# Patient Record
Sex: Male | Born: 1965
Health system: Southern US, Community
[De-identification: ages and names within clinical notes are randomized; demographics above are authoritative.]

## PROBLEM LIST (undated history)

## (undated) DIAGNOSIS — K449 Diaphragmatic hernia without obstruction or gangrene: Secondary | ICD-10-CM

## (undated) DIAGNOSIS — K219 Gastro-esophageal reflux disease without esophagitis: Secondary | ICD-10-CM

## (undated) DIAGNOSIS — E785 Hyperlipidemia, unspecified: Secondary | ICD-10-CM

## (undated) DIAGNOSIS — D126 Benign neoplasm of colon, unspecified: Secondary | ICD-10-CM

## (undated) DIAGNOSIS — I10 Essential (primary) hypertension: Secondary | ICD-10-CM

## (undated) DIAGNOSIS — K5790 Diverticulosis of intestine, part unspecified, without perforation or abscess without bleeding: Secondary | ICD-10-CM

## (undated) HISTORY — PX: KNEE ARTHROPLASTY: SHX992

## (undated) HISTORY — DX: Hyperlipidemia, unspecified: E78.5

## (undated) HISTORY — DX: Gastro-esophageal reflux disease without esophagitis: K21.9

## (undated) HISTORY — DX: Essential (primary) hypertension: I10

## (undated) HISTORY — DX: Benign neoplasm of colon, unspecified: D12.6

## (undated) HISTORY — DX: Diaphragmatic hernia without obstruction or gangrene: K44.9

## (undated) HISTORY — DX: Diverticulosis of intestine, part unspecified, without perforation or abscess without bleeding: K57.90

## (undated) HISTORY — PX: TOTAL KNEE ARTHROPLASTY: SHX125

---

## 2004-10-14 ENCOUNTER — Ambulatory Visit: Payer: Self-pay | Admitting: Gastroenterology

## 2004-11-27 ENCOUNTER — Ambulatory Visit (HOSPITAL_COMMUNITY): Admission: RE | Admit: 2004-11-27 | Discharge: 2004-11-27 | Payer: Self-pay | Admitting: Internal Medicine

## 2005-10-10 ENCOUNTER — Encounter: Admission: RE | Admit: 2005-10-10 | Discharge: 2005-12-11 | Payer: Self-pay | Admitting: Family Medicine

## 2008-07-08 ENCOUNTER — Encounter: Payer: Self-pay | Admitting: Family Medicine

## 2010-04-29 ENCOUNTER — Telehealth (INDEPENDENT_AMBULATORY_CARE_PROVIDER_SITE_OTHER): Payer: Self-pay | Admitting: *Deleted

## 2010-04-29 ENCOUNTER — Encounter (INDEPENDENT_AMBULATORY_CARE_PROVIDER_SITE_OTHER): Payer: Self-pay | Admitting: *Deleted

## 2010-06-05 ENCOUNTER — Ambulatory Visit: Payer: Self-pay | Admitting: Family Medicine

## 2010-06-13 ENCOUNTER — Emergency Department: Payer: Self-pay | Admitting: Emergency Medicine

## 2010-07-21 ENCOUNTER — Ambulatory Visit: Payer: Self-pay | Admitting: Family Medicine

## 2010-07-21 DIAGNOSIS — E669 Obesity, unspecified: Secondary | ICD-10-CM | POA: Insufficient documentation

## 2010-07-21 DIAGNOSIS — I10 Essential (primary) hypertension: Secondary | ICD-10-CM | POA: Insufficient documentation

## 2010-07-21 DIAGNOSIS — N521 Erectile dysfunction due to diseases classified elsewhere: Secondary | ICD-10-CM

## 2010-07-21 DIAGNOSIS — E7800 Pure hypercholesterolemia, unspecified: Secondary | ICD-10-CM | POA: Insufficient documentation

## 2010-07-21 DIAGNOSIS — E1159 Type 2 diabetes mellitus with other circulatory complications: Secondary | ICD-10-CM | POA: Insufficient documentation

## 2010-07-28 ENCOUNTER — Ambulatory Visit: Payer: Self-pay | Admitting: Family Medicine

## 2010-08-04 LAB — CONVERTED CEMR LAB
ALT: 19 units/L (ref 0–53)
Albumin: 4.2 g/dL (ref 3.5–5.2)
Basophils Relative: 0.3 % (ref 0.0–3.0)
Calcium: 9.1 mg/dL (ref 8.4–10.5)
Cholesterol: 201 mg/dL — ABNORMAL HIGH (ref 0–200)
Creatinine,U: 154.5 mg/dL
Direct LDL: 143.9 mg/dL
Eosinophils Relative: 3.9 % (ref 0.0–5.0)
GFR calc non Af Amer: 145.3 mL/min (ref >60)
Glucose, Bld: 136 mg/dL — ABNORMAL HIGH (ref 70–99)
HCT: 40.9 % (ref 39.0–52.0)
HDL: 51.7 mg/dL (ref >39.00)
Hemoglobin: 13.7 g/dL (ref 13.0–17.0)
Hgb A1c MFr Bld: 7 % — ABNORMAL HIGH (ref 4.6–6.5)
Lymphocytes Relative: 32.3 % (ref 12.0–46.0)
Lymphs Abs: 2.7 10*3/uL (ref 0.7–4.0)
Microalb, Ur: 3.1 mg/dL — ABNORMAL HIGH (ref 0.0–1.9)
Monocytes Relative: 8.9 % (ref 3.0–12.0)
Neutro Abs: 4.5 10*3/uL (ref 1.4–7.7)
Potassium: 4.7 meq/L (ref 3.5–5.1)
RBC: 4.42 M/uL (ref 4.22–5.81)
Sodium: 143 meq/L (ref 135–145)
Total Bilirubin: 0.5 mg/dL (ref 0.3–1.2)
Total Protein: 7.1 g/dL (ref 6.0–8.3)
Triglycerides: 46 mg/dL (ref 0.0–149.0)
WBC: 8.3 10*3/uL (ref 4.5–10.5)

## 2010-10-20 ENCOUNTER — Ambulatory Visit: Payer: Self-pay | Admitting: Family Medicine

## 2010-10-20 LAB — CONVERTED CEMR LAB
ALT: 26 units/L (ref 0–53)
Alkaline Phosphatase: 92 units/L (ref 39–117)
Bilirubin, Direct: 0.1 mg/dL (ref 0.0–0.3)
Cholesterol: 186 mg/dL (ref 0–200)
LDL Cholesterol: 127 mg/dL — ABNORMAL HIGH (ref 0–99)
Total Bilirubin: 0.6 mg/dL (ref 0.3–1.2)
Total Protein: 6.8 g/dL (ref 6.0–8.3)
VLDL: 6 mg/dL (ref 0.0–40.0)

## 2011-01-13 NOTE — Miscellaneous (Signed)
Summary: [facility]  [facility]   Imported By: Lanelle Bal 08/09/2010 08:01:13  _____________________________________________________________________  External Attachment:    Type:   Image     Comment:   External Document

## 2011-01-13 NOTE — Assessment & Plan Note (Signed)
Summary: NEW PT TO ESTABH/DLO   Vital Signs:  Patient profile:   45 year old male Height:      69 inches Weight:      261.4 pounds BMI:     38.74 Temp:     99.1 degrees F oral Pulse rate:   72 / minute Pulse rhythm:   regular BP sitting:   140 / 82  (left arm) Cuff size:   regular  Vitals Entered By: Benny Lennert CMA Duncan Dull) (July 21, 2010 1:58 PM)  History of Present Illness: Chief complaint new patient to be established   DM: as below, blood sugars were approximately 500,  Lantus was titrated up by Dr. Patsy Lager at [facility], now he is doing the best he is done in years  HTN: Currently not at goal. Tolerating lisinopril easily  Lipidsreportedly does have  hyperlipidemia, but not currently on medication.  Obesity, approaching 300 pounds  was urinating a lot, dm was out of control.  was using the bathroom, about thirty times.  500 bs, on sixty units. Doig fantastic.  Checks bs about every day   ROS: as above, urination has improved, no  fever or chills, or nausea. Feeling very good. no cp, sob. Otherwise, the pertinent positives and negatives are listed above and in the HPI, otherwise a full review of systems has been reviewed and is negative unless noted positive.   Preventive Screening-Counseling & Management  Alcohol-Tobacco     Smoking Status: never  Allergies (verified): No Known Drug Allergies  Past History:  Past Medical History: HYPERCHOLESTEROLEMIA (ICD-272.0) HYPERTENSION (ICD-401.9) DM (ICD-250.00)    Past Surgical History: none  Family History: Family History Diabetes 1st degree relative Family History Hypertension  Social History: Occupation: Married Never Smoked Occupation:  employed Smoking Status:  never  Physical Exam  General:  Well-developed,well-nourished,in no acute distress; alert,appropriate and cooperative throughout examination, obese Head:  Normocephalic and atraumatic without obvious abnormalities. No apparent alopecia or  balding. Ears:  no external deformities.   Nose:  no external deformity.   Mouth:  Oral mucosa and oropharynx without lesions or exudates.  Teeth in good repair. Neck:  No deformities, masses, or tenderness noted. Chest Wall:  No deformities, masses, tenderness or gynecomastia noted. Lungs:  Normal respiratory effort, chest expands symmetrically. Lungs are clear to auscultation, no crackles or wheezes. Heart:  Normal rate and regular rhythm. S1 and S2 normal without gallop, murmur, click, rub or other extra sounds. Extremities:  No clubbing, cyanosis, edema, or deformity noted with normal full range of motion of all joints.   Neurologic:  alert & oriented X3 and gait normal.   Cervical Nodes:  No lymphadenopathy noted Psych:  Cognition and judgment appear intact. Alert and cooperative with normal attention span and concentration. No apparent delusions, illusions, hallucinations   Impression & Recommendations:  Problem # 1:  DM (ICD-250.00) Assessment New Continue with Lantus 60 units nightly  His updated medication list for this problem includes:    Metformin Hcl 1000 Mg Tabs (Metformin hcl) .Marland Kitchen... Take on e tablet 2 times daily    Lisinopril 40 Mg Tabs (Lisinopril) ..... Once dialy    Lantus Solostar 100 Unit/ml Soln (Insulin glargine) .Marland Kitchen... 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 Once dialy  Amlodipine Besylate 5 Mg Tabs (Amlodipine besylate) .Marland Kitchen... 1 by mouth every day  Problem # 3:  HYPERCHOLESTEROLEMIA (ICD-272.0) Assessment: New check  Problem # 4:  OBESITY (ICD-278.00) Assessment: New Work on your diet: Try to eat minimal fatty foods, plenty of fiber, fruit, and vegetables. Exercise is incredibly important to heart health: Try to  exercise for at least 30 minutes 5-6 times a week.   Complete Medication List: 1)  Metformin Hcl 1000 Mg Tabs (Metformin hcl) .... Take on e tablet 2 times daily 2)  Lisinopril 40 Mg Tabs (Lisinopril) .... Once dialy 3)  Prilosec 40 Mg Cpdr (Omeprazole) .... One tablet daily 4)  Lantus Solostar 100 Unit/ml Soln (Insulin glargine) .... Give 100 units [place] each night, dispense 1 full month's supply 5)  Amlodipine Besylate 5 Mg Tabs (Amlodipine besylate) .Marland Kitchen.. 1 by mouth every day  Patient Instructions: 1)  LABS 2)  BMP, HFP, FLP, CBC with diff, TSH, PSA:272.4, 250.00, v58.69, v76.44 3)  HgBA1c prior to visit  ICD-9: 250.00 4)  Urine Microalbumin prior to visit ICD-9 : 250.00 5)  FOLLOW-UP 3 MONTHS Prescriptions: AMLODIPINE BESYLATE 5 MG  TABS (AMLODIPINE BESYLATE) 1 by mouth every day  #90 x 3   Entered and Authorized by:   Hannah Beat MD   Signed by:   Hannah Beat MD on 07/21/2010   Method used:   Print then Give to Patient   RxID:   [id number] HYDROCHLOROTHIAZIDE 12.5 MG  TABS (HYDROCHLOROTHIAZIDE) Take 1 tab  by mouth every morning  #90 x 3   Entered and Authorized by:   Hannah Beat MD   Signed by:   Hannah Beat MD on 07/21/2010   Method used:   Print then Give to Patient   RxID:   [id number] LANTUS SOLOSTAR 100 UNIT/ML SOLN (INSULIN GLARGINE) Give 100 units  each night, dispense 1 full month's supply  #1 x 5   Entered and Authorized by:   Hannah Beat MD   Signed by:   Hannah Beat MD on 07/21/2010   Method used:   Print then Give to Patient   RxID:   [id number] PRILOSEC 40 MG CPDR (OMEPRAZOLE) one tablet daily  #90 x 3   Entered and Authorized by:   Hannah Beat MD   Signed by:   Hannah Beat MD on 07/21/2010   Method used:   Print then Give to Patient   RxID:   [id number] LISINOPRIL 40 MG TABS (LISINOPRIL) once dialy  #180 x 3   Entered and Authorized by:   Hannah Beat MD   Signed by:   Hannah Beat MD on 07/21/2010    Method used:   Print then Give to Patient   RxID:   [id number] METFORMIN HCL 1000 MG TABS (METFORMIN HCL) take on e tablet 2 times daily  #180 x 3   Entered and Authorized by:   Hannah Beat MD   Signed by:   Hannah Beat MD on 07/21/2010   Method used:   Print then Give to Patient   RxID:   [id number]   Current Allergies (reviewed today): No known allergies

## 2011-01-13 NOTE — Letter (Signed)
Summary: Records Dated 03-29-07 thru 01-06-10/[facility]  Records Dated 03-29-07 thru 01-06-10/[facility]   Imported By: Lanelle Bal 08/09/2010 08:08:13  _____________________________________________________________________  External Attachment:    Type:   Image     Comment:   External Document

## 2011-01-13 NOTE — Letter (Signed)
Summary: Primary Care Appointment Letter  [place] [facility]  [address] [place], [place] [address]   Phone: [phone]  Fax: [phone]    04/29/2010 MRN: [id number]  NANCY MANUELE [address] [place], [place]  [address]  Dear Mr. Hartfield,   Your Primary Care Physician  has indicated that:    _______it is time to schedule an appointment.    _______you missed your appointment on______ and need to call and          reschedule.    _______you need to have lab work done.    _______you need to schedule an appointment discuss lab or test results.    __**____We have rescheduled the time (only) of your appointment to 2:00pm. The will remain the same August 19,2011.    Please call our office as soon as possible. Our phone number is [phone]          _________. Please press option 1. Our office is open 8a-12noon and 1p-5p, Monday through Friday.     Thank you,    [facility]

## 2011-01-13 NOTE — Progress Notes (Signed)
Summary: Rescheduled New pt appt  Phone Note Outgoing Call   Summary of Call: Called pt left message rescheduling appt time from 9am, to 2:00pm, sent ltr of appt rescheduled.Daine Gip  Apr 29, 2010 10:57 AM  Initial call taken by: Daine Gip,  Apr 29, 2010 10:57 AM

## 2011-02-02 ENCOUNTER — Encounter: Payer: Self-pay | Admitting: Family Medicine

## 2011-02-11 ENCOUNTER — Encounter (INDEPENDENT_AMBULATORY_CARE_PROVIDER_SITE_OTHER): Payer: Self-pay | Admitting: *Deleted

## 2011-02-17 NOTE — Letter (Signed)
Summary: [employer]   Imported By: Kassie Mends 02/10/2011 10:57:06  _____________________________________________________________________  External Attachment:    Type:   Image     Comment:   External Document  Appended Document: [employer] Letter received communication from his insurance  way overdue for CPX schedule in future  Appended Document: [employer] Letter Letter mailed to patient to schedule appt in the near future.Consuello Masse CMA

## 2011-02-17 NOTE — Letter (Signed)
Summary: Generic Letter  [place] [facility]  [address] [place], [place] [address]   Phone: [phone]  Fax: [phone]    02/11/2011     Eric Frederick [address] [place], [place]  [address]    Dear Mr. Stavely,   We received communication from his insurance you are way over due for Physical. Please call and schedule in the near future.    Sincerely,   Benny Lennert CMA (AAMA)

## 2011-02-22 ENCOUNTER — Encounter: Payer: Self-pay | Admitting: Family Medicine

## 2011-02-22 ENCOUNTER — Telehealth (INDEPENDENT_AMBULATORY_CARE_PROVIDER_SITE_OTHER): Payer: Self-pay | Admitting: *Deleted

## 2011-02-23 ENCOUNTER — Other Ambulatory Visit (INDEPENDENT_AMBULATORY_CARE_PROVIDER_SITE_OTHER): Payer: 59

## 2011-02-23 ENCOUNTER — Encounter: Payer: Self-pay | Admitting: Family Medicine

## 2011-02-23 ENCOUNTER — Other Ambulatory Visit: Payer: Self-pay | Admitting: Family Medicine

## 2011-02-23 DIAGNOSIS — E669 Obesity, unspecified: Secondary | ICD-10-CM

## 2011-02-23 DIAGNOSIS — Z79899 Other long term (current) drug therapy: Secondary | ICD-10-CM

## 2011-02-23 DIAGNOSIS — Z125 Encounter for screening for malignant neoplasm of prostate: Secondary | ICD-10-CM

## 2011-02-23 DIAGNOSIS — E119 Type 2 diabetes mellitus without complications: Secondary | ICD-10-CM

## 2011-02-23 LAB — PSA: PSA: 0.94 ng/mL (ref 0.10–4.00)

## 2011-02-23 LAB — CBC WITH DIFFERENTIAL/PLATELET
Basophils Relative: 0.3 % (ref 0.0–3.0)
Eosinophils Absolute: 0.4 10*3/uL (ref 0.0–0.7)
Eosinophils Relative: 5.1 % — ABNORMAL HIGH (ref 0.0–5.0)
HCT: 44 % (ref 39.0–52.0)
Lymphs Abs: 2.9 10*3/uL (ref 0.7–4.0)
MCHC: 33.2 g/dL (ref 30.0–36.0)
MCV: 90.1 fl (ref 78.0–100.0)
Monocytes Absolute: 0.8 10*3/uL (ref 0.1–1.0)
Neutrophils Relative %: 48.7 % (ref 43.0–77.0)
RBC: 4.89 Mil/uL (ref 4.22–5.81)
WBC: 8.1 10*3/uL (ref 4.5–10.5)

## 2011-02-23 LAB — MICROALBUMIN / CREATININE URINE RATIO: Creatinine,U: 154.2 mg/dL

## 2011-03-01 NOTE — Progress Notes (Signed)
----   Converted from flag ---- ---- 02/21/2011 6:14 PM, Hannah Beat MD wrote: no, non-compliant DM 2. hgba1c, microalbumin: 250.00 PSA: v76.44 cbc with diff, v58.69 [we can use all others from 10/2010]  ---- 02/21/2011 10:32 AM, Liane Comber CMA (AAMA) wrote: This pt had his cpx labs Nov 11 (canceled cpx appt) he is scheduled for cpx labs this wed. Do you want to use the labs from Nov and cancel this lab or draw him? Thanks Tasha ------------------------------

## 2011-03-02 ENCOUNTER — Encounter: Payer: Self-pay | Admitting: Family Medicine

## 2011-03-02 ENCOUNTER — Ambulatory Visit (INDEPENDENT_AMBULATORY_CARE_PROVIDER_SITE_OTHER): Payer: 59 | Admitting: Family Medicine

## 2011-03-02 DIAGNOSIS — N529 Male erectile dysfunction, unspecified: Secondary | ICD-10-CM

## 2011-03-02 DIAGNOSIS — I10 Essential (primary) hypertension: Secondary | ICD-10-CM

## 2011-03-02 DIAGNOSIS — E7800 Pure hypercholesterolemia, unspecified: Secondary | ICD-10-CM

## 2011-03-02 DIAGNOSIS — E1169 Type 2 diabetes mellitus with other specified complication: Secondary | ICD-10-CM | POA: Insufficient documentation

## 2011-03-02 DIAGNOSIS — E669 Obesity, unspecified: Secondary | ICD-10-CM

## 2011-03-02 DIAGNOSIS — N521 Erectile dysfunction due to diseases classified elsewhere: Secondary | ICD-10-CM | POA: Insufficient documentation

## 2011-03-02 DIAGNOSIS — E119 Type 2 diabetes mellitus without complications: Secondary | ICD-10-CM

## 2011-03-02 DIAGNOSIS — Z0000 Encounter for general adult medical examination without abnormal findings: Secondary | ICD-10-CM

## 2011-03-02 DIAGNOSIS — R21 Rash and other nonspecific skin eruption: Secondary | ICD-10-CM

## 2011-03-02 MED ORDER — METOPROLOL TARTRATE 25 MG PO TABS: 25.0000 mg | ORAL_TABLET | Freq: Two times a day (BID) | ORAL | Status: DC

## 2011-03-02 MED ORDER — INSULIN GLARGINE 100 UNIT/ML ~~LOC~~ SOLN: 100.0000 [IU] | Freq: Every day | SUBCUTANEOUS | Status: DC

## 2011-03-02 MED ORDER — METFORMIN HCL 1000 MG PO TABS: 1000.0000 mg | ORAL_TABLET | Freq: Two times a day (BID) | ORAL | Status: DC

## 2011-03-02 MED ORDER — TRIAMCINOLONE ACETONIDE 0.1 % EX CREA: TOPICAL_CREAM | Freq: Two times a day (BID) | CUTANEOUS | Status: AC

## 2011-03-02 MED ORDER — LISINOPRIL 40 MG PO TABS: 40.0000 mg | ORAL_TABLET | Freq: Every day | ORAL | Status: DC

## 2011-03-02 MED ORDER — OMEPRAZOLE 40 MG PO CPDR: 40.0000 mg | DELAYED_RELEASE_CAPSULE | Freq: Every day | ORAL | Status: DC

## 2011-03-02 MED ORDER — TADALAFIL 20 MG PO TABS: 20.0000 mg | ORAL_TABLET | Freq: Every day | ORAL | Status: AC | PRN

## 2011-03-02 MED ORDER — PRAVASTATIN SODIUM 10 MG PO TABS: ORAL_TABLET | ORAL | Status: DC

## 2011-03-02 NOTE — Assessment & Plan Note (Signed)
Fairly good control, continue with lantus. 60 units qhs

## 2011-03-02 NOTE — Assessment & Plan Note (Signed)
cialis prn 

## 2011-03-02 NOTE — Assessment & Plan Note (Signed)
Add lopressor, d/c norvasc

## 2011-03-02 NOTE — Assessment & Plan Note (Signed)
TAC prn

## 2011-03-02 NOTE — Assessment & Plan Note (Signed)
The patient is asked to make an attempt to improve diet and exercise patterns to aid in medical management of this problem.  

## 2011-03-02 NOTE — Progress Notes (Signed)
Patient presents for CPX, but also has many medication and problem-focused questions:  CPX: morbidly obese, no exercise, no ETOH, no tobacco. Faithful to wife, no concern for STDS  Lab Results  Component Value Date   HGBA1C 5.9 02/23/2011   Lab Results  Component Value Date   CHOL 186 10/20/2010   CHOL 201* 07/28/2010   Lab Results  Component Value Date   HDL 53.10 10/20/2010   HDL 16.10 07/28/2010   Lab Results  Component Value Date   LDLCALC 127* 10/20/2010   Lab Results  Component Value Date   TRIG 30.0 10/20/2010   TRIG 46.0 07/28/2010   Lab Results  Component Value Date   CHOLHDL 4 10/20/2010   CHOLHDL 4 07/28/2010   Lab Results  Component Value Date   PSA 0.94 02/23/2011   PSA 1.25 07/28/2010   Lab Results  Component Value Date   ALT 26 10/20/2010   AST 19 10/20/2010   ALKPHOS 92 10/20/2010   BILITOT 0.6 10/20/2010   Lab Results  Component Value Date   WBC 8.1 02/23/2011   HGB 14.6 02/23/2011   HCT 44.0 02/23/2011   MCV 90.1 02/23/2011   PLT 301.0 02/23/2011   All reviewed with the patient.  HTN: goal of 130/80 Norvasc, had a lot of urination. Could not tolerate with going to the bathroom with his job. Tol ACE inh  Lipids: Also, simvistatin, had some rash. Had some muscles and joint aches. Crestor - also caught a rash and got some joint aching.   DM: Dm, 60-70 u a1c < 6. No hypoglycemia. No problems with medicine. Eating poorly.  Fatigue: Has questions about thyroid: Lab Results  Component Value Date   TSH 0.74 07/28/2010  normal  Has decreased energy compared to when he wsa younger. Also has some decreased sex drive compared.  Wonders about testosterone level, discussed.  Recent rash: Ketoconazole tablets, has had tinea versicolor in the past Keep triamcinalone. - helped with most recent rash  The PMH, PSH, Social History, Family History, Medications, and allergies have been reviewed in [place], and have been updated if relevant.   General: Denies  fever, chills, sweats. No significant weight loss. FATIGUE AS ABOVE Eyes: Denies blurring,significant itching ENT: Denies earache, sore throat, and hoarseness.  Cardiovascular: Denies chest pains, palpitations, dyspnea on exertion,  Respiratory: Denies cough, dyspnea at rest,wheeezing Breast: no concerns about lumps GI: Denies nausea, vomiting, diarrhea, constipation, change in bowel habits, abdominal pain, melena, hematochezia GU: AS ABOVE Musculoskeletal: Denies back pain, joint pain Derm: ABOVE Neuro: Denies  paresthesias, frequent falls, frequent headaches Psych: Denies depression, anxiety Endocrine: Denies cold intolerance, heat intolerance, polydipsia Heme: Denies enlarged lymph nodes Allergy: No hayfever Otherwise, the pertinent positives and negatives are listed above and in the HPI, otherwise a full review of systems has been reviewed and is negative unless noted positive.   PE: GEN: well developed, well nourished, no acute distress Eyes: conjunctiva and lids normal, PERRLA, EOMI ENT: TM clear, nares clear, oral exam WNL Neck: supple, no lymphadenopathy, no thyromegaly, no JVD Pulm: clear to auscultation and percussion, respiratory effort normal CV: regular rate and rhythm, S1-S2, no murmur, rub or gallop, no bruits, peripheral pulses normal and symmetric, no cyanosis, clubbing, edema or varicosities Chest: no scars, masses, no gynecomastia   GI: soft, non-tender; no hepatosplenomegaly, masses; active bowel sounds all quadrants GU: no hernia, testicular mass, penile discharge, priapism or prostate enlargement Lymph: no cervical, axillary or inguinal adenopathy MSK: gait normal, muscle tone and strength  WNL, no joint swelling, effusions, discoloration, crepitus  SKIN: clear, good turgor, color WNL, no rashes, lesions, or ulcerations Neuro: normal mental status, normal strength, sensation, and motion Psych: alert; oriented to person, place and time, normally interactive and not  anxious or depressed in appearance.

## 2011-03-02 NOTE — Assessment & Plan Note (Signed)
Not at goal, low dose pravastatin Recheck 3 mo

## 2011-03-02 NOTE — Patient Instructions (Signed)
Recheck in 3 months Recheck BP and new meds

## 2011-03-02 NOTE — Assessment & Plan Note (Signed)
The patient's preventative maintenance and recommended screening tests for an annual wellness exam were reviewed in full today. Brought up to date unless services declined.  Counselled on the importance of diet, exercise, and its role in overall health and mortality. The patient's FH and SH was reviewed, including their home life, tobacco status, and drug and alcohol status.    

## 2011-06-01 ENCOUNTER — Ambulatory Visit: Payer: 59 | Admitting: Family Medicine

## 2011-12-21 ENCOUNTER — Other Ambulatory Visit: Payer: Self-pay | Admitting: Family Medicine

## 2012-03-06 ENCOUNTER — Other Ambulatory Visit: Payer: Self-pay | Admitting: *Deleted

## 2012-03-06 MED ORDER — INSULIN GLARGINE 100 UNIT/ML ~~LOC~~ SOLN: SUBCUTANEOUS | Status: DC

## 2012-03-06 MED ORDER — INSULIN PEN NEEDLE 31G X 8 MM MISC: 2.0000 [IU] | Freq: Two times a day (BID) | Status: DC

## 2012-03-14 ENCOUNTER — Encounter: Payer: Self-pay | Admitting: Family Medicine

## 2012-03-14 ENCOUNTER — Ambulatory Visit (INDEPENDENT_AMBULATORY_CARE_PROVIDER_SITE_OTHER): Payer: 59 | Admitting: Family Medicine

## 2012-03-14 VITALS — BP 140/80 | HR 100 | Temp 98.9°F | Ht 69.0 in | Wt 295.1 lb

## 2012-03-14 DIAGNOSIS — E7800 Pure hypercholesterolemia, unspecified: Secondary | ICD-10-CM

## 2012-03-14 DIAGNOSIS — Z91199 Patient's noncompliance with other medical treatment and regimen due to unspecified reason: Secondary | ICD-10-CM | POA: Insufficient documentation

## 2012-03-14 DIAGNOSIS — Z79899 Other long term (current) drug therapy: Secondary | ICD-10-CM

## 2012-03-14 DIAGNOSIS — I10 Essential (primary) hypertension: Secondary | ICD-10-CM

## 2012-03-14 DIAGNOSIS — E119 Type 2 diabetes mellitus without complications: Secondary | ICD-10-CM

## 2012-03-14 DIAGNOSIS — Z9119 Patient's noncompliance with other medical treatment and regimen: Secondary | ICD-10-CM

## 2012-03-14 LAB — CBC WITH DIFFERENTIAL/PLATELET
Basophils Relative: 0.2 % (ref 0.0–3.0)
Eosinophils Absolute: 0.4 10*3/uL (ref 0.0–0.7)
Eosinophils Relative: 4.6 % (ref 0.0–5.0)
HCT: 45.7 % (ref 39.0–52.0)
Lymphs Abs: 2.2 10*3/uL (ref 0.7–4.0)
MCHC: 32.3 g/dL (ref 30.0–36.0)
MCV: 89.6 fl (ref 78.0–100.0)
Monocytes Absolute: 0.8 10*3/uL (ref 0.1–1.0)
Neutrophils Relative %: 58.6 % (ref 43.0–77.0)
RBC: 5.11 Mil/uL (ref 4.22–5.81)
WBC: 8.1 10*3/uL (ref 4.5–10.5)

## 2012-03-14 LAB — HEPATIC FUNCTION PANEL
ALT: 24 U/L (ref 0–53)
Albumin: 4 g/dL (ref 3.5–5.2)
Bilirubin, Direct: 0 mg/dL (ref 0.0–0.3)
Total Protein: 7.1 g/dL (ref 6.0–8.3)

## 2012-03-14 LAB — MICROALBUMIN / CREATININE URINE RATIO
Creatinine,U: 111.9 mg/dL
Microalb Creat Ratio: 0.8 mg/g (ref 0.0–30.0)
Microalb, Ur: 0.9 mg/dL (ref 0.0–1.9)

## 2012-03-14 LAB — BASIC METABOLIC PANEL
BUN: 15 mg/dL (ref 6–23)
CO2: 26 mEq/L (ref 19–32)
Chloride: 102 mEq/L (ref 96–112)
Creatinine, Ser: 0.8 mg/dL (ref 0.4–1.5)
Potassium: 4.8 mEq/L (ref 3.5–5.1)

## 2012-03-14 LAB — LIPID PANEL
HDL: 41.5 mg/dL (ref >39.00)
Total CHOL/HDL Ratio: 5
VLDL: 11.8 mg/dL (ref 0.0–40.0)

## 2012-03-14 MED ORDER — METOPROLOL TARTRATE 25 MG PO TABS: 25.0000 mg | ORAL_TABLET | Freq: Two times a day (BID) | ORAL | Status: DC

## 2012-03-14 MED ORDER — INSULIN GLARGINE 100 UNIT/ML ~~LOC~~ SOLN: SUBCUTANEOUS | Status: DC

## 2012-03-14 MED ORDER — INSULIN PEN NEEDLE 31G X 8 MM MISC: 2.0000 [IU] | Freq: Two times a day (BID) | Status: DC

## 2012-03-14 MED ORDER — OMEPRAZOLE 40 MG PO CPDR: 40.0000 mg | DELAYED_RELEASE_CAPSULE | Freq: Every day | ORAL | Status: DC

## 2012-03-14 MED ORDER — LISINOPRIL 40 MG PO TABS: 40.0000 mg | ORAL_TABLET | Freq: Every day | ORAL | Status: DC

## 2012-03-14 MED ORDER — METFORMIN HCL 1000 MG PO TABS: 1000.0000 mg | ORAL_TABLET | Freq: Two times a day (BID) | ORAL | Status: DC

## 2012-03-14 NOTE — Progress Notes (Signed)
Patient Name: Eric Frederick Date of Birth: 29-Dec-1965 Medical Record Number: [id number] Gender: male Date of Encounter: 03/14/2012  History of Present Illness:  Eric Frederick is a 46 y.o. very pleasant male patient who presents with the following:  DM: metf 1000 bid Lantus 70 units 2 packs a month  Diabetes Mellitus: Tolerating Medications: yes Compliance with diet: poor Exercise: none Avg blood sugars at home: ok Foot problems: none Hypoglycemia: none No nausea, vomitting, blurred vision, polyuria.  Lab Results  Component Value Date   HGBA1C 7.1* 03/14/2012    Wt Readings from Last 3 Encounters:  03/14/12 295 lb 1.9 oz (133.866 kg)  03/02/11 275 lb (124.739 kg)  07/21/10 261 lb 6.4 oz (118.57 kg)    Body mass index is 43.58 kg/(m^2).  HTN: not at goal, had some urination with metoprolol No CP, no sob. No HA.  BP Readings from Last 3 Encounters:  03/14/12 140/80  03/02/11 140/88  07/21/10 140/82    Basic Metabolic Panel:    Component Value Date/Time   NA 140 03/14/2012 0905   K 4.8 03/14/2012 0905   CL 102 03/14/2012 0905   CO2 26 03/14/2012 0905   BUN 15 03/14/2012 0905   CREATININE 0.8 03/14/2012 0905   GLUCOSE 146* 03/14/2012 0905   CALCIUM 9.1 03/14/2012 0905    Lipids: Could not tolerate even low dose statin  Lipids:    Component Value Date/Time   CHOL 201* 03/14/2012 0905   TRIG 59.0 03/14/2012 0905   HDL 41.50 03/14/2012 0905   LDLDIRECT 156.0 03/14/2012 0905   VLDL 11.8 03/14/2012 0905   CHOLHDL 5 03/14/2012 0905    Lab Results  Component Value Date   ALT 24 03/14/2012   AST 15 03/14/2012   ALKPHOS 97 03/14/2012   BILITOT 0.5 03/14/2012   Body mass index is 43.58 kg/(m^2).  Patient Active Problem List  Diagnoses  . DM  . HYPERCHOLESTEROLEMIA  . OBESITY  . HYPERTENSION  . Routine general medical examination at a health care facility  . Erectile dysfunction  . Rash   Past Medical History  Diagnosis Date  . Diabetes mellitus   . Hyperlipidemia   .  Hypertension    No past surgical history on file. History  Substance Use Topics  . Smoking status: Former Smoker    Types: Cigarettes    Quit date: 03/02/1991  . Smokeless tobacco: Not on file  . Alcohol Use: Not on file   No family history on file. No Known Allergies  Medication list has been reviewed and updated.  Review of Systems:  GEN: No acute illnesses, no fevers, chills. GI: No n/v/d, eating normally Pulm: No SOB Interactive and getting along well at home.  Otherwise, ROS is as per the HPI.   Physical Examination: Filed Vitals:   03/14/12 0816  BP: 140/80  Pulse: 100  Temp: 98.9 F (37.2 C)  TempSrc: Oral  Height: 5\' 9"  (1.753 m)  Weight: 295 lb 1.9 oz (133.866 kg)  SpO2: 98%    Body mass index is 43.58 kg/(m^2).   GEN: WDWN, NAD, Non-toxic, A & O x 3 HEENT: Atraumatic, Normocephalic. Neck supple. No masses, No LAD. Ears and Nose: No external deformity. CV: RRR, No M/G/R. No JVD. No thrill. No extra heart sounds. PULM: CTA B, no wheezes, crackles, rhonchi. No retractions. No resp. distress. No accessory muscle use. EXTR: No c/c/e NEURO Normal gait.  PSYCH: Normally interactive. Conversant. Not depressed or anxious appearing.  Calm demeanor.  Assessment and Plan:  1. DM  Hemoglobin A1c, Basic metabolic panel, Microalbumin / creatinine urine ratio  2. HYPERCHOLESTEROLEMIA  Lipid panel  3. HYPERTENSION    4. Encounter for long-term (current) use of other medications  CBC with Differential, Hepatic function panel  5. History of noncompliance with medical treatment     Restart bblocker Refilled all meds  Work on diet and exercise  Orders Today: Orders Placed This Encounter  Procedures  . Hemoglobin A1c  . Basic metabolic panel  . CBC with Differential  . Hepatic function panel  . Lipid panel  . Microalbumin / creatinine urine ratio  . LDL cholesterol, direct    Medications Today: Meds ordered this encounter  Medications  . DISCONTD:  metFORMIN (GLUCOPHAGE) 1000 MG tablet    Sig: Take 2,000 mg by mouth 2 (two) times daily with a meal.  . metoprolol tartrate (LOPRESSOR) 25 MG tablet    Sig: Take 1 tablet (25 mg total) by mouth 2 (two) times daily.    Dispense:  60 tablet    Refill:  5  . insulin glargine (LANTUS SOLOSTAR) 100 UNIT/ML injection    Sig: INJECT 100 UNITS SUBCUTANEOUSLY AT BEDTIME    Dispense:  90 mL    Refill:  1    Needs appt before further refills  . Insulin Pen Needle (RELION PEN NEEDLE 31G/8MM) 31G X 8 MM MISC    Sig: Inject 2 Units into the skin 2 (two) times daily. Inject 2 Units into the skin Twice daily.    Dispense:  90 each    Refill:  3  . lisinopril (PRINIVIL,ZESTRIL) 40 MG tablet    Sig: Take 1 tablet (40 mg total) by mouth daily.    Dispense:  90 tablet    Refill:  1  . metFORMIN (GLUCOPHAGE) 1000 MG tablet    Sig: Take 1 tablet (1,000 mg total) by mouth 2 (two) times daily with a meal.    Dispense:  180 tablet    Refill:  1  . omeprazole (PRILOSEC) 40 MG capsule    Sig: Take 1 capsule (40 mg total) by mouth daily.    Dispense:  180 capsule    Refill:  3

## 2012-03-14 NOTE — Patient Instructions (Signed)
Recheck 6 months

## 2012-03-15 ENCOUNTER — Encounter: Payer: Self-pay | Admitting: *Deleted

## 2012-06-21 ENCOUNTER — Encounter: Payer: Self-pay | Admitting: Gastroenterology

## 2012-09-12 ENCOUNTER — Encounter: Payer: Self-pay | Admitting: Family Medicine

## 2012-09-12 ENCOUNTER — Ambulatory Visit (INDEPENDENT_AMBULATORY_CARE_PROVIDER_SITE_OTHER): Payer: 59 | Admitting: Family Medicine

## 2012-09-12 VITALS — BP 168/78 | HR 100 | Temp 98.5°F | Resp 20 | Ht 69.0 in | Wt 296.2 lb

## 2012-09-12 DIAGNOSIS — E669 Obesity, unspecified: Secondary | ICD-10-CM

## 2012-09-12 DIAGNOSIS — E7800 Pure hypercholesterolemia, unspecified: Secondary | ICD-10-CM

## 2012-09-12 DIAGNOSIS — B360 Pityriasis versicolor: Secondary | ICD-10-CM

## 2012-09-12 DIAGNOSIS — Z23 Encounter for immunization: Secondary | ICD-10-CM

## 2012-09-12 DIAGNOSIS — E119 Type 2 diabetes mellitus without complications: Secondary | ICD-10-CM

## 2012-09-12 DIAGNOSIS — I10 Essential (primary) hypertension: Secondary | ICD-10-CM

## 2012-09-12 LAB — HEMOGLOBIN A1C: Hgb A1c MFr Bld: 7.6 % — ABNORMAL HIGH (ref 4.6–6.5)

## 2012-09-12 MED ORDER — OMEPRAZOLE 40 MG PO CPDR: 40.0000 mg | DELAYED_RELEASE_CAPSULE | Freq: Every day | ORAL | Status: DC

## 2012-09-12 MED ORDER — KETOCONAZOLE 200 MG PO TABS: ORAL_TABLET | ORAL | Status: DC

## 2012-09-12 MED ORDER — METFORMIN HCL 1000 MG PO TABS: 1000.0000 mg | ORAL_TABLET | Freq: Two times a day (BID) | ORAL | Status: DC

## 2012-09-12 MED ORDER — INSULIN PEN NEEDLE 31G X 6 MM MISC: Status: DC

## 2012-09-12 MED ORDER — INSULIN GLARGINE 100 UNIT/ML ~~LOC~~ SOLN: SUBCUTANEOUS | Status: DC

## 2012-09-12 MED ORDER — AMLODIPINE BESYLATE 5 MG PO TABS: 5.0000 mg | ORAL_TABLET | Freq: Every day | ORAL | Status: DC

## 2012-09-12 MED ORDER — LISINOPRIL 40 MG PO TABS: 40.0000 mg | ORAL_TABLET | Freq: Every day | ORAL | Status: DC

## 2012-09-12 MED ORDER — PRAVASTATIN SODIUM 10 MG PO TABS: ORAL_TABLET | ORAL | Status: DC

## 2012-09-12 NOTE — Patient Instructions (Addendum)
F/u 6 months for CPX 

## 2012-09-12 NOTE — Progress Notes (Signed)
Nature conservation officer at [facility] [address] [place] [address] Phone: [phone] Fax: [phone]  Date:  09/12/2012   Name:  Eric Frederick   DOB:  [birth year]   MRN:  [id number] Gender: male Age: 46 y.o.  PCP:  Hannah Beat, MD    Chief Complaint: Follow-up   History of Present Illness:  Eric Frederick is a 46 y.o. pleasant patient who presents with the following:  Wt Readings from Last 3 Encounters:  09/12/12 296 lb 4 oz (134.378 kg)  03/14/12 295 lb 1.9 oz (133.866 kg)  03/02/11 275 lb (124.739 kg)   Ran into an issue with the meoprolol, when in the --- feels like a little groggy  HTN: increased and not at goal D/c his toprol No CP, no sob. No HA.  BP Readings from Last 3 Encounters:  09/12/12 168/78  03/14/12 140/80  03/02/11 140/88    Basic Metabolic Panel:    Component Value Date/Time   NA 140 03/14/2012 0905   K 4.8 03/14/2012 0905   CL 102 03/14/2012 0905   CO2 26 03/14/2012 0905   BUN 15 03/14/2012 0905   CREATININE 0.8 03/14/2012 0905   GLUCOSE 146* 03/14/2012 0905   CALCIUM 9.1 03/14/2012 0905     Lipids: Doing well, stable. Tolerating meds fine with no SE. Tolerating low dose pravachol now Panel reviewed with patient.  Lipids:    Component Value Date/Time   CHOL 201* 03/14/2012 0905   TRIG 59.0 03/14/2012 0905   HDL 41.50 03/14/2012 0905   LDLDIRECT 156.0 03/14/2012 0905   VLDL 11.8 03/14/2012 0905   CHOLHDL 5 03/14/2012 0905    Lab Results  Component Value Date   ALT 24 03/14/2012   AST 15 03/14/2012   ALKPHOS 97 03/14/2012   BILITOT 0.5 03/14/2012   Diabetes Mellitus: Tolerating Medications: yes Compliance with diet: hit or miss Exercise: rare Avg blood sugars at home: "good" Foot problems: none Hypoglycemia: none No nausea, vomitting, blurred vision, polyuria.  Lab Results  Component Value Date   HGBA1C 7.1* 03/14/2012    Wt Readings from Last 3 Encounters:  09/12/12 296 lb 4 oz (134.378 kg)  03/14/12 295 lb 1.9 oz (133.866 kg)    03/02/11 275 lb (124.739 kg)    Body mass index is 43.75 kg/(m^2).   Patient Active Problem List  Diagnosis  . DM  . HYPERCHOLESTEROLEMIA  . OBESITY  . HYPERTENSION  . Erectile dysfunction  . Rash  . History of noncompliance with medical treatment    Past Medical History  Diagnosis Date  . Diabetes mellitus   . Hyperlipidemia   . Hypertension     No past surgical history on file.  History  Substance Use Topics  . Smoking status: Former Smoker    Types: Cigarettes    Quit date: 03/02/1991  . Smokeless tobacco: Not on file  . Alcohol Use: Not on file    No family history on file.  No Known Allergies  Medication list has been reviewed and updated.  Outpatient Prescriptions Prior to Visit  Medication Sig Dispense Refill  . insulin glargine (LANTUS SOLOSTAR) 100 UNIT/ML injection INJECT 100 UNITS SUBCUTANEOUSLY AT BEDTIME  90 mL  1  . Insulin Pen Needle (RELION PEN NEEDLE 31G/8MM) 31G X 8 MM MISC Inject 2 Units into the skin 2 (two) times daily. Inject 2 Units into the skin Twice daily.  90 each  3  . lisinopril (PRINIVIL,ZESTRIL) 40 MG tablet Take 1 tablet (40  mg total) by mouth daily.  90 tablet  1  . metFORMIN (GLUCOPHAGE) 1000 MG tablet Take 1 tablet (1,000 mg total) by mouth 2 (two) times daily with a meal.  180 tablet  1  . metoprolol tartrate (LOPRESSOR) 25 MG tablet Take 1 tablet (25 mg total) by mouth 2 (two) times daily.  60 tablet  5  . omeprazole (PRILOSEC) 40 MG capsule Take 1 capsule (40 mg total) by mouth daily.  180 capsule  3  . pravastatin (PRAVACHOL) 10 MG tablet 1/2 tab po daily  45 tablet  3    Review of Systems:   GEN: No acute illnesses, no fevers, chills. GI: No n/v/d, eating normally Pulm: No SOB Interactive and getting along well at home.  Otherwise, ROS is as per the HPI.   Physical Examination: Filed Vitals:   09/12/12 0806  BP: 168/78  Pulse: 100  Temp: 98.5 F (36.9 C)  Resp: 20   Filed Vitals:   09/12/12 0806  Height:  5\' 9"  (1.753 m)  Weight: 296 lb 4 oz (134.378 kg)   Body mass index is 43.75 kg/(m^2). Ideal Body Weight: Weight in (lb) to have BMI = 25: 168.9    GEN: WDWN, NAD, Non-toxic, A & O x 3 HEENT: Atraumatic, Normocephalic. Neck supple. No masses, No LAD. Ears and Nose: No external deformity. CV: RRR, No M/G/R. No JVD. No thrill. No extra heart sounds. PULM: CTA B, no wheezes, crackles, rhonchi. No retractions. No resp. distress. No accessory muscle use. EXTR: No c/c/e NEURO Normal gait.  PSYCH: Normally interactive. Conversant. Not depressed or anxious appearing.  Calm demeanor.    Assessment and Plan:  1. Need for prophylactic vaccination and inoculation against influenza  Flu vaccine greater than or equal to 3yo preservative free IM  2. DM  insulin glargine (LANTUS SOLOSTAR) 100 UNIT/ML injection, lisinopril (PRINIVIL,ZESTRIL) 40 MG tablet, Hemoglobin A1c  3. HYPERTENSION : d/c lopressor and start norvasc lisinopril (PRINIVIL,ZESTRIL) 40 MG tablet  4. OBESITY    5. HYPERCHOLESTEROLEMIA    6. Tinea versicolor , nizoral    Refill all meds  Orders Today:  Orders Placed This Encounter  Procedures  . Flu vaccine greater than or equal to 3yo preservative free IM  . Hemoglobin A1c    Updated Medication List: (Includes new medications, updates to list, dose adjustments) Meds ordered this encounter  Medications  . amLODipine (NORVASC) 5 MG tablet    Sig: Take 1 tablet (5 mg total) by mouth daily.    Dispense:  90 tablet    Refill:  3  . ketoconazole (NIZORAL) 200 MG tablet    Sig: 2 tabs po once then repeat 2 tabs in 1 week    Dispense:  4 tablet    Refill:  3  . insulin glargine (LANTUS SOLOSTAR) 100 UNIT/ML injection    Sig: INJECT 100 UNITS SUBCUTANEOUSLY AT BEDTIME    Dispense:  90 mL    Refill:  1    Needs appt before further refills  . lisinopril (PRINIVIL,ZESTRIL) 40 MG tablet    Sig: Take 1 tablet (40 mg total) by mouth daily.    Dispense:  90 tablet    Refill:  1    . omeprazole (PRILOSEC) 40 MG capsule    Sig: Take 1 capsule (40 mg total) by mouth daily.    Dispense:  180 capsule    Refill:  3  . metFORMIN (GLUCOPHAGE) 1000 MG tablet    Sig: Take 1 tablet (1,000 mg  total) by mouth 2 (two) times daily with a meal.    Dispense:  180 tablet    Refill:  3  . pravastatin (PRAVACHOL) 10 MG tablet    Sig: 1/2 tab po daily    Dispense:  45 tablet    Refill:  3  . Insulin Pen Needle 31G X 6 MM MISC    Sig: Inject insulin once daily as directed subcutaneous    Dispense:  100 each    Refill:  5    Medications Discontinued: Medications Discontinued During This Encounter  Medication Reason  . metoprolol tartrate (LOPRESSOR) 25 MG tablet   . insulin glargine (LANTUS SOLOSTAR) 100 UNIT/ML injection Reorder  . lisinopril (PRINIVIL,ZESTRIL) 40 MG tablet Reorder  . omeprazole (PRILOSEC) 40 MG capsule Reorder  . metFORMIN (GLUCOPHAGE) 1000 MG tablet Reorder  . pravastatin (PRAVACHOL) 10 MG tablet Reorder  . Insulin Pen Needle (RELION PEN NEEDLE 31G/8MM) 31G X 8 MM MISC      Hannah Beat, MD

## 2012-09-18 ENCOUNTER — Encounter: Payer: Self-pay | Admitting: *Deleted

## 2012-10-29 ENCOUNTER — Observation Stay: Payer: Self-pay | Admitting: Internal Medicine

## 2012-10-29 LAB — COMPREHENSIVE METABOLIC PANEL
Alkaline Phosphatase: 110 U/L (ref 50–136)
BUN: 13 mg/dL (ref 7–18)
Bilirubin,Total: 0.9 mg/dL (ref 0.2–1.0)
Co2: 26 mmol/L (ref 21–32)
Creatinine: 0.91 mg/dL (ref 0.60–1.30)
EGFR (Non-African Amer.): >60
Osmolality: 280 (ref 275–301)
Potassium: 3.4 mmol/L — ABNORMAL LOW (ref 3.5–5.1)
SGOT(AST): 35 U/L (ref 15–37)
SGPT (ALT): 45 U/L (ref 12–78)

## 2012-10-29 LAB — LIPASE, BLOOD: Lipase: 89 U/L (ref 73–393)

## 2012-10-29 LAB — URINALYSIS, COMPLETE
Leukocyte Esterase: NEGATIVE
Nitrite: NEGATIVE
Ph: 5 (ref 4.5–8.0)
Protein: 30
RBC,UR: 1 /HPF (ref 0–5)

## 2012-10-29 LAB — CBC WITH DIFFERENTIAL/PLATELET
Basophil #: 0.2 10*3/uL — ABNORMAL HIGH (ref 0.0–0.1)
Basophil %: 2 %
HCT: 42.7 % (ref 40.0–52.0)
Lymphocyte #: 0.3 10*3/uL — ABNORMAL LOW (ref 1.0–3.6)
Lymphocyte %: 3.5 %
MCHC: 32.8 g/dL (ref 32.0–36.0)
MCV: 87 fL (ref 80–100)
Monocyte %: 4.5 %
Neutrophil #: 8 10*3/uL — ABNORMAL HIGH (ref 1.4–6.5)
Platelet: 242 10*3/uL (ref 150–440)
RDW: 13.7 % (ref 11.5–14.5)
WBC: 8.9 10*3/uL (ref 3.8–10.6)

## 2012-10-29 LAB — TROPONIN I: Troponin-I: 0.02 ng/mL

## 2012-10-29 LAB — RAPID INFLUENZA A&B ANTIGENS

## 2012-11-03 LAB — CULTURE, BLOOD (SINGLE)

## 2012-12-12 HISTORY — PX: SHOULDER ARTHROSCOPY W/ ROTATOR CUFF REPAIR: SHX2400

## 2012-12-22 ENCOUNTER — Emergency Department: Payer: Self-pay | Admitting: Emergency Medicine

## 2013-03-13 ENCOUNTER — Ambulatory Visit (INDEPENDENT_AMBULATORY_CARE_PROVIDER_SITE_OTHER)
Admission: RE | Admit: 2013-03-13 | Discharge: 2013-03-13 | Disposition: A | Discharge status: Home / Self Care | Payer: 59 | Source: Ambulatory Visit | Attending: Family Medicine | Admitting: Family Medicine

## 2013-03-13 ENCOUNTER — Ambulatory Visit (INDEPENDENT_AMBULATORY_CARE_PROVIDER_SITE_OTHER): Payer: 59 | Admitting: Family Medicine

## 2013-03-13 ENCOUNTER — Ambulatory Visit: Payer: 59 | Admitting: Family Medicine

## 2013-03-13 ENCOUNTER — Encounter: Payer: Self-pay | Admitting: Family Medicine

## 2013-03-13 VITALS — BP 134/80 | HR 105 | Temp 98.3°F | Ht 69.0 in | Wt 290.8 lb

## 2013-03-13 DIAGNOSIS — Z79899 Other long term (current) drug therapy: Secondary | ICD-10-CM

## 2013-03-13 DIAGNOSIS — E119 Type 2 diabetes mellitus without complications: Secondary | ICD-10-CM

## 2013-03-13 DIAGNOSIS — M25519 Pain in unspecified shoulder: Secondary | ICD-10-CM

## 2013-03-13 DIAGNOSIS — B360 Pityriasis versicolor: Secondary | ICD-10-CM

## 2013-03-13 DIAGNOSIS — M25511 Pain in right shoulder: Secondary | ICD-10-CM

## 2013-03-13 DIAGNOSIS — E7800 Pure hypercholesterolemia, unspecified: Secondary | ICD-10-CM

## 2013-03-13 DIAGNOSIS — I10 Essential (primary) hypertension: Secondary | ICD-10-CM

## 2013-03-13 DIAGNOSIS — Z23 Encounter for immunization: Secondary | ICD-10-CM

## 2013-03-13 LAB — CBC WITH DIFFERENTIAL/PLATELET
Basophils Relative: 0.2 % (ref 0.0–3.0)
Eosinophils Relative: 2.3 % (ref 0.0–5.0)
HCT: 44.7 % (ref 39.0–52.0)
Hemoglobin: 14.5 g/dL (ref 13.0–17.0)
Lymphocytes Relative: 29.3 % (ref 12.0–46.0)
Lymphs Abs: 2.2 10*3/uL (ref 0.7–4.0)
Monocytes Relative: 8.5 % (ref 3.0–12.0)
Neutro Abs: 4.5 10*3/uL (ref 1.4–7.7)
RBC: 5.11 Mil/uL (ref 4.22–5.81)
WBC: 7.5 10*3/uL (ref 4.5–10.5)

## 2013-03-13 LAB — BASIC METABOLIC PANEL
Calcium: 9 mg/dL (ref 8.4–10.5)
GFR: 163.57 mL/min (ref >60.00)
Glucose, Bld: 164 mg/dL — ABNORMAL HIGH (ref 70–99)
Potassium: 4.2 mEq/L (ref 3.5–5.1)
Sodium: 137 mEq/L (ref 135–145)

## 2013-03-13 LAB — MICROALBUMIN / CREATININE URINE RATIO
Creatinine,U: 130.2 mg/dL
Microalb Creat Ratio: 1.9 mg/g (ref 0.0–30.0)
Microalb, Ur: 2.5 mg/dL — ABNORMAL HIGH (ref 0.0–1.9)

## 2013-03-13 MED ORDER — INSULIN GLARGINE 100 UNIT/ML ~~LOC~~ SOLN: SUBCUTANEOUS | Status: DC

## 2013-03-13 MED ORDER — FLUCONAZOLE 200 MG PO TABS: ORAL_TABLET | ORAL | Status: DC

## 2013-03-13 NOTE — Progress Notes (Signed)
Nature conservation officer at [facility] [address] [place] [address] Phone: [phone] Fax: [phone]  Date:  03/13/2013   Name:  Eric Frederick   DOB:  Dec 29, 1965   MRN:  [id number] Gender: male Age: 47 y.o.  Primary Physician:  Hannah Beat, MD  Evaluating MD: Hannah Beat, MD   Chief Complaint: Follow-up   History of Present Illness:  AZEL GUMINA is a 47 y.o. pleasant patient who presents with the following:  DM, HTN, Lipids  Pneumovax Tdap  Sugar has been doing OK, sometimes not compliant with diet.  Feels good now.   Diabetes Mellitus: Tolerating Medications: yes Compliance with diet: fair-poor Exercise: no Foot problems: none Hypoglycemia: none No nausea, vomitting, blurred vision, polyuria.  Lab Results  Component Value Date   HGBA1C 7.6* 09/12/2012    Wt Readings from Last 3 Encounters:  03/13/13 290 lb 12 oz (131.883 kg)  09/12/12 296 lb 4 oz (134.378 kg)  03/14/12 295 lb 1.9 oz (133.866 kg)    Body mass index is 42.92 kg/(m^2).   DM eye exam, yearly.  Tinea Versicolor: ketoconazole worked ok  HTN: Tolerating all medications without side effects Stable and at goal No CP, no sob. No HA.  BP Readings from Last 3 Encounters:  03/13/13 134/80  09/12/12 168/78  03/14/12 140/80    Basic Metabolic Panel:    Component Value Date/Time   NA 140 03/14/2012 0905   K 4.8 03/14/2012 0905   CL 102 03/14/2012 0905   CO2 26 03/14/2012 0905   BUN 15 03/14/2012 0905   CREATININE 0.8 03/14/2012 0905   GLUCOSE 146* 03/14/2012 0905   CALCIUM 9.1 03/14/2012 0905     Lipids: Could not tolerate 5 mg pravachol  Lipids:    Component Value Date/Time   CHOL 201* 03/14/2012 0905   TRIG 59.0 03/14/2012 0905   HDL 41.50 03/14/2012 0905   LDLDIRECT 156.0 03/14/2012 0905   VLDL 11.8 03/14/2012 0905   CHOLHDL 5 03/14/2012 0905    Lab Results  Component Value Date   ALT 24 03/14/2012   AST 15 03/14/2012   ALKPHOS 97 03/14/2012   BILITOT 0.5 03/14/2012    The  patient noted above presents with shoulder pain that has been ongoing for 4 months.  The patient denies neck pain or radicular symptoms. Denies dislocation, subluxation, separation of the shoulder. The patient does complain of pain in the overhead plane, but minimally able to abduct shoulder now.  Had one incident in December, was doing some stuff in the garage, and was lifting stuff off of the shelf. And noticed that his right arm was bothering him a lot, wiith cutting. Went to the ER - [facility].  Bringing down and felt like pulled or strained something.    Medications Tried: Advil, allege Ice or Heat: yes Tried PT: No  Prior shoulder Injury: No Prior surgery: No Prior fracture: No  Handedness: RHD    Patient Active Problem List  Diagnosis  . DM  . HYPERCHOLESTEROLEMIA  . OBESITY  . HYPERTENSION  . Erectile dysfunction  . History of noncompliance with medical treatment  . Tinea versicolor    Past Medical History  Diagnosis Date  . Diabetes mellitus   . Hyperlipidemia   . Hypertension     No past surgical history on file.  History   Social History  . Marital Status: Married    Spouse Name: N/A    Number of Children: N/A  . Years of  Education: N/A   Occupational History  . Not on file.   Social History Main Topics  . Smoking status: Former Smoker    Types: Cigarettes    Quit date: 03/02/1991  . Smokeless tobacco: Not on file  . Alcohol Use: Not on file  . Drug Use: Not on file  . Sexually Active: Not on file   Other Topics Concern  . Not on file   Social History Narrative  . No narrative on file    No family history on file.  No Known Allergies  Medication list has been reviewed and updated.  Outpatient Prescriptions Prior to Visit  Medication Sig Dispense Refill  . amLODipine (NORVASC) 5 MG tablet Take 1 tablet (5 mg total) by mouth daily.  90 tablet  3  . insulin glargine (LANTUS SOLOSTAR) 100 UNIT/ML injection INJECT 100 UNITS  SUBCUTANEOUSLY AT BEDTIME  90 mL  1  . Insulin Pen Needle 31G X 6 MM MISC Inject insulin once daily as directed subcutaneous  100 each  5  . ketoconazole (NIZORAL) 200 MG tablet 2 tabs po once then repeat 2 tabs in 1 week  4 tablet  3  . lisinopril (PRINIVIL,ZESTRIL) 40 MG tablet Take 1 tablet (40 mg total) by mouth daily.  90 tablet  1  . metFORMIN (GLUCOPHAGE) 1000 MG tablet Take 1 tablet (1,000 mg total) by mouth 2 (two) times daily with a meal.  180 tablet  3  . omeprazole (PRILOSEC) 40 MG capsule Take 1 capsule (40 mg total) by mouth daily.  180 capsule  3  . pravastatin (PRAVACHOL) 10 MG tablet 1/2 tab po daily  45 tablet  3   No facility-administered medications prior to visit.    Review of Systems:   GEN: No acute illnesses, no fevers, chills. GI: No n/v/d, eating normally Pulm: No SOB Interactive and getting along well at home. Otherwise, the pertinent positives and negatives are listed above and in the HPI, otherwise a full review of systems has been reviewed and is negative unless noted positive.   Physical Examination: BP 134/80  Pulse 105  Temp(Src) 98.3 F (36.8 C) (Oral)  Ht 5\' 9"  (1.753 m)  Wt 290 lb 12 oz (131.883 kg)  BMI 42.92 kg/m2  SpO2 95%  Ideal Body Weight: Weight in (lb) to have BMI = 25: 168.9   GEN: WDWN, NAD, Non-toxic, A & O x 3 HEENT: Atraumatic, Normocephalic. Neck supple. No masses, No LAD. Ears and Nose: No external deformity. CV: RRR, No M/G/R. No JVD. No thrill. No extra heart sounds. PULM: CTA B, no wheezes, crackles, rhonchi. No retractions. No resp. distress. No accessory muscle use. EXTR: No c/c/e NEURO Normal gait.  PSYCH: Normally interactive. Conversant. Not depressed or anxious appearing.  Calm demeanor.    Shoulder: R Inspection: No muscle wasting or winging Ecchymosis/edema: neg  AC joint, scapula, clavicle: NT Cervical spine: NT, full ROM Spurling's: neg Abduction: first 20 deg 4/5, unable to actively abduct beyond 30 deg.  1/5 str, full passive rom Flexion: full, 2/5 IR, full, lift-off: 5/5 ER at neutral: full, 5/5 AC crossover and compression: neg Neer: neg Hawkins: neg Drop Test: CLEARLY POSITIVE ON THE RIGHT Empty Can: N/A Supraspinatus insertion: NT Bicipital groove: NT Speed's: neg Yergason's: neg Sulcus sign: neg Scapular dyskinesis: MARKED WITH ATTEMPTED ABDUCTION ON R C5-T1 intact Sensation intact Grip 5/5  Dg Shoulder Right  03/13/2013  *RADIOLOGY REPORT*  Clinical Data: Right shoulder pain.  Concern for rotator cuff tear  RIGHT  SHOULDER - 2+ VIEW  Comparison: None.  Findings: Negative for fracture.  The alignment is normal.  There is narrowing of the acromiohumeral distance leading to impingement of the rotator cuff.  There is degenerative change and spurring of the [facility] joint which could contribute to rotator cuff impingement.  No rib lesion.  IMPRESSION: Rotator cuff impingement anatomy.  No acute bony abnormality.   Original Report Authenticated By: Janeece Riggers, M.D.     Assessment and Plan:  DM - Plan: insulin glargine (LANTUS SOLOSTAR) 100 UNIT/ML injection, Basic metabolic panel, Hemoglobin A1c, Microalbumin / creatinine urine ratio  Right shoulder pain - Plan: DG Shoulder Right, MR Shoulder Right Wo Contrast  HYPERTENSION  HYPERCHOLESTEROLEMIA - Plan: LDL cholesterol, direct  Encounter for long-term (current) use of other medications - Plan: CBC with Differential  Immunization due - Plan: Tdap vaccine greater than or equal to 7yo IM, Pneumococcal polysaccharide vaccine 23-valent greater than or equal to 2yo subcutaneous/IM  Tinea versicolor  >40 minutes spent in face to face time with patient, >50% spent in counselling or coordination of care: multiple ongoing problems.   DM, refill lantus, check a1c, microalbumin HTN: stable Lipids: unable to tol statin, discussed risks Tinea Versicolor: different antifungal trial  R shoulder: clinical concern for R sided supraspinatus tear  in a patient with a clearly positive drop test, markedly altered shoulder mechanics who cannot actively abduct his arm. Obtain an MRI of the R shoulder to evaluate for full thickness rotator cuff tear.  Orders Today:  Orders Placed This Encounter  Procedures  . DG Shoulder Right    Standing Status: Future     Number of Occurrences: 1     Standing Expiration Date: 05/13/2014    Order Specific Question:  Preferred imaging location?    Answer:  [facility]    Order Specific Question:  Reason for exam:    Answer:  Right shoulder pain, concern for RTC tear  . MR Shoulder Right Wo Contrast    290 LBS/NOT CLAUS OPEN UNIT DO TO WEIGHT/NO NEEDS INS [employer]/SM/EPIC ORDER SCHEDULED W/[facility]    Standing Status: Future     Number of Occurrences:      Standing Expiration Date: 05/13/2014    Order Specific Question:  Does the patient have a pacemaker, internal devices, implants, aneury    Answer:  No    Order Specific Question:  Preferred imaging location?    Answer:  [id number]    Order Specific Question:  Reason for exam:    Answer:  Clinical concern for right rotator cuff tear, injury 11/2013  . Tdap vaccine greater than or equal to 7yo IM  . Pneumococcal polysaccharide vaccine 23-valent greater than or equal to 2yo subcutaneous/IM  . Basic metabolic panel  . CBC with Differential  . LDL cholesterol, direct  . Hemoglobin A1c  . Microalbumin / creatinine urine ratio    Updated Medication List: (Includes new medications, updates to list, dose adjustments) Meds ordered this encounter  Medications  . fluconazole (DIFLUCAN) 200 MG tablet    Sig: 1 po once a week    Dispense:  12 tablet    Refill:  0  . insulin glargine (LANTUS SOLOSTAR) 100 UNIT/ML injection    Sig: INJECT 100 UNITS SUBCUTANEOUSLY AT BEDTIME    Dispense:  90 mL    Refill:  1    Medications Discontinued: Medications Discontinued During This Encounter  Medication Reason  . pravastatin (PRAVACHOL) 10 MG tablet    . ketoconazole (NIZORAL)  200 MG tablet   . insulin glargine (LANTUS SOLOSTAR) 100 UNIT/ML injection Reorder      Signed, Yancarlos Berthold T. Tilia Faso, MD 03/13/2013 9:43 AM

## 2013-03-13 NOTE — Patient Instructions (Addendum)
REFERRAL: GO THE THE FRONT ROOM AT THE ENTRANCE OF OUR CLINIC, NEAR CHECK IN. ASK FOR Eric Frederick. SHE WILL HELP YOU SET UP YOUR REFERRAL. DATE: TIME:  

## 2013-03-14 ENCOUNTER — Ambulatory Visit: Payer: 59 | Admitting: Family Medicine

## 2013-03-14 ENCOUNTER — Encounter: Payer: Self-pay | Admitting: *Deleted

## 2013-03-20 ENCOUNTER — Ambulatory Visit
Admission: RE | Admit: 2013-03-20 | Discharge: 2013-03-20 | Disposition: A | Discharge status: Home / Self Care | Payer: 59 | Source: Ambulatory Visit | Attending: Family Medicine | Admitting: Family Medicine

## 2013-03-20 ENCOUNTER — Other Ambulatory Visit: Payer: Self-pay | Admitting: Family Medicine

## 2013-03-20 DIAGNOSIS — M75121 Complete rotator cuff tear or rupture of right shoulder, not specified as traumatic: Secondary | ICD-10-CM

## 2013-03-20 DIAGNOSIS — M25511 Pain in right shoulder: Secondary | ICD-10-CM

## 2013-05-03 ENCOUNTER — Other Ambulatory Visit: Payer: Self-pay | Admitting: Family Medicine

## 2013-05-03 NOTE — Telephone Encounter (Signed)
Pt left v/m requesting refill lisinopril # 90 x 1 to [facility] [address].pt notified done.

## 2013-06-20 ENCOUNTER — Other Ambulatory Visit: Payer: Self-pay

## 2013-09-18 ENCOUNTER — Other Ambulatory Visit: Payer: Self-pay | Admitting: Family Medicine

## 2013-09-18 NOTE — Telephone Encounter (Signed)
Last office visit 03/13/2013.  Ok to refill? 

## 2013-10-23 ENCOUNTER — Other Ambulatory Visit: Payer: Self-pay | Admitting: Family Medicine

## 2013-10-23 NOTE — Telephone Encounter (Signed)
Last office visit 03/13/2013.  Ok to refill?

## 2013-12-15 ENCOUNTER — Other Ambulatory Visit: Payer: Self-pay | Admitting: Family Medicine

## 2013-12-15 NOTE — Telephone Encounter (Signed)
Last office visit 03/14/2013.  Ok to refill?

## 2014-02-03 ENCOUNTER — Other Ambulatory Visit: Payer: Self-pay | Admitting: *Deleted

## 2014-02-03 MED ORDER — METFORMIN HCL 1000 MG PO TABS: ORAL_TABLET | ORAL | Status: DC

## 2014-02-03 NOTE — Telephone Encounter (Signed)
Received faxed refill request from pharmacy. Refill sent to pharmacy electronically. 

## 2014-03-10 ENCOUNTER — Other Ambulatory Visit: Payer: Self-pay

## 2014-03-10 MED ORDER — LISINOPRIL 40 MG PO TABS: ORAL_TABLET | ORAL | Status: DC

## 2014-03-10 MED ORDER — METFORMIN HCL 1000 MG PO TABS: ORAL_TABLET | ORAL | Status: DC

## 2014-03-10 NOTE — Telephone Encounter (Signed)
Eric Frederick request refill metformin and lisinopril to [facility] [address]. Pt has appt 03/12/14. Advised done.

## 2014-03-12 ENCOUNTER — Telehealth: Payer: Self-pay | Admitting: Family Medicine

## 2014-03-12 ENCOUNTER — Ambulatory Visit (INDEPENDENT_AMBULATORY_CARE_PROVIDER_SITE_OTHER): Payer: 59 | Admitting: Family Medicine

## 2014-03-12 ENCOUNTER — Encounter: Payer: Self-pay | Admitting: Family Medicine

## 2014-03-12 VITALS — BP 136/90 | HR 107 | Temp 98.3°F | Ht 65.5 in | Wt 284.5 lb

## 2014-03-12 DIAGNOSIS — M25562 Pain in left knee: Secondary | ICD-10-CM

## 2014-03-12 DIAGNOSIS — M25569 Pain in unspecified knee: Secondary | ICD-10-CM

## 2014-03-12 DIAGNOSIS — I10 Essential (primary) hypertension: Secondary | ICD-10-CM

## 2014-03-12 DIAGNOSIS — E119 Type 2 diabetes mellitus without complications: Secondary | ICD-10-CM

## 2014-03-12 DIAGNOSIS — Z125 Encounter for screening for malignant neoplasm of prostate: Secondary | ICD-10-CM

## 2014-03-12 DIAGNOSIS — E7800 Pure hypercholesterolemia, unspecified: Secondary | ICD-10-CM

## 2014-03-12 DIAGNOSIS — Z79899 Other long term (current) drug therapy: Secondary | ICD-10-CM

## 2014-03-12 DIAGNOSIS — Z13220 Encounter for screening for lipoid disorders: Secondary | ICD-10-CM

## 2014-03-12 MED ORDER — LISINOPRIL 40 MG PO TABS: ORAL_TABLET | ORAL | Status: DC

## 2014-03-12 MED ORDER — AMLODIPINE BESYLATE 5 MG PO TABS: ORAL_TABLET | ORAL | Status: DC

## 2014-03-12 MED ORDER — OMEPRAZOLE 40 MG PO CPDR: DELAYED_RELEASE_CAPSULE | ORAL | Status: DC

## 2014-03-12 MED ORDER — INSULIN GLARGINE 100 UNIT/ML ~~LOC~~ SOLN: SUBCUTANEOUS | Status: DC

## 2014-03-12 MED ORDER — METFORMIN HCL 1000 MG PO TABS: ORAL_TABLET | ORAL | Status: DC

## 2014-03-12 NOTE — Progress Notes (Signed)
Date:  03/12/2014   Name:  Eric Frederick   DOB:  1966-09-28   MRN:  [id number]  Primary Physician:  Owens Loffler, MD   Chief Complaint: Medication Refill   Subjective:   History of Present Illness:  Eric Frederick is a 48 y.o. very pleasant male patient who presents with the following:  Diabetes Mellitus: Tolerating Medications: yes Compliance with diet: fair Exercise: minimal / intermittent Avg blood sugars at home: not checking Foot problems: none Hypoglycemia: none No nausea, vomitting, blurred vision, polyuria.  Lab Results  Component Value Date   HGBA1C 7.1* 03/12/2014   HGBA1C 7.4* 03/13/2013   HGBA1C 7.6* 09/12/2012   Lab Results  Component Value Date   MICROALBUR 2.5* 03/13/2013   LDLCALC 127* 10/20/2010   CREATININE 0.7 03/12/2014    Wt Readings from Last 3 Encounters:  03/12/14 284 lb 8 oz (129.048 kg)  03/13/13 290 lb 12 oz (131.883 kg)  09/12/12 296 lb 4 oz (134.378 kg)    Body mass index is 46.61 kg/(m^2).   HTN: Tolerating all medications without side effects Stable and at goal No CP, no sob. No HA.  BP Readings from Last 3 Encounters:  03/12/14 136/90  03/13/13 134/80  09/12/12 409/81    Basic Metabolic Panel:    Component Value Date/Time   NA 138 03/12/2014 1619   K 4.9 03/12/2014 1619   CL 100 03/12/2014 1619   CO2 25 03/12/2014 1619   BUN 12 03/12/2014 1619   CREATININE 0.7 03/12/2014 1619   GLUCOSE 145* 03/12/2014 1619   CALCIUM 9.9 03/12/2014 1619     Lipids: Doing well, stable. Tolerating meds fine with no SE. Panel reviewed with patient.  Lipids:    Component Value Date/Time   CHOL 201* 03/14/2012 0905   TRIG 59.0 03/14/2012 0905   HDL 41.50 03/14/2012 0905   LDLDIRECT 144.3 03/12/2014 1619   VLDL 11.8 03/14/2012 0905   CHOLHDL 5 03/14/2012 0905    Lab Results  Component Value Date   ALT 22 03/12/2014   AST 15 03/12/2014   ALKPHOS 106 03/12/2014   BILITOT 0.8 03/12/2014    Weight continues to be high.  Body mass index is 46.61 kg/(m^2).   Past  Medical History, Surgical History, Social History, Family History, Problem List, Medications, and Allergies have been reviewed and updated if relevant.  Review of Systems:  GEN: No acute illnesses, no fevers, chills. GI: No n/v/d, eating normally Pulm: No SOB Interactive and getting along well at home.  Otherwise, ROS is as per the HPI.  Objective:   Physical Examination: BP 136/90  Pulse 107  Temp(Src) 98.3 F (36.8 C) (Oral)  Ht 5' 5.5" (1.664 m)  Wt 284 lb 8 oz (129.048 kg)  BMI 46.61 kg/m2   GEN: WDWN, NAD, Non-toxic, A & O x 3 HEENT: Atraumatic, Normocephalic. Neck supple. No masses, No LAD. Ears and Nose: No external deformity. CV: RRR, No M/G/R. No JVD. No thrill. No extra heart sounds. PULM: CTA B, no wheezes, crackles, rhonchi. No retractions. No resp. distress. No accessory muscle use. EXTR: No c/c/e NEURO Normal gait.  PSYCH: Normally interactive. Conversant. Not depressed or anxious appearing.  Calm demeanor.   Laboratory and Imaging Data: Results for orders placed in visit on 03/12/14  CBC WITH DIFFERENTIAL      Result Value Ref Range   WBC 9.4  4.5 - 10.5 K/uL   RBC 5.31  4.22 - 5.81 Mil/uL   Hemoglobin 15.2  13.0 -  17.0 g/dL   HCT 46.3  39.0 - 52.0 %   MCV 87.1  78.0 - 100.0 fl   MCHC 32.8  30.0 - 36.0 g/dL   RDW 13.5  11.5 - 14.6 %   Platelets 325.0  150.0 - 400.0 K/uL   Neutrophils Relative % 59.5  43.0 - 77.0 %   Lymphocytes Relative 29.3  12.0 - 46.0 %   Monocytes Relative 8.3  3.0 - 12.0 %   Eosinophils Relative 2.6  0.0 - 5.0 %   Basophils Relative 0.3  0.0 - 3.0 %   Neutro Abs 5.6  1.4 - 7.7 K/uL   Lymphs Abs 2.8  0.7 - 4.0 K/uL   Monocytes Absolute 0.8  0.1 - 1.0 K/uL   Eosinophils Absolute 0.2  0.0 - 0.7 K/uL   Basophils Absolute 0.0  0.0 - 0.1 K/uL  HEMOGLOBIN A1C      Result Value Ref Range   Hemoglobin A1C 7.1 (*) 4.6 - 6.5 %  BASIC METABOLIC PANEL      Result Value Ref Range   Sodium 138  135 - 145 mEq/L   Potassium 4.9  3.5 -  5.1 mEq/L   Chloride 100  96 - 112 mEq/L   CO2 25  19 - 32 mEq/L   Glucose, Bld 145 (*) 70 - 99 mg/dL   BUN 12  6 - 23 mg/dL   Creatinine, Ser 0.7  0.4 - 1.5 mg/dL   Calcium 9.9  8.4 - 10.5 mg/dL   GFR 154.85  >60.00 mL/min  HEPATIC FUNCTION PANEL      Result Value Ref Range   Total Bilirubin 0.8  0.3 - 1.2 mg/dL   Bilirubin, Direct 0.1  0.0 - 0.3 mg/dL   Alkaline Phosphatase 106  39 - 117 U/L   AST 15  0 - 37 U/L   ALT 22  0 - 53 U/L   Total Protein 7.4  6.0 - 8.3 g/dL   Albumin 4.5  3.5 - 5.2 g/dL  PSA      Result Value Ref Range   PSA 1.00  0.10 - 4.00 ng/mL  LDL CHOLESTEROL, DIRECT      Result Value Ref Range   Direct LDL 144.3       Assessment & Plan:   Type II or unspecified type diabetes mellitus without mention of complication, not stated as uncontrolled - Plan: Basic metabolic panel, Hemoglobin A1c  Unspecified essential hypertension  Special screening for malignant neoplasm of prostate - Plan: PSA  Screening for lipoid disorders - Plan: LDL cholesterol, direct  Encounter for long-term (current) use of other medications - Plan: CBC with Differential, Hepatic function panel  Left knee pain  DM - Plan: insulin glargine (LANTUS) 100 UNIT/ML injection  HYPERCHOLESTEROLEMIA  DM and HTN stable  Lipids, consider statin.  counselling regarding morbid obesity and diet.  Refer to the patient instructions sections for details of plan shared with patient.   No Follow-up on file.  Orders Placed This Encounter  Procedures  . Basic metabolic panel  . CBC with Differential  . Hemoglobin A1c  . Hepatic function panel  . PSA  . LDL cholesterol, direct  . Basic metabolic panel  . Hepatic function panel  . PSA  . LDL cholesterol, direct   Patient's Medications  New Prescriptions   No medications on file  Previous Medications   INSULIN PEN NEEDLE 31G X 6 MM MISC    Inject insulin once daily as directed subcutaneous  Modified  Medications   Modified Medication  Previous Medication   AMLODIPINE (NORVASC) 5 MG TABLET amLODipine (NORVASC) 5 MG tablet      TAKE ONE TABLET BY MOUTH ONCE DAILY    TAKE ONE TABLET BY MOUTH ONCE DAILY   INSULIN GLARGINE (LANTUS) 100 UNIT/ML INJECTION insulin glargine (LANTUS SOLOSTAR) 100 UNIT/ML injection      INJECT 100 UNITS SUBCUTANEOUSLY AT BEDTIME    INJECT 100 UNITS SUBCUTANEOUSLY AT BEDTIME   LISINOPRIL (PRINIVIL,ZESTRIL) 40 MG TABLET lisinopril (PRINIVIL,ZESTRIL) 40 MG tablet      TAKE ONE TABLET BY MOUTH ONCE DAILY    TAKE ONE TABLET BY MOUTH ONCE DAILY   METFORMIN (GLUCOPHAGE) 1000 MG TABLET metFORMIN (GLUCOPHAGE) 1000 MG tablet      TAKE ONE TABLET BY MOUTH TWICE DAILY WITH A MEAL    TAKE ONE TABLET BY MOUTH TWICE DAILY WITH A MEAL   OMEPRAZOLE (PRILOSEC) 40 MG CAPSULE omeprazole (PRILOSEC) 40 MG capsule      TAKE ONE CAPSULE BY MOUTH EVERY DAY    TAKE ONE CAPSULE BY MOUTH EVERY DAY  Discontinued Medications   FLUCONAZOLE (DIFLUCAN) 200 MG TABLET    1 po once a week   Patient Instructions   BODYHELIX  [email or web address]  Use website instuctions for measurement of limb to determine size.   Look for "Patellar Helix" - this should be placed underneath the kneecap on the affected side and above the bony part at the upper end of your tibia. - It should fit in the soft spot where your patellar tendon is located.  Over the years, I have found that athletes and active people like this product the most with patellar tendonitis. It costs about 40 dollars.  (I have no financial interest in this company and gain nothing from recommending their products)    [facility] Low Glycemic Diet (Source: [facility], 2006)  Low Glycemic Foods (20-49) (Decrease risk of developing heart disease)  Best for Diabetes: Eat Mostly these  Breakfast Cereals: All-Bran All-Bran Fruit 'n Oats Fiber One Oatmeal (not instant) Oat bran  Fruits and fruit juices: (Limit to 1-2 servings per day) Apples  Apricots (fresh & dried) Blackberries Blueberries Cherries Cranberries Peaches Pears Plums Prunes Grapefruit Raspberries Strawberries Tangerine  Juices: Apple juice Grapefruit juice Tomato juice  Beans and legumes (fresh-cooked): Black-eyed peas Butter beans Chick peas Lentils  Green beans Lima beans Kidney beans Navy beans Pinto beans Snow peas  Non-starchy vegetables: Asparagus, avocado, broccoli, cabbage, cauliflower, celery, cucumber, greens, lettuce, mushrooms, peppers, tomatoes, okra, onions, spinach, summer squash  Grains: Barley Bulgur Rye Wild rice  Nuts and oils : Almonds Peanuts Sunflower seeds Hazelnuts Pecans Walnuts Oils that are liquid at room temperature  Dairy, fish, meat, soy, and eggs: Milk, skim Lowfat cheese Yogurt, lowfat, fruit sugar sweetened Lean red meat Fish  Skinless chicken & [place] Shellfish Egg whites (up to 3 daily) Soy products  Egg yolks (up to 7 or _____ per week) Moderate Glycemic Foods (50-69)  OK sometimes with diabetes  Breakfast Cereals: Bran Buds Bran Chex Just Right Mini-Wheats  Special K Swiss muesli  Fruits: Banana (under-ripe) Dates Figs Grapes Kiwi Mango Oranges Raisins  Fruit Juices: Cranberry juice Orange juice  Beans and legumes: [place]-type baked beans Canned pinto, kidney, or navy beans Green peas  Vegetables: Beets Carrots  Sweet potato Yam Corn on the cob  Breads: Pita (pocket) bread Oat bran bread Pumpernickel bread Rye bread Wheat bread, high fiber   Grains: Cornmeal Rice, brown Rice,  white Couscous  Pasta: Macaroni Pizza, cheese Ravioli, meat filled Spaghetti, white   Nuts: Cashews Macadamia  Snacks: Chocolate Ice cream, lowfat Muffin Popcorn High Glycemic Foods (70-100)  Rare: Eat occaisionally with diabetes  THESE ARE THE WORST KIND OF FOODS FOR YOUR DIABETES  Breakfast Cereals: Cheerios Corn Chex Corn Flakes Cream of Wheat Grape Nuts Grape Nut Flakes Grits  Nutri-Grain Puffed Rice Puffed Wheat Rice Chex Rice Krispies Shredded Wheat Team Total  Fruits: Pineapple Watermelon Banana (over-ripe) Beverages: Sodas, sweet tea, pineapple juice  Vegetables: Potato, baked, boiled, fried, mashed [place] fries Canned or frozen corn Parsnips Winter squash  Breads: Most breads (white and whole grain) Bagels Bread sticks Bread stuffing Kaiser roll Dinner rolls  Grains: Rice, instant Tapioca, with milk Candy and most cookies  Snacks: Donuts Corn chips Jelly beans Pretzels Pastries       Signed,  Waylon Koffler T. Siobhan Zaro, MD, [place] [facility] [place] [address] Phone: [phone] Fax: [phone]

## 2014-03-12 NOTE — Patient Instructions (Addendum)
BODYHELIX  [email or web address]  Use website instuctions for measurement of limb to determine size.   Look for "Patellar Helix" - this should be placed underneath the kneecap on the affected side and above the bony part at the upper end of your tibia. - It should fit in the soft spot where your patellar tendon is located.  Over the years, I have found that athletes and active people like this product the most with patellar tendonitis. It costs about 40 dollars.  (I have no financial interest in this company and gain nothing from recommending their products)    [facility] Low Glycemic Diet (Source: [facility], 2006)  Low Glycemic Foods (20-49) (Decrease risk of developing heart disease)  Best for Diabetes: Eat Mostly these  Breakfast Cereals: All-Bran All-Bran Fruit 'n Oats Fiber One Oatmeal (not instant) Oat bran  Fruits and fruit juices: (Limit to 1-2 servings per day) Apples Apricots (fresh & dried) Blackberries Blueberries Cherries Cranberries Peaches Pears Plums Prunes Grapefruit Raspberries Strawberries Tangerine  Juices: Apple juice Grapefruit juice Tomato juice  Beans and legumes (fresh-cooked): Black-eyed peas Butter beans Chick peas Lentils  Green beans Lima beans Kidney beans Navy beans Pinto beans Snow peas  Non-starchy vegetables: Asparagus, avocado, broccoli, cabbage, cauliflower, celery, cucumber, greens, lettuce, mushrooms, peppers, tomatoes, okra, onions, spinach, summer squash  Grains: Barley Bulgur Rye Wild rice  Nuts and oils : Almonds Peanuts Sunflower seeds Hazelnuts Pecans Walnuts Oils that are liquid at room temperature  Dairy, fish, meat, soy, and eggs: Milk, skim Lowfat cheese Yogurt, lowfat, fruit sugar sweetened Lean red meat Fish  Skinless chicken & [place] Shellfish Egg whites (up to 3 daily) Soy products  Egg yolks (up to 7 or _____ per week) Moderate Glycemic Foods (50-69)  OK sometimes with  diabetes  Breakfast Cereals: Bran Buds Bran Chex Just Right Mini-Wheats  Special K Swiss muesli  Fruits: Banana (under-ripe) Dates Figs Grapes Kiwi Mango Oranges Raisins  Fruit Juices: Cranberry juice Orange juice  Beans and legumes: [place]-type baked beans Canned pinto, kidney, or navy beans Green peas  Vegetables: Beets Carrots  Sweet potato Yam Corn on the cob  Breads: Pita (pocket) bread Oat bran bread Pumpernickel bread Rye bread Wheat bread, high fiber   Grains: Cornmeal Rice, brown Rice, white Couscous  Pasta: Macaroni Pizza, cheese Ravioli, meat filled Spaghetti, white   Nuts: Cashews Macadamia  Snacks: Chocolate Ice cream, lowfat Muffin Popcorn High Glycemic Foods (70-100)  Rare: Eat occaisionally with diabetes  THESE ARE THE WORST KIND OF FOODS FOR YOUR DIABETES  Breakfast Cereals: Cheerios Corn Chex Corn Flakes Cream of Wheat Grape Nuts Grape Nut Flakes Grits Nutri-Grain Puffed Rice Puffed Wheat Rice Chex Rice Krispies Shredded Wheat Team Total  Fruits: Pineapple Watermelon Banana (over-ripe) Beverages: Sodas, sweet tea, pineapple juice  Vegetables: Potato, baked, boiled, fried, mashed [place] fries Canned or frozen corn Parsnips Winter squash  Breads: Most breads (white and whole grain) Bagels Bread sticks Bread stuffing Kaiser roll Dinner rolls  Grains: Rice, instant Tapioca, with milk Candy and most cookies  Snacks: Donuts Corn chips Jelly beans Pretzels Pastries

## 2014-03-12 NOTE — Telephone Encounter (Signed)
Relevant patient education assigned to patient using Emmi. ° °

## 2014-03-12 NOTE — Progress Notes (Signed)
Pre visit review using our clinic review tool, if applicable. No additional management support is needed unless otherwise documented below in the visit note. 

## 2014-03-13 ENCOUNTER — Ambulatory Visit: Payer: 59 | Admitting: Family Medicine

## 2014-03-13 LAB — BASIC METABOLIC PANEL
BUN: 12 mg/dL (ref 6–23)
CALCIUM: 9.9 mg/dL (ref 8.4–10.5)
CO2: 25 mEq/L (ref 19–32)
Chloride: 100 mEq/L (ref 96–112)
Creatinine, Ser: 0.7 mg/dL (ref 0.4–1.5)
GFR: 154.85 mL/min (ref >60.00)
Glucose, Bld: 145 mg/dL — ABNORMAL HIGH (ref 70–99)
Potassium: 4.9 mEq/L (ref 3.5–5.1)
Sodium: 138 mEq/L (ref 135–145)

## 2014-03-13 LAB — CBC WITH DIFFERENTIAL/PLATELET
BASOS ABS: 0 10*3/uL (ref 0.0–0.1)
Basophils Relative: 0.3 % (ref 0.0–3.0)
EOS PCT: 2.6 % (ref 0.0–5.0)
Eosinophils Absolute: 0.2 10*3/uL (ref 0.0–0.7)
HEMATOCRIT: 46.3 % (ref 39.0–52.0)
Hemoglobin: 15.2 g/dL (ref 13.0–17.0)
Lymphocytes Relative: 29.3 % (ref 12.0–46.0)
Lymphs Abs: 2.8 10*3/uL (ref 0.7–4.0)
MCHC: 32.8 g/dL (ref 30.0–36.0)
MCV: 87.1 fl (ref 78.0–100.0)
MONOS PCT: 8.3 % (ref 3.0–12.0)
Monocytes Absolute: 0.8 10*3/uL (ref 0.1–1.0)
Neutro Abs: 5.6 10*3/uL (ref 1.4–7.7)
Neutrophils Relative %: 59.5 % (ref 43.0–77.0)
Platelets: 325 10*3/uL (ref 150.0–400.0)
RBC: 5.31 Mil/uL (ref 4.22–5.81)
RDW: 13.5 % (ref 11.5–14.6)
WBC: 9.4 10*3/uL (ref 4.5–10.5)

## 2014-03-13 LAB — HEPATIC FUNCTION PANEL
ALT: 22 U/L (ref 0–53)
AST: 15 U/L (ref 0–37)
Albumin: 4.5 g/dL (ref 3.5–5.2)
Alkaline Phosphatase: 106 U/L (ref 39–117)
Bilirubin, Direct: 0.1 mg/dL (ref 0.0–0.3)
Total Bilirubin: 0.8 mg/dL (ref 0.3–1.2)
Total Protein: 7.4 g/dL (ref 6.0–8.3)

## 2014-03-13 LAB — LDL CHOLESTEROL, DIRECT: LDL DIRECT: 144.3 mg/dL

## 2014-03-13 LAB — PSA: PSA: 1 ng/mL (ref 0.10–4.00)

## 2014-03-13 LAB — HEMOGLOBIN A1C: Hgb A1c MFr Bld: 7.1 % — ABNORMAL HIGH (ref 4.6–6.5)

## 2014-03-17 MED ORDER — PRAVASTATIN SODIUM 10 MG PO TABS: 5.0000 mg | ORAL_TABLET | Freq: Every day | ORAL | Status: DC

## 2014-03-17 NOTE — Addendum Note (Signed)
Addended by: Coover Kitten on: 03/17/2014 08:39 AM   Modules accepted: Orders

## 2014-03-27 ENCOUNTER — Telehealth: Payer: Self-pay

## 2014-03-27 NOTE — Telephone Encounter (Signed)
Relevant patient education assigned to patient using Emmi. ° °

## 2014-06-20 ENCOUNTER — Telehealth: Payer: Self-pay | Admitting: *Deleted

## 2014-06-20 NOTE — Telephone Encounter (Signed)
**  DIABETIC BUNDLE**  Left voicemail letting pt know he is due for a f/u appt (check BP) with a fasting lab appt prior (check lipid and a1c)

## 2014-10-07 ENCOUNTER — Other Ambulatory Visit: Payer: Self-pay | Admitting: Family Medicine

## 2014-10-07 NOTE — Telephone Encounter (Signed)
Last office visit 03/12/2014.  Looks like message was left for Eric Frederick in July to follow up Diabetic Bundle but no appointments were scheduled.  Ok to refill?

## 2014-10-08 NOTE — Telephone Encounter (Signed)
15, 2 ref  F/u DM OV

## 2014-10-15 ENCOUNTER — Other Ambulatory Visit (INDEPENDENT_AMBULATORY_CARE_PROVIDER_SITE_OTHER): Payer: 59

## 2014-10-15 DIAGNOSIS — Z79899 Other long term (current) drug therapy: Secondary | ICD-10-CM

## 2014-10-15 DIAGNOSIS — E785 Hyperlipidemia, unspecified: Secondary | ICD-10-CM

## 2014-10-15 DIAGNOSIS — E119 Type 2 diabetes mellitus without complications: Secondary | ICD-10-CM

## 2014-10-15 LAB — BASIC METABOLIC PANEL
BUN: 14 mg/dL (ref 6–23)
CHLORIDE: 102 meq/L (ref 96–112)
CO2: 28 mEq/L (ref 19–32)
Calcium: 9.1 mg/dL (ref 8.4–10.5)
Creatinine, Ser: 0.8 mg/dL (ref 0.4–1.5)
GFR: 138.37 mL/min (ref >60.00)
Glucose, Bld: 124 mg/dL — ABNORMAL HIGH (ref 70–99)
Potassium: 4.5 mEq/L (ref 3.5–5.1)
Sodium: 137 mEq/L (ref 135–145)

## 2014-10-15 LAB — MICROALBUMIN / CREATININE URINE RATIO
CREATININE, U: 195.6 mg/dL
MICROALB/CREAT RATIO: 0.8 mg/g (ref 0.0–30.0)
Microalb, Ur: 1.6 mg/dL (ref 0.0–1.9)

## 2014-10-15 LAB — HEMOGLOBIN A1C: Hgb A1c MFr Bld: 6.6 % — ABNORMAL HIGH (ref 4.6–6.5)

## 2014-10-15 LAB — LIPID PANEL
CHOLESTEROL: 166 mg/dL (ref 0–200)
HDL: 40.1 mg/dL (ref >39.00)
LDL Cholesterol: 114 mg/dL — ABNORMAL HIGH (ref 0–99)
NONHDL: 125.9
Total CHOL/HDL Ratio: 4
Triglycerides: 59 mg/dL (ref 0.0–149.0)
VLDL: 11.8 mg/dL (ref 0.0–40.0)

## 2014-10-22 ENCOUNTER — Encounter: Payer: Self-pay | Admitting: Family Medicine

## 2014-10-22 ENCOUNTER — Ambulatory Visit (INDEPENDENT_AMBULATORY_CARE_PROVIDER_SITE_OTHER): Payer: 59 | Admitting: Family Medicine

## 2014-10-22 VITALS — BP 144/70 | HR 98 | Temp 98.9°F | Ht 65.5 in | Wt 286.5 lb

## 2014-10-22 DIAGNOSIS — Z23 Encounter for immunization: Secondary | ICD-10-CM

## 2014-10-22 DIAGNOSIS — I10 Essential (primary) hypertension: Secondary | ICD-10-CM

## 2014-10-22 DIAGNOSIS — E1159 Type 2 diabetes mellitus with other circulatory complications: Secondary | ICD-10-CM

## 2014-10-22 DIAGNOSIS — E7800 Pure hypercholesterolemia, unspecified: Secondary | ICD-10-CM

## 2014-10-22 DIAGNOSIS — N521 Erectile dysfunction due to diseases classified elsewhere: Secondary | ICD-10-CM

## 2014-10-22 DIAGNOSIS — L089 Local infection of the skin and subcutaneous tissue, unspecified: Secondary | ICD-10-CM

## 2014-10-22 DIAGNOSIS — L918 Other hypertrophic disorders of the skin: Secondary | ICD-10-CM

## 2014-10-22 MED ORDER — LISINOPRIL 40 MG PO TABS: ORAL_TABLET | ORAL | Status: DC

## 2014-10-22 MED ORDER — KETOCONAZOLE 200 MG PO TABS: ORAL_TABLET | ORAL | Status: DC

## 2014-10-22 MED ORDER — INSULIN GLARGINE 100 UNIT/ML ~~LOC~~ SOLN: SUBCUTANEOUS | Status: DC

## 2014-10-22 MED ORDER — AMLODIPINE BESYLATE 5 MG PO TABS: ORAL_TABLET | ORAL | Status: DC

## 2014-10-22 MED ORDER — PRAVASTATIN SODIUM 10 MG PO TABS: 10.0000 mg | ORAL_TABLET | Freq: Every day | ORAL | Status: DC

## 2014-10-22 MED ORDER — OMEPRAZOLE 40 MG PO CPDR: DELAYED_RELEASE_CAPSULE | ORAL | Status: DC

## 2014-10-22 MED ORDER — METFORMIN HCL 1000 MG PO TABS: ORAL_TABLET | ORAL | Status: DC

## 2014-10-22 NOTE — Progress Notes (Signed)
Pre visit review using our clinic review tool, if applicable. No additional management support is needed unless otherwise documented below in the visit note. 

## 2014-10-22 NOTE — Progress Notes (Signed)
Dr. Frederico Hamman T. Yalitza Teed, MD, [facility] [place], [address] Phone: [phone] Fax: [phone]  10/22/2014  Patient: Eric Frederick, MRN: [id number], DOB: [birth year], 48 y.o.  Primary Physician:  Owens Loffler, MD  Chief Complaint: Follow-up  Subjective:   Eric Frederick is a 48 y.o. very pleasant male patient who presents with the following:  Immunization History  Administered Date(s) Administered  . Influenza Split 09/12/2012  . Pneumococcal Polysaccharide-23 03/13/2013  . Tdap 03/13/2013    Flu Prevnar  R skin tag. Big. R near collarbone, getting irritated on shirt.   Diabetes Mellitus: Tolerating Medications: yes Compliance with diet: fairly good Exercise: minimal / intermittent Avg blood sugars at home: 90-150 Foot problems: none Hypoglycemia: none No nausea, vomitting, blurred vision, polyuria.  Lab Results  Component Value Date   HGBA1C 6.6* 10/15/2014   HGBA1C 7.1* 03/12/2014   HGBA1C 7.4* 03/13/2013   Lab Results  Component Value Date   MICROALBUR 1.6 10/15/2014   LDLCALC 114* 10/15/2014   CREATININE 0.8 10/15/2014    Wt Readings from Last 3 Encounters:  10/22/14 286 lb 8 oz (129.956 kg)  03/12/14 284 lb 8 oz (129.048 kg)  03/13/13 290 lb 12 oz (131.883 kg)    Body mass index is 46.93 kg/(m^2).   Lipids: Doing well, stable. Tolerating meds fine with no SE. Panel reviewed with patient.  Lipids:    Component Value Date/Time   CHOL 166 10/15/2014 0935   TRIG 59.0 10/15/2014 0935   HDL 40.10 10/15/2014 0935   LDLDIRECT 144.3 03/12/2014 1619   VLDL 11.8 10/15/2014 0935   CHOLHDL 4 10/15/2014 0935    Lab Results  Component Value Date   ALT 22 03/12/2014   AST 15 03/12/2014   ALKPHOS 106 03/12/2014   BILITOT 0.8 03/12/2014    HTN: Tolerating all medications without side effects Stable and at goal No CP, no sob. No HA.  BP Readings from Last 3 Encounters:  10/22/14  144/70  03/12/14 136/90  03/13/13 627/03    Basic Metabolic Panel:    Component Value Date/Time   NA 137 10/15/2014 0935   K 4.5 10/15/2014 0935   CL 102 10/15/2014 0935   CO2 28 10/15/2014 0935   BUN 14 10/15/2014 0935   CREATININE 0.8 10/15/2014 0935   GLUCOSE 124* 10/15/2014 0935   CALCIUM 9.1 10/15/2014 0935      Past Medical History, Surgical History, Social History, Family History, Problem List, Medications, and Allergies have been reviewed and updated if relevant.   GEN: No acute illnesses, no fevers, chills. GI: No n/v/d, eating normally Pulm: No SOB Interactive and getting along well at home.  Otherwise, ROS is as per the HPI.  Objective:   BP 144/70 mmHg  Pulse 98  Temp(Src) 98.9 F (37.2 C) (Oral)  Ht 5' 5.5" (1.664 m)  Wt 286 lb 8 oz (129.956 kg)  BMI 46.93 kg/m2  SpO2 97%  GEN: WDWN, NAD, Non-toxic, A & O x 3 HEENT: Atraumatic, Normocephalic. Neck supple. No masses, No LAD. Ears and Nose: No external deformity. CV: RRR, No M/G/R. No JVD. No thrill. No extra heart sounds. PULM: CTA B, no wheezes, crackles, rhonchi. No retractions. No resp. distress. No accessory muscle use. EXTR: No c/c/e NEURO Normal gait.  PSYCH: Normally interactive. Conversant. Not depressed or anxious appearing.  Calm demeanor.   Large irritated skin tag on R shoulder  Laboratory and Imaging Data:  Assessment and Plan:  Type 2 diabetes with circulatory disorder causing erectile dysfunction - Plan: insulin glargine (LANTUS) 100 UNIT/ML injection  Need for prophylactic vaccination and inoculation against influenza - Plan: Flu Vaccine QUAD 36+ mos PF IM (Fluarix Quad PF)  Need for vaccination with 13-polyvalent pneumococcal conjugate vaccine - Plan: Pneumococcal conjugate vaccine 13-valent  Severe obesity (BMI >= 40)  Essential hypertension  HYPERCHOLESTEROLEMIA  Inflamed skin tag  I refilled all the patient's medication. Good control of his diabetes. Encouraged  about diet. Recommended weight loss. Increase pravastatin dosing to 10 mg daily to see if he can tolerate this.  Irritated skin tag. Procedure: Removal of skin tag. The patient was verbally consented, and then he was prepped with alcohol. 1 cc of lidocaine 1% was used for anesthesia. Then plain pickups were used and iris scissors to remove without difficulty. Good hemostasis. No difficulties.  Follow-up: Return in about 6 months (around 04/22/2015).  New Prescriptions   No medications on file   Orders Placed This Encounter  Procedures  . Flu Vaccine QUAD 36+ mos PF IM (Fluarix Quad PF)  . Pneumococcal conjugate vaccine 13-valent    Signed,  Kendarious Gudino T. Om Lizotte, MD   Patient's Medications  New Prescriptions   No medications on file  Previous Medications   INSULIN PEN NEEDLE 31G X 6 MM MISC    Inject insulin once daily as directed subcutaneous  Modified Medications   Modified Medication Previous Medication   AMLODIPINE (NORVASC) 5 MG TABLET amLODipine (NORVASC) 5 MG tablet      TAKE ONE TABLET BY MOUTH ONCE DAILY    TAKE ONE TABLET BY MOUTH ONCE DAILY   INSULIN GLARGINE (LANTUS) 100 UNIT/ML INJECTION insulin glargine (LANTUS) 100 UNIT/ML injection      INJECT 100 UNITS SUBCUTANEOUSLY AT BEDTIME    INJECT 100 UNITS SUBCUTANEOUSLY AT BEDTIME   KETOCONAZOLE (NIZORAL) 200 MG TABLET ketoconazole (NIZORAL) 200 MG tablet      2 tabs po once then repeat 2 tabs in 1 week    2 tabs po once then repeat 2 tabs in 1 week   KETOCONAZOLE (NIZORAL) 200 MG TABLET ketoconazole (NIZORAL) 200 MG tablet      2 tabs po once then repeat 2 tabs in 1 week    2 tabs po once then repeat 2 tabs in 1 week   LISINOPRIL (PRINIVIL,ZESTRIL) 40 MG TABLET lisinopril (PRINIVIL,ZESTRIL) 40 MG tablet      TAKE ONE TABLET BY MOUTH ONCE DAILY    TAKE ONE TABLET BY MOUTH ONCE DAILY   METFORMIN (GLUCOPHAGE) 1000 MG TABLET metFORMIN (GLUCOPHAGE) 1000 MG tablet      TAKE ONE TABLET BY MOUTH TWICE DAILY WITH A MEAL    TAKE  ONE TABLET BY MOUTH TWICE DAILY WITH A MEAL   OMEPRAZOLE (PRILOSEC) 40 MG CAPSULE omeprazole (PRILOSEC) 40 MG capsule      TAKE ONE CAPSULE BY MOUTH EVERY DAY    TAKE ONE CAPSULE BY MOUTH EVERY DAY   PRAVASTATIN (PRAVACHOL) 10 MG TABLET pravastatin (PRAVACHOL) 10 MG tablet      Take 1 tablet (10 mg total) by mouth daily.    TAKE ONE-HALF TABLET BY MOUTH ONCE DAILY  Discontinued Medications   No medications on file

## 2015-03-31 NOTE — Discharge Summary (Signed)
PATIENT NAME:  Eric Frederick, Eric Frederick MR#:  [id number] DATE OF BIRTH:  [birth year]  DATE OF ADMISSION:  10/29/2012 DATE OF DISCHARGE:  10/29/2012  PRIMARY CARE PHYSICIAN: Donella Stade, MD  DISCHARGE DIAGNOSES:  1. Viral gastroenteritis with fever, vomiting, and diarrhea, resolved.  2. Hypertension.  3. Diabetes mellitus, type 2.  DISCHARGE MEDICATIONS:  1. Metformin 1 gram p.o. twice a day. 2. Lisinopril 40 mg p.o. daily.  3. Omeprazole 40 mg daily. 4. Amlodipine 5 mg daily. 5. Lantus 70 units at night.  6. Zofran 4 mg every eight hours p.r.n. for nausea and vomiting, prescription given.   HOSPITAL COURSE: This is a 49 year old African-American who came in because of vomiting, diarrhea, and fever up to 103. The patient also was tachycardic. When he came heart rate was 148 beats per minute and temperature was 103.1. He was admitted to the hospitalist service for severe gastroenteritis. The patient was admitted to the hospitalist service and started on IV fluids. His CBC showed a white count of 8.9, BUN 13, creatinine 0.9, sodium 137, and potassium 3.4. So he got some potassium IV fluids. Stool was not sent because the patient did not have any further bowel movements after the admission. The patient's chest x-ray is clear for any disease and the patient's influenza titers are negative. Lipase is 89. The patient improved remarkably well with fluids and did not have any fever. Blood cultures have been negative. The patient tolerated his lunch. Blood pressure has been 120/63, afebrile at 98.7, at the time of discharge, and he will he really wanted to go home because he was having no nausea, no diarrhea, and fever has been less than 100. The patient is discharged home in stable condition       and advised to come to the ER if he got any spikes of fever or diarrhea. It is thought to be viral gastroenteritis.   TIME SPENT ON DISCHARGE PREPARATION: More than 30 minutes.   ____________________________ Epifanio Lesches, MD sk:slb D: 10/30/2012 14:01:52 ET T: 10/30/2012 14:11:09 ET JOB#: [id number]  cc: Epifanio Lesches, MD, <Dictator> Owens Loffler, MD Epifanio Lesches MD ELECTRONICALLY SIGNED 11/27/2012 14:55

## 2015-03-31 NOTE — H&P (Signed)
PATIENT NAME:  Eric Frederick, Eric Frederick MR#:  [id number] DATE OF BIRTH:  10-07-1966  DATE OF ADMISSION:  10/29/2012  PRIMARY CARE PHYSICIAN: Donella Stade, MD ([facility])  REFERRING PHYSICIAN: Lavonia Drafts, MD   CHIEF COMPLAINT: Vomiting, diarrhea, fever, and tachycardia.   HISTORY OF PRESENT ILLNESS: Eric Frederick is a 49 year old African American male with history of diabetes mellitus type 2, on Lantus, who was in his usual state of health until yesterday morning when he started to have repetitive vomiting associated with several episodes of watery diarrhea without melena or hematochezia. He had fever reaching 103. He got dehydrated and became tachycardic. He reports no abdominal pain apart from mild cramping and his abdomen became sore only after the vomiting. Right now he has no abdominal pain. He denies any cough, no sputum production, and no urinary symptoms.   REVIEW OF SYSTEMS: CONSTITUTIONAL: He reports fever, mild fatigue, and had some chills. EYES: No blurring of vision. No double vision. ENT: No hearing impairment. No sore throat. No dysphagia. CARDIOVASCULAR: No chest pain. No shortness of breath. No syncope. No edema. RESPIRATORY: No cough. No sputum production. No hemoptysis. No chest pain. GASTROINTESTINAL: He had mild crampy abdominal pain and mild soreness feeling, but this has subsided. He had repetitive vomiting and watery diarrhea. No melena. No hematochezia. No hematemesis. GENITOURINARY: No dysuria. No frequency of urination. MUSCULOSKELETAL: No joint pain or swelling. No muscular pain or swelling. INTEGUMENTARY: No skin rash. No ulcers. NEUROLOGY: No focal weakness. No seizure activity. No headache. PSYCHIATRY: No anxiety. No depression. ENDOCRINE: No polyuria or polydipsia. No heat or cold intolerance. HEMATOLOGY: No easy bruisability. No lymph node enlargement.   PAST MEDICAL HISTORY:  1. Diabetes mellitus type 2. 2. Systemic hypertension.   FAMILY HISTORY: His father died at  age of 37. He had unclear idea of how he died, possibly myocardial infarction, but he is unsure. His mother is alive and she suffers from hypertension.   SOCIAL HABITS: Nonsmoker. No history of alcohol or drug abuse.   SOCIAL HISTORY: The patient is married, living with his wife. He works as a Administrator for [employer].   ADMISSION MEDICATIONS:  1. Metformin 1000 mg twice a day. 2. Lantus 70 units at night.  3. Lisinopril 40 mg a day. 4. Omeprazole 40 mg a day.   ALLERGIES: No known drug allergies.   PHYSICAL EXAMINATION:   VITAL SIGNS: Blood pressure 147/79, respiratory rate 22, pulse 148 per minute, temperature 103.1, and oxygen saturation 93%.   GENERAL APPEARANCE: Young male lying in bed in no acute distress.   HEAD/NECK: No pallor. No icterus. No cyanosis.   EARS/NOSE/THROAT: Normal hearing. No discharge. No bleeding. Nasal mucosa, no discharge. No bleeding. No ulcers. Oral and pharyngeal area revealed no oral thrush, no ulcers, and no exudates.   EYES: Normal iris and conjunctivae. Pupils are about 3 to 4 mm, difficult to elicit reactivity to light.   NECK: Supple. Trachea at midline. No thyromegaly. No cervical lymphadenopathy. No masses.   CARDIAC: Normal S1 and S2. No S3 or S4. No murmur. No gallop. No carotid bruits.   RESPIRATORY: Normal breathing pattern without use of accessory muscles. No rales. No wheezing.   ABDOMEN: Soft without tenderness. No hepatosplenomegaly. No masses. No hernias.   SKIN: No ulcers. No subcutaneous nodules.   MUSCULOSKELETAL: No joint swelling. No clubbing.   NEUROLOGIC: Cranial nerves II through XII are intact. No focal motor deficit. Sensation is intact.   PSYCHIATRIC: The patient is alert  and oriented x3. Mood and affect were normal.   LABORATORY, DIAGNOSTIC AND RADIOLOGIC DATA: Chest x-ray showed no acute cardiopulmonary abnormality.   EKG showed sinus tachycardia at rate of 133, permanent, otherwise unremarkable  EKG. Nonspecific T wave abnormalities.   CBC showed white count of 8900, hemoglobin 14, hematocrit 42, and platelet count 242. Serum glucose 203, BUN 13, creatinine 0.9, sodium 137, potassium 3.4, and bicarbonate 126. Normal liver function tests. Troponin 0.02.   Urinalysis was negative.   Influenza A and B were negative.   ASSESSMENT:  1. Severe gastroenteritis. 2. Dehydration.  3. Tachycardia.  4. Hypokalemia.  5. Diabetes mellitus, type II.  6. Systemic hypertension. 7. Fever secondary to gastroenteritis.   PLAN: The patient was admitted for observation. Blood cultures were ordered and stool for culture as well. IV fluids for hydration along with potassium replacement. No antibiotic initiated       as presentation could be viral in origin. Accu-Cheks and sliding scale. Continue home medications as listed above. We will monitor his response and follow up on his cultures.   TIME SPENT EVALUATING THIS PATIENT: More than 50 minutes.  ____________________________ Clovis Pu. Lenore Manner, MD amd:slb D: 10/29/2012 05:17:08 ET T: 10/29/2012 09:01:11 ET JOB#: [id number]  cc: Clovis Pu. Lenore Manner, MD, <Dictator> Owens Loffler, MD Mike Craze Irven Coe MD ELECTRONICALLY SIGNED 10/29/2012 22:33

## 2015-04-15 ENCOUNTER — Other Ambulatory Visit (INDEPENDENT_AMBULATORY_CARE_PROVIDER_SITE_OTHER): Payer: 59

## 2015-04-15 DIAGNOSIS — Z125 Encounter for screening for malignant neoplasm of prostate: Secondary | ICD-10-CM | POA: Diagnosis not present

## 2015-04-15 DIAGNOSIS — E119 Type 2 diabetes mellitus without complications: Secondary | ICD-10-CM

## 2015-04-15 LAB — PSA: PSA: 0.99 ng/mL (ref 0.10–4.00)

## 2015-04-15 LAB — HEMOGLOBIN A1C: HEMOGLOBIN A1C: 8.1 % — AB (ref 4.6–6.5)

## 2015-04-22 ENCOUNTER — Ambulatory Visit (INDEPENDENT_AMBULATORY_CARE_PROVIDER_SITE_OTHER): Payer: 59 | Admitting: Family Medicine

## 2015-04-22 ENCOUNTER — Encounter: Payer: Self-pay | Admitting: Family Medicine

## 2015-04-22 VITALS — BP 140/76 | HR 103 | Temp 98.4°F | Ht 65.5 in | Wt 295.8 lb

## 2015-04-22 DIAGNOSIS — E7800 Pure hypercholesterolemia, unspecified: Secondary | ICD-10-CM

## 2015-04-22 DIAGNOSIS — I10 Essential (primary) hypertension: Secondary | ICD-10-CM | POA: Diagnosis not present

## 2015-04-22 DIAGNOSIS — K449 Diaphragmatic hernia without obstruction or gangrene: Secondary | ICD-10-CM | POA: Insufficient documentation

## 2015-04-22 DIAGNOSIS — E1159 Type 2 diabetes mellitus with other circulatory complications: Secondary | ICD-10-CM

## 2015-04-22 DIAGNOSIS — N521 Erectile dysfunction due to diseases classified elsewhere: Secondary | ICD-10-CM

## 2015-04-22 HISTORY — DX: Diaphragmatic hernia without obstruction or gangrene: K44.9

## 2015-04-22 MED ORDER — AMLODIPINE BESYLATE 5 MG PO TABS: ORAL_TABLET | ORAL | Status: DC

## 2015-04-22 MED ORDER — KETOCONAZOLE 200 MG PO TABS: ORAL_TABLET | ORAL | Status: DC

## 2015-04-22 MED ORDER — OMEPRAZOLE 40 MG PO CPDR: DELAYED_RELEASE_CAPSULE | ORAL | Status: DC

## 2015-04-22 MED ORDER — LANTUS SOLOSTAR 100 UNIT/ML ~~LOC~~ SOPN: 100.0000 [IU] | PEN_INJECTOR | Freq: Every day | SUBCUTANEOUS | Status: DC

## 2015-04-22 MED ORDER — PRAVASTATIN SODIUM 10 MG PO TABS: 10.0000 mg | ORAL_TABLET | Freq: Every day | ORAL | Status: DC

## 2015-04-22 MED ORDER — METFORMIN HCL 1000 MG PO TABS: ORAL_TABLET | ORAL | Status: DC

## 2015-04-22 NOTE — Patient Instructions (Addendum)
Splint Lantus dosing to 40 units in the morning and 40 units at night  GERD  Very Common Backflow of stomach acid and contents into esophagus Causes heartburn Can cause chest pain, difficulty swallowing  Prevention 1. Do not smoke 2. Lose weight if overweight 3. Elevate bed 4-6 inches 4. Some foods worsen: chocolate, mint, alcohol, OJ, caffeine, fat, fried or spicy food.  Hiatal Hernia A hiatal hernia occurs when part of your stomach slides above the muscle that separates your abdomen from your chest (diaphragm). You can be born with a hiatal hernia (congenital), or it may develop over time. In almost all cases of hiatal hernia, only the top part of the stomach pushes through.  Many people have a hiatal hernia with no symptoms. The larger the hernia, the more likely that you will have symptoms. In some cases, a hiatal hernia allows stomach acid to flow back into the tube that carries food from your mouth to your stomach (esophagus). This may cause heartburn symptoms. Severe heartburn symptoms may mean you have developed a condition called gastroesophageal reflux disease (GERD).  CAUSES  Hiatal hernias are caused by a weakness in the opening (hiatus) where your esophagus passes through your diaphragm to attach to the upper part of your stomach. You may be born with a weakness in your hiatus, or a weakness can develop. RISK FACTORS Older age is a major risk factor for a hiatal hernia. Anything that increases pressure on your diaphragm can also increase your risk of a hiatal hernia. This includes:  Pregnancy.  Excess weight.  Frequent constipation. SIGNS AND SYMPTOMS  People with a hiatal hernia often have no symptoms. If symptoms develop, they are almost always caused by GERD. They may include:  Heartburn.  Belching.  Indigestion.  Trouble swallowing.  Coughing or wheezing.  Sore throat.  Hoarseness.  Chest pain. DIAGNOSIS  A hiatal hernia is sometimes found during an  exam for another problem. Your health care provider may suspect a hiatal hernia if you have symptoms of GERD. Tests may be done to diagnose GERD. These may include:  X-rays of your stomach or chest.  An upper gastrointestinal (GI) series. This is an X-ray exam of your GI tract involving the use of a chalky liquid that you swallow. The liquid shows up clearly on the X-ray.  Endoscopy. This is a procedure to look into your stomach using a thin, flexible tube that has a tiny camera and light on the end of it. TREATMENT  If you have no symptoms, you may not need treatment. If you have symptoms, treatment may include:  Dietary and lifestyle changes to help reduce GERD symptoms.  Medicines. These may include:  Over-the-counter antacids.  Medicines that make your stomach empty more quickly.  Medicines that block the production of stomach acid (H2 blockers).  Stronger medicines to reduce stomach acid (proton pump inhibitors).  You may need surgery to repair the hernia if other treatments are not helping.   HOME CARE INSTRUCTIONS   Take all medicines as directed by your health care provider.  Quit smoking, if you smoke.  Try to achieve and maintain a healthy body weight.  Eat frequent small meals instead of three large meals a day. This keeps your stomach from getting too full.  Eat slowly.  Do not lie down right after eating.  Do noteat 1-2 hours before bed.   Do not drink beverages with caffeine. These include cola, coffee, cocoa, and tea.  Do not drink alcohol.  Avoid foods that can make symptoms of GERD worse. These may include:  Fatty foods.  Citrus fruits.  Other foods and drinks that contain acid.  Avoid putting pressure on your belly. Anything that puts pressure on your belly increases the amount of acid that may be pushed up into your esophagus.   Avoid bending over, especially after eating.  Raise the head of your bed by putting blocks under the legs.  This keeps your head and esophagus higher than your stomach.  Do not wear tight clothing around your chest or stomach.  Try not to strain when having a bowel movement, when urinating, or when lifting heavy objects. SEEK MEDICAL CARE IF:  Your symptoms are not controlled with medicines or lifestyle changes.  You are having trouble swallowing.  You have coughing or wheezing that will not go away. SEEK IMMEDIATE MEDICAL CARE IF:  Your pain is getting worse.  Your pain spreads to your arms, neck, jaw, teeth, or back.  You have shortness of breath.  You sweat for no reason.  You feel sick to your stomach (nauseous) or vomit.  You vomit blood.  You have bright red blood in your stools.  You have black, tarry stools.  Document Released: 02/18/2004 Document Revised: 04/14/2014 Document Reviewed: 11/15/2013 [facility] Patient Information 2015 [place], [place]. This information is not intended to replace advice given to you by your health care provider. Make sure you discuss any questions you have with your health care provider.

## 2015-04-22 NOTE — Progress Notes (Signed)
Pre visit review using our clinic review tool, if applicable. No additional management support is needed unless otherwise documented below in the visit note. 

## 2015-04-22 NOTE — Progress Notes (Signed)
Dr. Frederico Hamman T. Clarity Ciszek, MD, [facility] [place], [address] Phone: [phone] Fax: [phone]  04/22/2015  Patient: Eric Frederick, MRN: [id number], DOB: [birth year], 49 y.o.  Primary Physician:  Owens Loffler, MD  Chief Complaint: Diabetes  Subjective:   Eric Frederick is a 49 y.o. very pleasant male patient who presents with the following:  Immunization History  Administered Date(s) Administered  . Influenza Split 09/12/2012  . Influenza,inj,Quad PF,36+ Mos 10/22/2014  . Pneumococcal Conjugate-13 10/22/2014  . Pneumococcal Polysaccharide-23 03/13/2013  . Tdap 03/13/2013    Wt Readings from Last 3 Encounters:  04/22/15 295 lb 12 oz (134.151 kg)  10/22/14 286 lb 8 oz (129.956 kg)  03/12/14 284 lb 8 oz (129.048 kg)    Now doing 80 units every night.   Dx with hiatal hernia - years ago.  Had an endoscopy.  HTN: Tolerating all medications without side effects Stable and at goal No CP, no sob. No HA.  BP Readings from Last 3 Encounters:  04/22/15 140/76  10/22/14 144/70  03/12/14 882/80    Basic Metabolic Panel:    Component Value Date/Time   NA 137 10/15/2014 0935   NA 137 10/29/2012 0146   K 4.5 10/15/2014 0935   K 3.4* 10/29/2012 0146   CL 102 10/15/2014 0935   CL 102 10/29/2012 0146   CO2 28 10/15/2014 0935   CO2 26 10/29/2012 0146   BUN 14 10/15/2014 0935   BUN 13 10/29/2012 0146   CREATININE 0.8 10/15/2014 0935   CREATININE 0.91 10/29/2012 0146   GLUCOSE 124* 10/15/2014 0935   GLUCOSE 203* 10/29/2012 0146   CALCIUM 9.1 10/15/2014 0935   CALCIUM 7.9* 10/29/2012 0146   Lipids: Doing well, stable. Tolerating meds fine with no SE. Panel reviewed with patient.  Lipids:    Component Value Date/Time   CHOL 166 10/15/2014 0935   TRIG 59.0 10/15/2014 0935   HDL 40.10 10/15/2014 0935   LDLDIRECT 144.3 03/12/2014 1619   VLDL 11.8 10/15/2014 0935   CHOLHDL 4 10/15/2014 0935    Lab  Results  Component Value Date   ALT 22 03/12/2014   AST 15 03/12/2014   ALKPHOS 106 03/12/2014   BILITOT 0.8 03/12/2014     Past Medical History, Surgical History, Social History, Family History, Problem List, Medications, and Allergies have been reviewed and updated if relevant.   GEN: No acute illnesses, no fevers, chills. GI: No n/v/d, eating normally Pulm: No SOB Interactive and getting along well at home.  Otherwise, ROS is as per the HPI.  Objective:   BP 140/76 mmHg  Pulse 103  Temp(Src) 98.4 F (36.9 C) (Oral)  Ht 5' 5.5" (1.664 m)  Wt 295 lb 12 oz (134.151 kg)  BMI 48.45 kg/m2  GEN: WDWN, NAD, Non-toxic, A & O x 3 HEENT: Atraumatic, Normocephalic. Neck supple. No masses, No LAD. Ears and Nose: No external deformity. CV: RRR, No M/G/R. No JVD. No thrill. No extra heart sounds. PULM: CTA B, no wheezes, crackles, rhonchi. No retractions. No resp. distress. No accessory muscle use. EXTR: No c/c/e NEURO Normal gait.  PSYCH: Normally interactive. Conversant. Not depressed or anxious appearing.  Calm demeanor.   Laboratory and Imaging Data: Results for orders placed or performed in visit on 04/15/15  Hemoglobin A1c  Result Value Ref Range   Hgb A1c MFr Bld 8.1 (H) 4.6 - 6.5 %  PSA  Result Value Ref Range   PSA 0.99  0.10 - 4.00 ng/mL     Assessment and Plan:   Type 2 diabetes with circulatory disorder causing erectile dysfunction  Essential hypertension  HYPERCHOLESTEROLEMIA  Severe obesity (BMI >= 40)  Hiatal hernia  DM worsened -- eating very poorly Extra time talking about diet and weight loss  Answered all questions about hiatal hernia  Refilled all meds  Follow-up: 6 mo, cpx  New Prescriptions   No medications on file   No orders of the defined types were placed in this encounter.    Signed,  Maud Deed. Rachelann Enloe, MD  Patient Instructions  Splint Lantus dosing to 40 units in the morning and 40 units at night  GERD  Very  Common Backflow of stomach acid and contents into esophagus Causes heartburn Can cause chest pain, difficulty swallowing  Prevention 1. Do not smoke 2. Lose weight if overweight 3. Elevate bed 4-6 inches 4. Some foods worsen: chocolate, mint, alcohol, OJ, caffeine, fat, fried or spicy food.  Hiatal Hernia A hiatal hernia occurs when part of your stomach slides above the muscle that separates your abdomen from your chest (diaphragm). You can be born with a hiatal hernia (congenital), or it may develop over time. In almost all cases of hiatal hernia, only the top part of the stomach pushes through.  Many people have a hiatal hernia with no symptoms. The larger the hernia, the more likely that you will have symptoms. In some cases, a hiatal hernia allows stomach acid to flow back into the tube that carries food from your mouth to your stomach (esophagus). This may cause heartburn symptoms. Severe heartburn symptoms may mean you have developed a condition called gastroesophageal reflux disease (GERD).  CAUSES  Hiatal hernias are caused by a weakness in the opening (hiatus) where your esophagus passes through your diaphragm to attach to the upper part of your stomach. You may be born with a weakness in your hiatus, or a weakness can develop. RISK FACTORS Older age is a major risk factor for a hiatal hernia. Anything that increases pressure on your diaphragm can also increase your risk of a hiatal hernia. This includes:  Pregnancy.  Excess weight.  Frequent constipation. SIGNS AND SYMPTOMS  People with a hiatal hernia often have no symptoms. If symptoms develop, they are almost always caused by GERD. They may include:  Heartburn.  Belching.  Indigestion.  Trouble swallowing.  Coughing or wheezing.  Sore throat.  Hoarseness.  Chest pain. DIAGNOSIS  A hiatal hernia is sometimes found during an exam for another problem. Your health care provider may suspect a hiatal hernia if you  have symptoms of GERD. Tests may be done to diagnose GERD. These may include:  X-rays of your stomach or chest.  An upper gastrointestinal (GI) series. This is an X-ray exam of your GI tract involving the use of a chalky liquid that you swallow. The liquid shows up clearly on the X-ray.  Endoscopy. This is a procedure to look into your stomach using a thin, flexible tube that has a tiny camera and light on the end of it. TREATMENT  If you have no symptoms, you may not need treatment. If you have symptoms, treatment may include:  Dietary and lifestyle changes to help reduce GERD symptoms.  Medicines. These may include:  Over-the-counter antacids.  Medicines that make your stomach empty more quickly.  Medicines that block the production of stomach acid (H2 blockers).  Stronger medicines to reduce stomach acid (proton pump inhibitors).  You may need  surgery to repair the hernia if other treatments are not helping.   HOME CARE INSTRUCTIONS   Take all medicines as directed by your health care provider.  Quit smoking, if you smoke.  Try to achieve and maintain a healthy body weight.  Eat frequent small meals instead of three large meals a day. This keeps your stomach from getting too full.  Eat slowly.  Do not lie down right after eating.  Do noteat 1-2 hours before bed.   Do not drink beverages with caffeine. These include cola, coffee, cocoa, and tea.  Do not drink alcohol.  Avoid foods that can make symptoms of GERD worse. These may include:  Fatty foods.  Citrus fruits.  Other foods and drinks that contain acid.  Avoid putting pressure on your belly. Anything that puts pressure on your belly increases the amount of acid that may be pushed up into your esophagus.   Avoid bending over, especially after eating.  Raise the head of your bed by putting blocks under the legs. This keeps your head and esophagus higher than your stomach.  Do not wear tight clothing  around your chest or stomach.  Try not to strain when having a bowel movement, when urinating, or when lifting heavy objects. SEEK MEDICAL CARE IF:  Your symptoms are not controlled with medicines or lifestyle changes.  You are having trouble swallowing.  You have coughing or wheezing that will not go away. SEEK IMMEDIATE MEDICAL CARE IF:  Your pain is getting worse.  Your pain spreads to your arms, neck, jaw, teeth, or back.  You have shortness of breath.  You sweat for no reason.  You feel sick to your stomach (nauseous) or vomit.  You vomit blood.  You have bright red blood in your stools.  You have black, tarry stools.  Document Released: 02/18/2004 Document Revised: 04/14/2014 Document Reviewed: 11/15/2013 [facility] Patient Information 2015 [place], [place]. This information is not intended to replace advice given to you by your health care provider. Make sure you discuss any questions you have with your health care provider.       Patient's Medications  New Prescriptions   No medications on file  Previous Medications   INSULIN PEN NEEDLE 31G X 6 MM MISC    Inject insulin once daily as directed subcutaneous   LISINOPRIL (PRINIVIL,ZESTRIL) 40 MG TABLET    TAKE ONE TABLET BY MOUTH ONCE DAILY  Modified Medications   Modified Medication Previous Medication   AMLODIPINE (NORVASC) 5 MG TABLET amLODipine (NORVASC) 5 MG tablet      TAKE ONE TABLET BY MOUTH ONCE DAILY    TAKE ONE TABLET BY MOUTH ONCE DAILY   KETOCONAZOLE (NIZORAL) 200 MG TABLET ketoconazole (NIZORAL) 200 MG tablet      2 tabs po once then repeat 2 tabs in 1 week    2 tabs po once then repeat 2 tabs in 1 week   LANTUS SOLOSTAR 100 UNIT/ML SOLOSTAR PEN LANTUS SOLOSTAR 100 UNIT/ML Solostar Pen      Inject 100 Units into the skin at bedtime.    Inject 100 Units into the skin at bedtime.    METFORMIN (GLUCOPHAGE) 1000 MG TABLET metFORMIN (GLUCOPHAGE) 1000 MG tablet      TAKE ONE TABLET BY MOUTH TWICE DAILY WITH  A MEAL    TAKE ONE TABLET BY MOUTH TWICE DAILY WITH A MEAL   OMEPRAZOLE (PRILOSEC) 40 MG CAPSULE omeprazole (PRILOSEC) 40 MG capsule      TAKE ONE CAPSULE BY  MOUTH EVERY DAY    TAKE ONE CAPSULE BY MOUTH EVERY DAY   PRAVASTATIN (PRAVACHOL) 10 MG TABLET pravastatin (PRAVACHOL) 10 MG tablet      Take 1 tablet (10 mg total) by mouth daily.    Take 1 tablet (10 mg total) by mouth daily.  Discontinued Medications   INSULIN GLARGINE (LANTUS) 100 UNIT/ML INJECTION    INJECT 100 UNITS SUBCUTANEOUSLY AT BEDTIME   KETOCONAZOLE (NIZORAL) 200 MG TABLET    2 tabs po once then repeat 2 tabs in 1 week

## 2015-05-01 ENCOUNTER — Other Ambulatory Visit: Payer: Self-pay | Admitting: *Deleted

## 2015-05-01 MED ORDER — LANTUS SOLOSTAR 100 UNIT/ML ~~LOC~~ SOPN: 100.0000 [IU] | PEN_INJECTOR | Freq: Every day | SUBCUTANEOUS | Status: DC

## 2015-10-21 ENCOUNTER — Other Ambulatory Visit: Payer: 59

## 2015-10-23 ENCOUNTER — Other Ambulatory Visit (INDEPENDENT_AMBULATORY_CARE_PROVIDER_SITE_OTHER): Payer: Commercial Managed Care - HMO

## 2015-10-23 DIAGNOSIS — E785 Hyperlipidemia, unspecified: Secondary | ICD-10-CM | POA: Diagnosis not present

## 2015-10-23 DIAGNOSIS — E119 Type 2 diabetes mellitus without complications: Secondary | ICD-10-CM | POA: Diagnosis not present

## 2015-10-23 DIAGNOSIS — I10 Essential (primary) hypertension: Secondary | ICD-10-CM

## 2015-10-23 DIAGNOSIS — Z79899 Other long term (current) drug therapy: Secondary | ICD-10-CM | POA: Diagnosis not present

## 2015-10-23 DIAGNOSIS — E7800 Pure hypercholesterolemia, unspecified: Secondary | ICD-10-CM

## 2015-10-23 LAB — BASIC METABOLIC PANEL
BUN: 12 mg/dL (ref 6–23)
CHLORIDE: 105 meq/L (ref 96–112)
CO2: 29 meq/L (ref 19–32)
CREATININE: 0.68 mg/dL (ref 0.40–1.50)
Calcium: 9.5 mg/dL (ref 8.4–10.5)
GFR: 159.04 mL/min (ref >60.00)
GLUCOSE: 154 mg/dL — AB (ref 70–99)
Potassium: 4.3 mEq/L (ref 3.5–5.1)
Sodium: 141 mEq/L (ref 135–145)

## 2015-10-23 LAB — CBC WITH DIFFERENTIAL/PLATELET
BASOS ABS: 0 10*3/uL (ref 0.0–0.1)
Basophils Relative: 0.3 % (ref 0.0–3.0)
EOS PCT: 6.1 % — AB (ref 0.0–5.0)
Eosinophils Absolute: 0.5 10*3/uL (ref 0.0–0.7)
HEMATOCRIT: 46.5 % (ref 39.0–52.0)
HEMOGLOBIN: 14.7 g/dL (ref 13.0–17.0)
LYMPHS ABS: 2.3 10*3/uL (ref 0.7–4.0)
LYMPHS PCT: 30.4 % (ref 12.0–46.0)
MCHC: 31.6 g/dL (ref 30.0–36.0)
MCV: 87.7 fl (ref 78.0–100.0)
MONOS PCT: 9 % (ref 3.0–12.0)
Monocytes Absolute: 0.7 10*3/uL (ref 0.1–1.0)
NEUTROS PCT: 54.2 % (ref 43.0–77.0)
Neutro Abs: 4.1 10*3/uL (ref 1.4–7.7)
Platelets: 315 10*3/uL (ref 150.0–400.0)
RBC: 5.3 Mil/uL (ref 4.22–5.81)
RDW: 13.5 % (ref 11.5–15.5)
WBC: 7.6 10*3/uL (ref 4.0–10.5)

## 2015-10-23 LAB — HEPATIC FUNCTION PANEL
ALBUMIN: 4.2 g/dL (ref 3.5–5.2)
ALT: 24 U/L (ref 0–53)
AST: 16 U/L (ref 0–37)
Alkaline Phosphatase: 97 U/L (ref 39–117)
Bilirubin, Direct: 0.1 mg/dL (ref 0.0–0.3)
TOTAL PROTEIN: 6.9 g/dL (ref 6.0–8.3)
Total Bilirubin: 0.4 mg/dL (ref 0.2–1.2)

## 2015-10-23 LAB — LIPID PANEL
Cholesterol: 160 mg/dL (ref 0–200)
HDL: 38 mg/dL — AB (ref >39.00)
LDL Cholesterol: 115 mg/dL — ABNORMAL HIGH (ref 0–99)
NONHDL: 122.15
Total CHOL/HDL Ratio: 4
Triglycerides: 37 mg/dL (ref 0.0–149.0)
VLDL: 7.4 mg/dL (ref 0.0–40.0)

## 2015-10-23 LAB — MICROALBUMIN / CREATININE URINE RATIO
CREATININE, U: 156 mg/dL
MICROALB UR: 2.2 mg/dL — AB (ref 0.0–1.9)
MICROALB/CREAT RATIO: 1.4 mg/g (ref 0.0–30.0)

## 2015-10-23 LAB — HEMOGLOBIN A1C: HEMOGLOBIN A1C: 7.1 % — AB (ref 4.6–6.5)

## 2015-10-28 ENCOUNTER — Ambulatory Visit (INDEPENDENT_AMBULATORY_CARE_PROVIDER_SITE_OTHER): Payer: Commercial Managed Care - HMO | Admitting: Family Medicine

## 2015-10-28 ENCOUNTER — Encounter: Payer: Self-pay | Admitting: *Deleted

## 2015-10-28 ENCOUNTER — Encounter: Payer: Self-pay | Admitting: Family Medicine

## 2015-10-28 VITALS — BP 150/82 | HR 106 | Temp 98.9°F | Ht 65.5 in | Wt 291.8 lb

## 2015-10-28 DIAGNOSIS — Z125 Encounter for screening for malignant neoplasm of prostate: Secondary | ICD-10-CM

## 2015-10-28 DIAGNOSIS — Z0000 Encounter for general adult medical examination without abnormal findings: Secondary | ICD-10-CM | POA: Diagnosis not present

## 2015-10-28 DIAGNOSIS — Z23 Encounter for immunization: Secondary | ICD-10-CM | POA: Diagnosis not present

## 2015-10-28 DIAGNOSIS — E1159 Type 2 diabetes mellitus with other circulatory complications: Secondary | ICD-10-CM

## 2015-10-28 DIAGNOSIS — N521 Erectile dysfunction due to diseases classified elsewhere: Secondary | ICD-10-CM

## 2015-10-28 LAB — PSA: PSA: 0.93 ng/mL (ref 0.10–4.00)

## 2015-10-28 MED ORDER — LISINOPRIL 40 MG PO TABS: ORAL_TABLET | ORAL | Status: DC

## 2015-10-28 MED ORDER — LANTUS SOLOSTAR 100 UNIT/ML ~~LOC~~ SOPN: 40.0000 [IU] | PEN_INJECTOR | Freq: Two times a day (BID) | SUBCUTANEOUS | Status: DC

## 2015-10-28 NOTE — Progress Notes (Signed)
Pre visit review using our clinic review tool, if applicable. No additional management support is needed unless otherwise documented below in the visit note. 

## 2015-10-28 NOTE — Addendum Note (Signed)
Addended by: Daralene Milch C on: 10/28/2015 11:01 AM   Modules accepted: SmartSet

## 2015-10-28 NOTE — Progress Notes (Signed)
Dr. Frederico Hamman T. Rocsi Hazelbaker, MD, [facility] [place], [address] Phone: [phone] Fax: [phone]  10/28/2015  Patient: Eric Frederick, MRN: [id number], DOB: [birth year], 49 y.o.  Primary Physician:  Owens Loffler, MD   Chief Complaint  Patient presents with  . Annual Exam   Subjective:   NORIS KULINSKI is a 49 y.o. pleasant patient who presents with the following:  Preventative Health Maintenance Visit:  Health Maintenance Summary Reviewed and updated, unless pt declines services.  Tobacco History Reviewed. Alcohol: No concerns, no excessive use Exercise Habits: Some activity, rec at least 30 mins 5 times a week STD concerns: no risk or activity to increase risk Drug Use: None Encouraged self-testicular check  Now at lantus BID, 40 and 40.  Health Maintenance  Topic Date Due  . HIV Screening  04/10/1981  . FOOT EXAM  03/16/2015  . OPHTHALMOLOGY EXAM  03/16/2015  . HEMOGLOBIN A1C  04/21/2016  . INFLUENZA VACCINE  07/12/2016  . PNEUMOCOCCAL POLYSACCHARIDE VACCINE (2) 03/13/2018  . TETANUS/TDAP  03/14/2023   Immunization History  Administered Date(s) Administered  . Influenza Split 09/12/2012  . Influenza,inj,Quad PF,36+ Mos 10/22/2014, 10/28/2015  . Pneumococcal Conjugate-13 10/22/2014  . Pneumococcal Polysaccharide-23 03/13/2013  . Tdap 03/13/2013   Patient Active Problem List   Diagnosis Date Noted  . Hiatal hernia 04/22/2015  . Severe obesity (BMI >= 40) ([place]) 03/12/2014  . History of noncompliance with medical treatment 03/14/2012  . Erectile dysfunction 03/02/2011  . Type 2 diabetes with circulatory disorder causing erectile dysfunction ([place]) 07/21/2010  . HYPERCHOLESTEROLEMIA 07/21/2010  . Essential hypertension 07/21/2010   Past Medical History  Diagnosis Date  . Diabetes mellitus   . Hyperlipidemia   . Hypertension   . Hiatal hernia 04/22/2015   Past Surgical History  Procedure  Laterality Date  . Shoulder arthroscopy w/ rotator cuff repair Right 2014    [place]   Social History   Social History  . Marital Status: Married    Spouse Name: N/A  . Number of Children: N/A  . Years of Education: N/A   Occupational History  . Not on file.   Social History Main Topics  . Smoking status: Former Smoker    Types: Cigarettes    Quit date: 03/02/1991  . Smokeless tobacco: Never Used  . Alcohol Use: No  . Drug Use: No  . Sexual Activity: Not on file   Other Topics Concern  . Not on file   Social History Narrative   No family history on file. No Known Allergies  Medication list has been reviewed and updated.   General: Denies fever, chills, sweats. No significant weight loss. Eyes: Denies blurring,significant itching ENT: Denies earache, sore throat, and hoarseness. Cardiovascular: Denies chest pains, palpitations, dyspnea on exertion Respiratory: Denies cough, dyspnea at rest,wheeezing Breast: no concerns about lumps GI: Denies nausea, vomiting, diarrhea, constipation, change in bowel habits, abdominal pain, melena, hematochezia GU: Denies penile discharge, ED, urinary flow / outflow problems. No STD concerns. Musculoskeletal: Denies back pain, joint pain Derm: Denies rash, itching Neuro: Denies  paresthesias, frequent falls, frequent headaches Psych: Denies depression, anxiety Endocrine: Denies cold intolerance, heat intolerance, polydipsia Heme: Denies enlarged lymph nodes Allergy: No hayfever  Objective:   BP 150/82 mmHg  Pulse 106  Temp(Src) 98.9 F (37.2 C) (Oral)  Ht 5' 5.5" (1.664 m)  Wt 291 lb 12 oz (132.337 kg)  BMI 47.79 kg/m2 Ideal Body Weight: Weight in (lb) to have  BMI = 25: 152.2  No exam data present  GEN: well developed, well nourished, no acute distress Eyes: conjunctiva and lids normal, PERRLA, EOMI ENT: TM clear, nares clear, oral exam WNL Neck: supple, no lymphadenopathy, no thyromegaly, no JVD Pulm: clear to  auscultation and percussion, respiratory effort normal CV: regular rate and rhythm, S1-S2, no murmur, rub or gallop, no bruits, peripheral pulses normal and symmetric, no cyanosis, clubbing, edema or varicosities GI: soft, non-tender; no hepatosplenomegaly, masses; active bowel sounds all quadrants GU: no hernia, testicular mass, penile discharge Lymph: no cervical, axillary or inguinal adenopathy MSK: gait normal, muscle tone and strength WNL, no joint swelling, effusions, discoloration, crepitus  SKIN: clear, good turgor, color WNL, no rashes, lesions, or ulcerations Neuro: normal mental status, normal strength, sensation, and motion Psych: alert; oriented to person, place and time, normally interactive and not anxious or depressed in appearance. All labs reviewed with patient.  Lipids:    Component Value Date/Time   CHOL 160 10/23/2015 1118   TRIG 37.0 10/23/2015 1118   HDL 38.00* 10/23/2015 1118   LDLDIRECT 144.3 03/12/2014 1619   VLDL 7.4 10/23/2015 1118   CHOLHDL 4 10/23/2015 1118   CBC: CBC Latest Ref Rng 10/23/2015 03/12/2014 03/13/2013  WBC 4.0 - 10.5 K/uL 7.6 9.4 7.5  Hemoglobin 13.0 - 17.0 g/dL 14.7 15.2 14.5  Hematocrit 39.0 - 52.0 % 46.5 46.3 44.7  Platelets 150.0 - 400.0 K/uL 315.0 325.0 998.3    Basic Metabolic Panel:    Component Value Date/Time   NA 141 10/23/2015 1118   NA 137 10/29/2012 0146   K 4.3 10/23/2015 1118   K 3.4* 10/29/2012 0146   CL 105 10/23/2015 1118   CL 102 10/29/2012 0146   CO2 29 10/23/2015 1118   CO2 26 10/29/2012 0146   BUN 12 10/23/2015 1118   BUN 13 10/29/2012 0146   CREATININE 0.68 10/23/2015 1118   CREATININE 0.91 10/29/2012 0146   GLUCOSE 154* 10/23/2015 1118   GLUCOSE 203* 10/29/2012 0146   CALCIUM 9.5 10/23/2015 1118   CALCIUM 7.9* 10/29/2012 0146   Hepatic Function Latest Ref Rng 10/23/2015 03/12/2014 10/29/2012  Total Protein 6.0 - 8.3 g/dL 6.9 7.4 6.8  Albumin 3.5 - 5.2 g/dL 4.2 4.5 3.5  AST 0 - 37 U/L 16 15 35  ALT 0 -  53 U/L 24 22 45  Alk Phosphatase 39 - 117 U/L 97 106 110  Total Bilirubin 0.2 - 1.2 mg/dL 0.4 0.8 0.9  Bilirubin, Direct 0.0 - 0.3 mg/dL 0.1 0.1 -    Lab Results  Component Value Date   TSH 0.74 07/28/2010   Lab Results  Component Value Date   PSA 0.99 04/15/2015   PSA 1.00 03/12/2014   PSA 0.94 02/23/2011    Assessment and Plan:   Healthcare maintenance  Need for prophylactic vaccination and inoculation against influenza - Plan: Flu Vaccine QUAD 36+ mos IM  Type 2 diabetes with circulatory disorder causing erectile dysfunction (HCC)  Screening PSA (prostate specific antigen) - Plan: PSA  Health Maintenance Exam: The patient's preventative maintenance and recommended screening tests for an annual wellness exam were reviewed in full today. Brought up to date unless services declined.  Counselled on the importance of diet, exercise, and its role in overall health and mortality. The patient's FH and SH was reviewed, including their home life, tobacco status, and drug and alcohol status.  Goal 10 pound weight loss  Follow-up: Return in about 6 months (around 04/26/2016). Unless noted, follow-up in 1  year for Health Maintenance Exam.  New Prescriptions   No medications on file   Modified Medications   Modified Medication Previous Medication   LANTUS SOLOSTAR 100 UNIT/ML SOLOSTAR PEN LANTUS SOLOSTAR 100 UNIT/ML Solostar Pen      Inject 40 Units into the skin 2 (two) times daily.    Inject 100 Units into the skin at bedtime.   LISINOPRIL (PRINIVIL,ZESTRIL) 40 MG TABLET lisinopril (PRINIVIL,ZESTRIL) 40 MG tablet      TAKE ONE TABLET BY MOUTH ONCE DAILY    TAKE ONE TABLET BY MOUTH ONCE DAILY   Orders Placed This Encounter  Procedures  . Flu Vaccine QUAD 36+ mos IM  . PSA    Signed,  Frederico Hamman T. Sly Parlee, MD   Patient's Medications  New Prescriptions   No medications on file  Previous Medications   AMLODIPINE (NORVASC) 5 MG TABLET    TAKE ONE TABLET BY MOUTH ONCE  DAILY   INSULIN PEN NEEDLE 31G X 6 MM MISC    Inject insulin once daily as directed subcutaneous   KETOCONAZOLE (NIZORAL) 200 MG TABLET    2 tabs po once then repeat 2 tabs in 1 week   METFORMIN (GLUCOPHAGE) 1000 MG TABLET    TAKE ONE TABLET BY MOUTH TWICE DAILY WITH A MEAL   OMEPRAZOLE (PRILOSEC) 40 MG CAPSULE    TAKE ONE CAPSULE BY MOUTH EVERY DAY   PRAVASTATIN (PRAVACHOL) 10 MG TABLET    Take 1 tablet (10 mg total) by mouth daily.  Modified Medications   Modified Medication Previous Medication   LANTUS SOLOSTAR 100 UNIT/ML SOLOSTAR PEN LANTUS SOLOSTAR 100 UNIT/ML Solostar Pen      Inject 40 Units into the skin 2 (two) times daily.    Inject 100 Units into the skin at bedtime.   LISINOPRIL (PRINIVIL,ZESTRIL) 40 MG TABLET lisinopril (PRINIVIL,ZESTRIL) 40 MG tablet      TAKE ONE TABLET BY MOUTH ONCE DAILY    TAKE ONE TABLET BY MOUTH ONCE DAILY  Discontinued Medications   No medications on file

## 2016-02-05 ENCOUNTER — Telehealth: Payer: Self-pay | Admitting: *Deleted

## 2016-02-05 MED ORDER — LANTUS SOLOSTAR 100 UNIT/ML ~~LOC~~ SOPN: 40.0000 [IU] | PEN_INJECTOR | Freq: Two times a day (BID) | SUBCUTANEOUS | Status: DC

## 2016-02-05 NOTE — Telephone Encounter (Signed)
Pt called and wants to know why his lantus is more expensive than last time. Spoke with Crystal at [address] and was told pt can have 4 boxes of lantus solostar for 75 day supply for $ 150.00; cannot give over 90 day supply and 5 boxes would be 93 day supply. Advised pt and he voiced understanding. Pt said he will pick up the 4 boxes at [facility]/ pt did get a letter from the ins co as of 03/12/16 ins will no longer cover lantus; replacement options are Basaglar which is tier I and levemir which is tier II. Pt request cb after Dr Lorelei Pont decides which med he will be switched to.

## 2016-02-08 MED ORDER — INSULIN GLARGINE 100 UNIT/ML SOLOSTAR PEN: 40.0000 [IU] | PEN_INJECTOR | Freq: Two times a day (BID) | SUBCUTANEOUS | Status: DC

## 2016-02-08 NOTE — Addendum Note (Signed)
Addended by: Owens Loffler on: 02/08/2016 01:52 PM   Modules accepted: Orders, Medications

## 2016-02-08 NOTE — Telephone Encounter (Signed)
We should convert to Basaglar insulin which has a 1-1 conversion to lantus.   Basaglar 40 units BID  3 month supply, 1 refill  I was unsure the # of pens, so please confirm this with the pharmacy.   Let the patient know - this will save him a lot of money, and it is basically the same as lantus.

## 2016-02-08 NOTE — Telephone Encounter (Signed)
Left message for Eric Frederick to return my call.

## 2016-02-09 ENCOUNTER — Encounter: Payer: Self-pay | Admitting: Gastroenterology

## 2016-02-09 NOTE — Telephone Encounter (Signed)
Patient returned Donna's call.  Please call back at [phone].

## 2016-02-09 NOTE — Telephone Encounter (Signed)
Left message for Eric Frederick to return my call.

## 2016-02-09 NOTE — Telephone Encounter (Signed)
Eric Frederick notified as instructed by telephone.  He will use up the Lantus Solostar pens he just picked up first before getting and starting the [place].  I advised if there is any problems with how Dr. Lorelei Pont wrote the new prescription or he has any problems when picking up the new insulin, to give [place] a call.

## 2016-04-27 ENCOUNTER — Encounter: Payer: Self-pay | Admitting: Family Medicine

## 2016-04-27 ENCOUNTER — Ambulatory Visit (INDEPENDENT_AMBULATORY_CARE_PROVIDER_SITE_OTHER): Payer: Commercial Managed Care - HMO | Admitting: Family Medicine

## 2016-04-27 ENCOUNTER — Encounter: Payer: Self-pay | Admitting: Gastroenterology

## 2016-04-27 ENCOUNTER — Encounter: Payer: Self-pay | Admitting: *Deleted

## 2016-04-27 VITALS — BP 130/70 | HR 96 | Temp 98.5°F | Ht 65.5 in | Wt 281.5 lb

## 2016-04-27 DIAGNOSIS — Z1211 Encounter for screening for malignant neoplasm of colon: Secondary | ICD-10-CM | POA: Diagnosis not present

## 2016-04-27 DIAGNOSIS — I10 Essential (primary) hypertension: Secondary | ICD-10-CM

## 2016-04-27 DIAGNOSIS — N521 Erectile dysfunction due to diseases classified elsewhere: Secondary | ICD-10-CM | POA: Diagnosis not present

## 2016-04-27 DIAGNOSIS — E1159 Type 2 diabetes mellitus with other circulatory complications: Secondary | ICD-10-CM

## 2016-04-27 DIAGNOSIS — E7800 Pure hypercholesterolemia, unspecified: Secondary | ICD-10-CM

## 2016-04-27 LAB — HEMOGLOBIN A1C: HEMOGLOBIN A1C: 5.8 % (ref 4.6–6.5)

## 2016-04-27 LAB — GLUCOSE, POCT (MANUAL RESULT ENTRY): POC GLUCOSE: 131 mg/dL — AB (ref 70–99)

## 2016-04-27 MED ORDER — BASAGLAR KWIKPEN 100 UNIT/ML ~~LOC~~ SOPN: 40.0000 [IU] | PEN_INJECTOR | Freq: Two times a day (BID) | SUBCUTANEOUS | Status: DC

## 2016-04-27 MED ORDER — INSULIN GLARGINE 100 UNIT/ML SOLOSTAR PEN: 40.0000 [IU] | PEN_INJECTOR | Freq: Two times a day (BID) | SUBCUTANEOUS | Status: DC

## 2016-04-27 NOTE — Progress Notes (Signed)
Dr. Frederico Hamman T. Jasmina Gendron, MD, [facility] [place], [address] Phone: [phone] Fax: [phone]  04/27/2016  Patient: Eric Frederick, MRN: [id number], DOB: 01-16-1966, 50 y.o.  Primary Physician:  Owens Loffler, MD   Chief Complaint  Patient presents with  . Follow-up   Subjective:   Eric Frederick is a 50 y.o. very pleasant male patient who presents with the following:  Diabetes Mellitus: Tolerating Medications: yes Compliance with diet: good Exercise: minimal / intermittent Avg blood sugars at home: 90-150 Foot problems: none Hypoglycemia: none No nausea, vomitting, blurred vision, polyuria.  Lab Results  Component Value Date   HGBA1C 5.8 04/27/2016   HGBA1C 7.1* 10/23/2015   HGBA1C 8.1* 04/15/2015   Lab Results  Component Value Date   MICROALBUR 2.2* 10/23/2015   LDLCALC 115* 10/23/2015   CREATININE 0.68 10/23/2015    Wt Readings from Last 3 Encounters:  04/27/16 281 lb 8 oz (127.688 kg)  10/28/15 291 lb 12 oz (132.337 kg)  04/22/15 295 lb 12 oz (134.151 kg)    Body mass index is 46.12 kg/(m^2).   HTN: Tolerating all medications without side effects Stable and at goal No CP, no sob. No HA.  BP Readings from Last 3 Encounters:  04/27/16 130/70  10/28/15 150/82  04/22/15 [id number]    Basic Metabolic Panel:    Component Value Date/Time   NA 141 10/23/2015 1118   NA 137 10/29/2012 0146   K 4.3 10/23/2015 1118   K 3.4* 10/29/2012 0146   CL 105 10/23/2015 1118   CL 102 10/29/2012 0146   CO2 29 10/23/2015 1118   CO2 26 10/29/2012 0146   BUN 12 10/23/2015 1118   BUN 13 10/29/2012 0146   CREATININE 0.68 10/23/2015 1118   CREATININE 0.91 10/29/2012 0146   GLUCOSE 154* 10/23/2015 1118   GLUCOSE 203* 10/29/2012 0146   CALCIUM 9.5 10/23/2015 1118   CALCIUM 7.9* 10/29/2012 0146     Lipids: Doing well, stable. Panel reviewed with patient.  Lipids:    Component Value Date/Time   CHOL  160 10/23/2015 1118   TRIG 37.0 10/23/2015 1118   HDL 38.00* 10/23/2015 1118   LDLDIRECT 144.3 03/12/2014 1619   VLDL 7.4 10/23/2015 1118   CHOLHDL 4 10/23/2015 1118    Lab Results  Component Value Date   ALT 24 10/23/2015   AST 16 10/23/2015   ALKPHOS 97 10/23/2015   BILITOT 0.4 10/23/2015     Past Medical History, Surgical History, Social History, Family History, Problem List, Medications, and Allergies have been reviewed and updated if relevant.  Patient Active Problem List   Diagnosis Date Noted  . Hiatal hernia 04/22/2015  . Severe obesity (BMI >= 40) ([place]) 03/12/2014  . History of noncompliance with medical treatment 03/14/2012  . Erectile dysfunction 03/02/2011  . Type 2 diabetes with circulatory disorder causing erectile dysfunction ([place]) 07/21/2010  . HYPERCHOLESTEROLEMIA 07/21/2010  . Essential hypertension 07/21/2010    Past Medical History  Diagnosis Date  . Diabetes mellitus   . Hyperlipidemia   . Hypertension   . Hiatal hernia 04/22/2015    Past Surgical History  Procedure Laterality Date  . Shoulder arthroscopy w/ rotator cuff repair Right 2014    [place]    Social History   Social History  . Marital Status: Married    Spouse Name: N/A  . Number of Children: N/A  . Years of Education: N/A   Occupational History  . Not  on file.   Social History Main Topics  . Smoking status: Former Smoker    Types: Cigarettes    Quit date: 03/02/1991  . Smokeless tobacco: Never Used  . Alcohol Use: No  . Drug Use: No  . Sexual Activity: Not on file   Other Topics Concern  . Not on file   Social History Narrative    No family history on file.  No Known Allergies  Medication list reviewed and updated in full in [facility].   GEN: No acute illnesses, no fevers, chills. GI: No n/v/d, eating normally Pulm: No SOB Interactive and getting along well at home.  Otherwise, ROS is as per the HPI.  Objective:   BP 130/70 mmHg  Pulse 96   Temp(Src) 98.5 F (36.9 C) (Oral)  Ht 5' 5.5" (1.664 m)  Wt 281 lb 8 oz (127.688 kg)  BMI 46.12 kg/m2  GEN: WDWN, NAD, Non-toxic, A & O x 3 HEENT: Atraumatic, Normocephalic. Neck supple. No masses, No LAD. Ears and Nose: No external deformity. CV: RRR, No M/G/R. No JVD. No thrill. No extra heart sounds. PULM: CTA B, no wheezes, crackles, rhonchi. No retractions. No resp. distress. No accessory muscle use. EXTR: No c/c/e NEURO Normal gait.  PSYCH: Normally interactive. Conversant. Not depressed or anxious appearing.  Calm demeanor.   Laboratory and Imaging Data:  Assessment and Plan:   Type 2 diabetes with circulatory disorder causing erectile dysfunction (HCC) - Plan: Hemoglobin A1c, POCT glucose (manual entry)  Essential hypertension  HYPERCHOLESTEROLEMIA  Special screening for malignant neoplasms, colon - Plan: Ambulatory referral to Gastroenterology  Morbid obesity due to excess calories [facility])  He is doing very well.  His A1c is the best it is been in recent years.  His blood pressure is stable.  His cholesterol is in good check.  He also has continued to lose weight and is enthusiastic.  Goal of 10 pounds additional weight loss by next examination.  Follow-up: 6 mos CPX  New Prescriptions   INSULIN GLARGINE (BASAGLAR KWIKPEN) 100 UNIT/ML SOPN    Inject 0.4 mLs (40 Units total) into the skin 2 (two) times daily.   Modified Medications   No medications on file   Orders Placed This Encounter  Procedures  . Hemoglobin A1c  . Ambulatory referral to Gastroenterology  . POCT glucose (manual entry)    Signed,  Raekwon Winkowski T. Koki Buxton, MD   Patient's Medications  New Prescriptions   INSULIN GLARGINE (BASAGLAR KWIKPEN) 100 UNIT/ML SOPN    Inject 0.4 mLs (40 Units total) into the skin 2 (two) times daily.  Previous Medications   AMLODIPINE (NORVASC) 5 MG TABLET    TAKE ONE TABLET BY MOUTH ONCE DAILY   INSULIN PEN NEEDLE 31G X 6 MM MISC    Inject insulin once daily as  directed subcutaneous   KETOCONAZOLE (NIZORAL) 200 MG TABLET    2 tabs po once then repeat 2 tabs in 1 week   LISINOPRIL (PRINIVIL,ZESTRIL) 40 MG TABLET    TAKE ONE TABLET BY MOUTH ONCE DAILY   METFORMIN (GLUCOPHAGE) 1000 MG TABLET    TAKE ONE TABLET BY MOUTH TWICE DAILY WITH A MEAL   OMEPRAZOLE (PRILOSEC) 40 MG CAPSULE    TAKE ONE CAPSULE BY MOUTH EVERY DAY   PRAVASTATIN (PRAVACHOL) 10 MG TABLET    Take 1 tablet (10 mg total) by mouth daily.  Modified Medications   No medications on file  Discontinued Medications   INSULIN GLARGINE (BASAGLAR KWIKPEN) 100 UNIT/ML SOLOSTAR  PEN    Inject 40 Units into the skin 2 (two) times daily. 3 month supply

## 2016-04-27 NOTE — Progress Notes (Signed)
Pre visit review using our clinic review tool, if applicable. No additional management support is needed unless otherwise documented below in the visit note. 

## 2016-04-27 NOTE — Patient Instructions (Signed)

## 2016-06-08 ENCOUNTER — Ambulatory Visit (AMBULATORY_SURGERY_CENTER): Payer: Self-pay

## 2016-06-08 VITALS — Ht 66.5 in | Wt 281.0 lb

## 2016-06-08 DIAGNOSIS — Z1211 Encounter for screening for malignant neoplasm of colon: Secondary | ICD-10-CM

## 2016-06-08 MED ORDER — SUPREP BOWEL PREP KIT 17.5-3.13-1.6 GM/177ML PO SOLN: 1.0000 | Freq: Once | ORAL | Status: DC

## 2016-06-08 NOTE — Progress Notes (Signed)
No allergies to eggs or soy No past problems with anesthesia No home oxygen No diet meds  Has email and internet; registered emmi

## 2016-06-11 DIAGNOSIS — D126 Benign neoplasm of colon, unspecified: Secondary | ICD-10-CM

## 2016-06-11 HISTORY — DX: Benign neoplasm of colon, unspecified: D12.6

## 2016-06-20 ENCOUNTER — Other Ambulatory Visit: Payer: Self-pay | Admitting: Family Medicine

## 2016-06-20 ENCOUNTER — Encounter: Payer: Self-pay | Admitting: Gastroenterology

## 2016-06-29 ENCOUNTER — Ambulatory Visit (AMBULATORY_SURGERY_CENTER): Payer: Commercial Managed Care - HMO | Admitting: Gastroenterology

## 2016-06-29 ENCOUNTER — Encounter: Payer: Self-pay | Admitting: Gastroenterology

## 2016-06-29 VITALS — BP 124/69 | HR 83 | Temp 98.7°F | Resp 16 | Ht 66.5 in | Wt 281.0 lb

## 2016-06-29 DIAGNOSIS — Z1211 Encounter for screening for malignant neoplasm of colon: Secondary | ICD-10-CM

## 2016-06-29 DIAGNOSIS — K635 Polyp of colon: Secondary | ICD-10-CM | POA: Diagnosis not present

## 2016-06-29 DIAGNOSIS — D120 Benign neoplasm of cecum: Secondary | ICD-10-CM

## 2016-06-29 DIAGNOSIS — D125 Benign neoplasm of sigmoid colon: Secondary | ICD-10-CM

## 2016-06-29 DIAGNOSIS — D124 Benign neoplasm of descending colon: Secondary | ICD-10-CM | POA: Diagnosis not present

## 2016-06-29 MED ORDER — SODIUM CHLORIDE 0.9 % IV SOLN: 500.0000 mL | INTRAVENOUS | Status: DC

## 2016-06-29 NOTE — Progress Notes (Signed)
Called to room to assist during endoscopic procedure.  Patient ID and intended procedure confirmed with present staff. Received instructions for my participation in the procedure from the performing physician.  

## 2016-06-29 NOTE — Patient Instructions (Signed)
YOU HAD AN ENDOSCOPIC PROCEDURE TODAY AT THE  ENDOSCOPY CENTER:   Refer to the procedure report that was given to you for any specific questions about what was found during the examination.  If the procedure report does not answer your questions, please call your gastroenterologist to clarify.  If you requested that your care partner not be given the details of your procedure findings, then the procedure report has been included in a sealed envelope for you to review at your convenience later.  YOU SHOULD EXPECT: Some feelings of bloating in the abdomen. Passage of more gas than usual.  Walking can help get rid of the air that was put into your GI tract during the procedure and reduce the bloating. If you had a lower endoscopy (such as a colonoscopy or flexible sigmoidoscopy) you may notice spotting of blood in your stool or on the toilet paper. If you underwent a bowel prep for your procedure, you may not have a normal bowel movement for a few days.  Please Note:  You might notice some irritation and congestion in your nose or some drainage.  This is from the oxygen used during your procedure.  There is no need for concern and it should clear up in a day or so.  SYMPTOMS TO REPORT IMMEDIATELY:   Following lower endoscopy (colonoscopy or flexible sigmoidoscopy):  Excessive amounts of blood in the stool  Significant tenderness or worsening of abdominal pains  Swelling of the abdomen that is new, acute  Fever of 100F or higher    For urgent or emergent issues, a gastroenterologist can be reached at any hour by calling [phone].   DIET: Your first meal following the procedure should be a small meal and then it is ok to progress to your normal diet. Heavy or fried foods are harder to digest and may make you feel nauseous or bloated.  Likewise, meals heavy in dairy and vegetables can increase bloating.  Drink plenty of fluids but you should avoid alcoholic beverages for 24  hours.  ACTIVITY:  You should plan to take it easy for the rest of today and you should NOT DRIVE or use heavy machinery until tomorrow (because of the sedation medicines used during the test).    FOLLOW UP: Our staff will call the number listed on your records the next business day following your procedure to check on you and address any questions or concerns that you may have regarding the information given to you following your procedure. If we do not reach you, we will leave a message.  However, if you are feeling well and you are not experiencing any problems, there is no need to return our call.  We will assume that you have returned to your regular daily activities without incident.  If any biopsies were taken you will be contacted by phone or by letter within the next 1-3 weeks.  Please call us at [phone] if you have not heard about the biopsies in 3 weeks.    SIGNATURES/CONFIDENTIALITY: You and/or your care partner have signed paperwork which will be entered into your electronic medical record.  These signatures attest to the fact that that the information above on your After Visit Summary has been reviewed and is understood.  Full responsibility of the confidentiality of this discharge information lies with you and/or your care-partner.   Resume medications. Information given on polyps,diverticulosis,hemorrhoids and high fiber diet. 

## 2016-06-29 NOTE — Progress Notes (Signed)
To PACU awake with spont resp  Report to RN

## 2016-06-29 NOTE — Op Note (Signed)
[facility] Patient Name: Eric Frederick Procedure Date: 06/29/2016 9:38 AM MRN: [id number] Endoscopist: Ladene Artist , MD Age: 50 Referring MD:  Date of Birth: 12-28-1965 Gender: Male Account #: [id number] Procedure:                Colonoscopy Indications:              Screening for colorectal malignant neoplasm Medicines:                Monitored Anesthesia Care Procedure:                Pre-Anesthesia Assessment:                           - Prior to the procedure, a History and Physical                            was performed, and patient medications and                            allergies were reviewed. The patient's tolerance of                            previous anesthesia was also reviewed. The risks                            and benefits of the procedure and the sedation                            options and risks were discussed with the patient.                            All questions were answered, and informed consent                            was obtained. Prior Anticoagulants: The patient has                            taken no previous anticoagulant or antiplatelet                            agents. ASA Grade Assessment: II - A patient with                            mild systemic disease. After reviewing the risks                            and benefits, the patient was deemed in                            satisfactory condition to undergo the procedure.                           After obtaining informed consent, the colonoscope  was passed under direct vision. Throughout the                            procedure, the patient's blood pressure, pulse, and                            oxygen saturations were monitored continuously. The                            Model PCF-[id number]) scope was introduced                            through the anus and advanced to the the cecum,                            identified by  appendiceal orifice and ileocecal                            valve. The ileocecal valve, appendiceal orifice,                            and rectum were photographed. The quality of the                            bowel preparation was excellent. The colonoscopy                            was performed without difficulty. The patient                            tolerated the procedure well. Scope In: 9:47:54 AM Scope Out: 10:02:54 AM Scope Withdrawal Time: 0 hours 13 minutes 5 seconds  Total Procedure Duration: 0 hours 15 minutes 0 seconds  Findings:                 Four sessile polyps were found in the sigmoid colon                            (2) descending colon (1) and cecum (1). The polyps                            were 4 to 5 mm in size. These polyps were removed                            with a cold biopsy forceps. Resection and retrieval                            were complete.                           Internal hemorrhoids were found during                            retroflexion. The hemorrhoids were small and Grade  I (internal hemorrhoids that do not prolapse).                           The exam was otherwise without abnormality on                            direct and retroflexion views.                           Many medium-mouthed diverticula were found in the                            sigmoid colon, descending colon and ascending                            colon. There was no evidence of diverticular                            bleeding. Complications:            No immediate complications. Estimated blood loss:                            None. Estimated Blood Loss:     Estimated blood loss: none. Impression:               - Four 4 to 5 mm polyps in the sigmoid colon, in                            the descending colon and in the cecum, removed with                            a cold biopsy forceps. Resected and retrieved.                            - Internal hemorrhoids.                           - Moderate diverticulosis in the sigmoid colon, in                            the descending colon and in the ascending colon.                            There was no evidence of diverticular bleeding. Recommendation:           - Repeat colonoscopy in 5 years for surveillance if                            polyp(s) are precancerous, otherwise 10 years.                           - Patient has a contact number available for  emergencies. The signs and symptoms of potential                            delayed complications were discussed with the                            patient. Return to normal activities tomorrow.                            Written discharge instructions were provided to the                            patient.                           - High fiber diet.                           - Continue present medications.                           - Await pathology results. Ladene Artist, MD 06/29/2016 10:08:47 AM This report has been signed electronically.

## 2016-06-30 ENCOUNTER — Telehealth: Payer: Self-pay | Admitting: *Deleted

## 2016-06-30 NOTE — Telephone Encounter (Signed)
  Follow up Call-  Call back number 06/29/2016  Post procedure Call Back phone  # [phone]  Permission to leave phone message Yes     Patient questions:  Do you have a fever, pain , or abdominal swelling? No. Pain Score  0 *  Have you tolerated food without any problems? Yes.    Have you been able to return to your normal activities? Yes.    Do you have any questions about your discharge instructions: Diet   No. Medications  No. Follow up visit  No.  Do you have questions or concerns about your Care? No.  Actions: * If pain score is 4 or above: No action needed, pain <4.

## 2016-07-07 ENCOUNTER — Encounter: Payer: Self-pay | Admitting: Gastroenterology

## 2016-11-09 ENCOUNTER — Encounter: Payer: Commercial Managed Care - HMO | Admitting: Family Medicine

## 2016-11-20 ENCOUNTER — Other Ambulatory Visit: Payer: Self-pay | Admitting: Family Medicine

## 2016-11-30 LAB — HM DIABETES EYE EXAM

## 2016-12-17 ENCOUNTER — Other Ambulatory Visit: Payer: Self-pay | Admitting: Family Medicine

## 2017-06-07 ENCOUNTER — Other Ambulatory Visit: Payer: Self-pay | Admitting: *Deleted

## 2017-06-07 MED ORDER — OMEPRAZOLE 40 MG PO CPDR: 40.0000 mg | DELAYED_RELEASE_CAPSULE | Freq: Every day | ORAL | 0 refills | Status: DC

## 2017-06-07 MED ORDER — PRAVASTATIN SODIUM 10 MG PO TABS
10.0000 mg | ORAL_TABLET | Freq: Every day | ORAL | 0 refills | Status: DC
Start: 2017-06-07 — End: 2017-09-19

## 2017-06-07 MED ORDER — AMLODIPINE BESYLATE 5 MG PO TABS: 5.0000 mg | ORAL_TABLET | Freq: Every day | ORAL | 0 refills | Status: DC

## 2017-06-07 MED ORDER — METFORMIN HCL 1000 MG PO TABS: 1000.0000 mg | ORAL_TABLET | Freq: Two times a day (BID) | ORAL | 0 refills | Status: DC

## 2017-06-07 MED ORDER — LISINOPRIL 40 MG PO TABS: ORAL_TABLET | ORAL | 0 refills | Status: DC

## 2017-06-16 ENCOUNTER — Ambulatory Visit
Admission: RE | Admit: 2017-06-16 | Discharge: 2017-06-16 | Disposition: A | Discharge status: Home / Self Care | Payer: Self-pay | Source: Ambulatory Visit | Attending: Nurse Practitioner | Admitting: Nurse Practitioner

## 2017-06-16 ENCOUNTER — Other Ambulatory Visit: Payer: Self-pay | Admitting: Nurse Practitioner

## 2017-06-16 DIAGNOSIS — M25512 Pain in left shoulder: Secondary | ICD-10-CM

## 2017-06-16 DIAGNOSIS — W19XXXA Unspecified fall, initial encounter: Secondary | ICD-10-CM

## 2017-06-28 ENCOUNTER — Other Ambulatory Visit (INDEPENDENT_AMBULATORY_CARE_PROVIDER_SITE_OTHER): Payer: 59

## 2017-06-28 DIAGNOSIS — E119 Type 2 diabetes mellitus without complications: Secondary | ICD-10-CM

## 2017-06-28 DIAGNOSIS — Z0000 Encounter for general adult medical examination without abnormal findings: Secondary | ICD-10-CM | POA: Diagnosis not present

## 2017-06-28 LAB — LIPID PANEL
Cholesterol: 186 mg/dL (ref 0–200)
HDL: 39.7 mg/dL (ref >39.00)
LDL CALC: 127 mg/dL — AB (ref 0–99)
NONHDL: 146.41
Total CHOL/HDL Ratio: 5
Triglycerides: 98 mg/dL (ref 0.0–149.0)
VLDL: 19.6 mg/dL (ref 0.0–40.0)

## 2017-06-28 LAB — CBC WITH DIFFERENTIAL/PLATELET
BASOS ABS: 0 10*3/uL (ref 0.0–0.1)
Basophils Relative: 0.3 % (ref 0.0–3.0)
Eosinophils Absolute: 0.3 10*3/uL (ref 0.0–0.7)
Eosinophils Relative: 3.9 % (ref 0.0–5.0)
HCT: 47.3 % (ref 39.0–52.0)
Hemoglobin: 15.5 g/dL (ref 13.0–17.0)
LYMPHS ABS: 2.4 10*3/uL (ref 0.7–4.0)
Lymphocytes Relative: 30.6 % (ref 12.0–46.0)
MCHC: 32.8 g/dL (ref 30.0–36.0)
MCV: 89.3 fl (ref 78.0–100.0)
MONO ABS: 0.6 10*3/uL (ref 0.1–1.0)
Monocytes Relative: 7.9 % (ref 3.0–12.0)
NEUTROS ABS: 4.4 10*3/uL (ref 1.4–7.7)
NEUTROS PCT: 57.3 % (ref 43.0–77.0)
PLATELETS: 286 10*3/uL (ref 150.0–400.0)
RBC: 5.3 Mil/uL (ref 4.22–5.81)
RDW: 13.4 % (ref 11.5–15.5)
WBC: 7.7 10*3/uL (ref 4.0–10.5)

## 2017-06-28 LAB — MICROALBUMIN / CREATININE URINE RATIO
CREATININE, U: 67.8 mg/dL
MICROALB UR: 2.2 mg/dL — AB (ref 0.0–1.9)
Microalb Creat Ratio: 3.2 mg/g (ref 0.0–30.0)

## 2017-06-28 LAB — BASIC METABOLIC PANEL
BUN: 16 mg/dL (ref 6–23)
CALCIUM: 9.8 mg/dL (ref 8.4–10.5)
CHLORIDE: 97 meq/L (ref 96–112)
CO2: 28 meq/L (ref 19–32)
Creatinine, Ser: 0.74 mg/dL (ref 0.40–1.50)
GFR: 143.28 mL/min (ref >60.00)
GLUCOSE: 346 mg/dL — AB (ref 70–99)
Potassium: 4.6 mEq/L (ref 3.5–5.1)
SODIUM: 136 meq/L (ref 135–145)

## 2017-06-28 LAB — HEPATIC FUNCTION PANEL
ALK PHOS: 104 U/L (ref 39–117)
ALT: 15 U/L (ref 0–53)
AST: 10 U/L (ref 0–37)
Albumin: 4.6 g/dL (ref 3.5–5.2)
BILIRUBIN TOTAL: 0.4 mg/dL (ref 0.2–1.2)
Bilirubin, Direct: 0.1 mg/dL (ref 0.0–0.3)
Total Protein: 6.7 g/dL (ref 6.0–8.3)

## 2017-06-28 LAB — PSA: PSA: 1.06 ng/mL (ref 0.10–4.00)

## 2017-06-28 LAB — HEMOGLOBIN A1C: HEMOGLOBIN A1C: 10.1 % — AB (ref 4.6–6.5)

## 2017-06-29 ENCOUNTER — Encounter: Payer: Self-pay | Admitting: Family Medicine

## 2017-06-29 ENCOUNTER — Ambulatory Visit (INDEPENDENT_AMBULATORY_CARE_PROVIDER_SITE_OTHER): Payer: 59 | Admitting: Family Medicine

## 2017-06-29 VITALS — BP 146/80 | HR 111 | Temp 98.3°F | Ht 65.25 in | Wt 255.5 lb

## 2017-06-29 DIAGNOSIS — E7800 Pure hypercholesterolemia, unspecified: Secondary | ICD-10-CM | POA: Diagnosis not present

## 2017-06-29 DIAGNOSIS — E1159 Type 2 diabetes mellitus with other circulatory complications: Secondary | ICD-10-CM

## 2017-06-29 DIAGNOSIS — B360 Pityriasis versicolor: Secondary | ICD-10-CM | POA: Diagnosis not present

## 2017-06-29 DIAGNOSIS — N521 Erectile dysfunction due to diseases classified elsewhere: Secondary | ICD-10-CM

## 2017-06-29 DIAGNOSIS — Z0000 Encounter for general adult medical examination without abnormal findings: Secondary | ICD-10-CM

## 2017-06-29 DIAGNOSIS — Z9119 Patient's noncompliance with other medical treatment and regimen: Secondary | ICD-10-CM

## 2017-06-29 DIAGNOSIS — I10 Essential (primary) hypertension: Secondary | ICD-10-CM | POA: Diagnosis not present

## 2017-06-29 DIAGNOSIS — R1310 Dysphagia, unspecified: Secondary | ICD-10-CM | POA: Diagnosis not present

## 2017-06-29 DIAGNOSIS — R1319 Other dysphagia: Secondary | ICD-10-CM

## 2017-06-29 DIAGNOSIS — Z91199 Patient's noncompliance with other medical treatment and regimen due to unspecified reason: Secondary | ICD-10-CM

## 2017-06-29 MED ORDER — INSULIN PEN NEEDLE 30G X 8 MM MISC: 0 refills | Status: DC

## 2017-06-29 MED ORDER — RANITIDINE HCL 150 MG PO TABS: 150.0000 mg | ORAL_TABLET | Freq: Two times a day (BID) | ORAL | 3 refills | Status: DC

## 2017-06-29 MED ORDER — DULAGLUTIDE 0.75 MG/0.5ML ~~LOC~~ SOAJ: SUBCUTANEOUS | 3 refills | Status: DC

## 2017-06-29 MED ORDER — FLUCONAZOLE 150 MG PO TABS: ORAL_TABLET | ORAL | 0 refills | Status: DC

## 2017-06-29 NOTE — Progress Notes (Signed)
Dr. Frederico Hamman T. Edin Kon, MD, [facility] [place], [address] Phone: [phone] Fax: [phone]  06/29/2017  Patient: Eric Frederick, MRN: [id number], DOB: 13-Jan-1966, 51 y.o.  Primary Physician:  Owens Loffler, MD   Chief Complaint  Patient presents with  . Annual Exam   Subjective:   Eric Frederick is a 51 y.o. pleasant patient who presents with the following:  Preventative Health Maintenance Visit as well as multiple acute and chronic issues.  Health Maintenance Summary Reviewed and updated, unless pt declines services.  Tobacco History Reviewed. Alcohol: No concerns, no excessive use Exercise Habits: Some activity, rec at least 30 mins 5 times a week STD concerns: no risk or activity to increase risk Drug Use: None Encouraged self-testicular check  Morbid obesity: Lost 26 pounds. High of 296 Down 46 pounds since his [employer].  Diabetes Mellitus: Tolerating Medications: he stopped all insulin close to a year ago, and has only been taking metformin at thousand milligrams p.o. B.i.d. Compliance with diet: fair Exercise: minimal / intermittent Avg blood sugars at home: not checking Foot problems: none Hypoglycemia: none No nausea, vomitting, blurred vision, polyuria.  Lab Results  Component Value Date   HGBA1C 10.1 (H) 06/28/2017   HGBA1C 5.8 04/27/2016   HGBA1C 7.1 (H) 10/23/2015   Lab Results  Component Value Date   MICROALBUR 2.2 (H) 06/28/2017   LDLCALC 127 (H) 06/28/2017   CREATININE 0.74 06/28/2017    Wt Readings from Last 3 Encounters:  06/29/17 255 lb 8 oz (115.9 kg)  06/29/16 281 lb (127.5 kg)  06/08/16 281 lb (127.5 kg)    Body mass index is 42.19 kg/m.   Dysphagia with eating something. He will intermittently have dysphagia and have a feeling like he is having food stuck in his throat and it is lower esophagus when he eats more solid foods.  Tinea versicolor. He is  intermittently had a problem with tinea versicolor, and this is come back and is in full force along his arms and back and neck.  He continues to have reflux, and he is had some difficulty with Prilosec in that when he forgets to take it he has a rebound of symptoms.  He requests to go back on and H2 blocker  Medical noncompliance.  As above.  The patient also stopped his lisinopril 40 mg.  He now is taking only Norvasc 5 mg a day for his blood pressure.  Health Maintenance  Topic Date Due  . HIV Screening  04/10/1981  . OPHTHALMOLOGY EXAM  03/16/2015  . FOOT EXAM  10/27/2016  . INFLUENZA VACCINE  07/12/2017  . HEMOGLOBIN A1C  12/29/2017  . PNEUMOCOCCAL POLYSACCHARIDE VACCINE (2) 03/13/2018  . COLONOSCOPY  06/29/2021  . TETANUS/TDAP  03/14/2023   Immunization History  Administered Date(s) Administered  . Influenza Split 09/12/2012  . Influenza,inj,Quad PF,36+ Mos 10/22/2014, 10/28/2015  . Pneumococcal Conjugate-13 10/22/2014  . Pneumococcal Polysaccharide-23 03/13/2013  . Tdap 03/13/2013   Patient Active Problem List   Diagnosis Date Noted  . Hiatal hernia 04/22/2015  . Severe obesity (BMI >= 40) ([place]) 03/12/2014  . History of noncompliance with medical treatment 03/14/2012  . Erectile dysfunction 03/02/2011  . Type 2 diabetes with circulatory disorder causing erectile dysfunction ([place]) 07/21/2010  . HYPERCHOLESTEROLEMIA 07/21/2010  . Essential hypertension 07/21/2010   Past Medical History:  Diagnosis Date  . Diabetes mellitus   . GERD (gastroesophageal reflux disease)   . Hiatal hernia 04/22/2015  .  Hyperlipidemia   . Hypertension    Past Surgical History:  Procedure Laterality Date  . SHOULDER ARTHROSCOPY W/ ROTATOR CUFF REPAIR Right 2014   [place]   Social History   Social History  . Marital status: Married    Spouse name: N/A  . Number of children: N/A  . Years of education: N/A   Occupational History  . Not on file.   Social History Main Topics  .  Smoking status: Former Smoker    Types: Cigarettes    Quit date: 03/02/1991  . Smokeless tobacco: Never Used  . Alcohol use No  . Drug use: No  . Sexual activity: Not on file   Other Topics Concern  . Not on file   Social History Narrative  . No narrative on file   Family History  Problem Relation Age of Onset  . Colon cancer Neg Hx    No Known Allergies  Medication list has been reviewed and updated.   General: Denies fever, chills, sweats. No significant weight loss. Eyes: Denies blurring,significant itching ENT: Denies earache, sore throat, and hoarseness. Cardiovascular: Denies chest pains, palpitations, dyspnea on exertion Respiratory: Denies cough, dyspnea at rest,wheeezing Breast: no concerns about lumps GI: intermittent dysphasia, intermittent GERD GU: Denies penile discharge, ED, urinary flow / outflow problems. No STD concerns. Musculoskeletal: Denies back pain, joint pain Derm: scattered tinea versicolor Neuro: Denies  paresthesias, frequent falls, frequent headaches Psych: Denies depression, anxiety Endocrine: Denies cold intolerance, heat intolerance, polydipsia. Poor DM med compliance Heme: Denies enlarged lymph nodes Allergy: No hayfever  Objective:   BP (!) 146/80   Pulse (!) 111   Temp 98.3 F (36.8 C) (Oral)   Ht 5' 5.25" (1.657 m)   Wt 255 lb 8 oz (115.9 kg)   BMI 42.19 kg/m  Ideal Body Weight: Weight in (lb) to have BMI = 25: 151.1  No exam data present  GEN: well developed, well nourished, no acute distress Eyes: conjunctiva and lids normal, PERRLA, EOMI ENT: TM clear, nares clear, oral exam WNL Neck: supple, no lymphadenopathy, no thyromegaly, no JVD Pulm: clear to auscultation and percussion, respiratory effort normal CV: regular rate and rhythm, S1-S2, no murmur, rub or gallop, no bruits, peripheral pulses normal and symmetric, no cyanosis, clubbing, edema or varicosities GI: soft, non-tender; no hepatosplenomegaly, masses; active  bowel sounds all quadrants GU: no hernia, testicular mass, penile discharge Lymph: no cervical, axillary or inguinal adenopathy MSK: gait normal, muscle tone and strength WNL, no joint swelling, effusions, discoloration, crepitus  SKIN: flat rash c/w tinea versicolor Neuro: normal mental status, normal strength, sensation, and motion Psych: alert; oriented to person, place and time, normally interactive and not anxious or depressed in appearance.  All labs reviewed with patient.  Lipids:    Component Value Date/Time   CHOL 186 06/28/2017 0845   TRIG 98.0 06/28/2017 0845   HDL 39.70 06/28/2017 0845   LDLDIRECT 144.3 03/12/2014 1619   VLDL 19.6 06/28/2017 0845   CHOLHDL 5 06/28/2017 0845   CBC: CBC Latest Ref Rng & Units 06/28/2017 10/23/2015 03/12/2014  WBC 4.0 - 10.5 K/uL 7.7 7.6 9.4  Hemoglobin 13.0 - 17.0 g/dL 15.5 14.7 15.2  Hematocrit 39.0 - 52.0 % 47.3 46.5 46.3  Platelets 150.0 - 400.0 K/uL 286.0 315.0 161.0    Basic Metabolic Panel:    Component Value Date/Time   NA 136 06/28/2017 0845   NA 137 10/29/2012 0146   K 4.6 06/28/2017 0845   K 3.4 (L) 10/29/2012 0146  CL 97 06/28/2017 0845   CL 102 10/29/2012 0146   CO2 28 06/28/2017 0845   CO2 26 10/29/2012 0146   BUN 16 06/28/2017 0845   BUN 13 10/29/2012 0146   CREATININE 0.74 06/28/2017 0845   CREATININE 0.91 10/29/2012 0146   GLUCOSE 346 (H) 06/28/2017 0845   GLUCOSE 203 (H) 10/29/2012 0146   CALCIUM 9.8 06/28/2017 0845   CALCIUM 7.9 (L) 10/29/2012 0146   Hepatic Function Latest Ref Rng & Units 06/28/2017 10/23/2015 03/12/2014  Total Protein 6.0 - 8.3 g/dL 6.7 6.9 7.4  Albumin 3.5 - 5.2 g/dL 4.6 4.2 4.5  AST 0 - 37 U/L 10 16 15   ALT 0 - 53 U/L 15 24 22   Alk Phosphatase 39 - 117 U/L 104 97 106  Total Bilirubin 0.2 - 1.2 mg/dL 0.4 0.4 0.8  Bilirubin, Direct 0.0 - 0.3 mg/dL 0.1 0.1 0.1    Lab Results  Component Value Date   TSH 0.74 07/28/2010   Lab Results  Component Value Date   PSA 1.06 06/28/2017    PSA 0.93 10/28/2015   PSA 0.99 04/15/2015    Assessment and Plan:   Healthcare maintenance  Esophageal dysphagia - Plan: Ambulatory referral to Gastroenterology  Type 2 diabetes with circulatory disorder causing erectile dysfunction ([place])  HYPERCHOLESTEROLEMIA  Essential hypertension  History of noncompliance with medical treatment  Severe obesity (BMI >= 40) ([facility])  Tinea versicolor   >15 minutes spent in face to face time with patient, >50% spent in counselling or coordination of care:   Multiple issues overnight above health maintenance.  Patient was on fairly high doses of insulin, and he stopped this completely.  His A1c is greater than 10.  His goal is to get off of all medications, and I do not think that this is possible.  I convinced him to at least go on some trulicity in the meantime with closer follow-up. We will continue this along with metformin.  Hypertension: Currently not at goal, stopped his ACE inhibitor.  Again a challenge with compliance.  Knee and versicolor: Treat with Selsun Blue and oral Diflucan.  Dysphagia: Consult gastroenterology.  Health Maintenance Exam: The patient's preventative maintenance and recommended screening tests for an annual wellness exam were reviewed in full today. Brought up to date unless services declined.  Counselled on the importance of diet, exercise, and its role in overall health and mortality. The patient's FH and SH was reviewed, including their home life, tobacco status, and drug and alcohol status.  Follow-up in 1 year for physical exam or additional follow-up below.  Follow-up: Return in about 3 months (around 09/29/2017). Or follow-up in 1 year if not noted.  Meds ordered this encounter  Medications  . ranitidine (ZANTAC) 150 MG tablet    Sig: Take 1 tablet (150 mg total) by mouth 2 (two) times daily.    Dispense:  180 tablet    Refill:  3  . fluconazole (DIFLUCAN) 150 MG tablet    Sig: 2 tabs po once a week  for 3 weeks    Dispense:  6 tablet    Refill:  0  . Dulaglutide (TRULICITY) 4.33 IR/5.1OA SOPN    Sig: Inject one pen  once a week    Dispense:  4 pen    Refill:  3  . Insulin Pen Needle (NOVOFINE) 30G X 8 MM MISC    Sig: Use to inject Trulicity once weekly    Dispense:  90 each    Refill:  0  Medications Discontinued During This Encounter  Medication Reason  . lisinopril (PRINIVIL,ZESTRIL) 40 MG tablet   . Insulin Glargine (BASAGLAR KWIKPEN) 100 UNIT/ML SOPN   . Insulin Pen Needle 31G X 6 MM MISC   . omeprazole (PRILOSEC) 40 MG capsule    Orders Placed This Encounter  Procedures  . Ambulatory referral to Gastroenterology    Signed,  Frederico Hamman T. Tyniah Kastens, MD   Allergies as of 06/29/2017   No Known Allergies     Medication List       Accurate as of 06/29/17 11:59 PM. Always use your most recent med list.          amLODipine 5 MG tablet Commonly known as:  NORVASC Take 1 tablet (5 mg total) by mouth daily.   Dulaglutide 0.75 MG/0.5ML Sopn Commonly known as:  TRULICITY Inject one pen [place] once a week   fluconazole 150 MG tablet Commonly known as:  DIFLUCAN 2 tabs po once a week for 3 weeks   Insulin Pen Needle 30G X 8 MM Misc Commonly known as:  NOVOFINE Use to inject Trulicity once weekly   ketoconazole 200 MG tablet Commonly known as:  NIZORAL Reported on 06/29/2016   metFORMIN 1000 MG tablet Commonly known as:  GLUCOPHAGE Take 1 tablet (1,000 mg total) by mouth 2 (two) times daily with a meal.   pravastatin 10 MG tablet Commonly known as:  PRAVACHOL Take 1 tablet (10 mg total) by mouth daily.   ranitidine 150 MG tablet Commonly known as:  ZANTAC Take 1 tablet (150 mg total) by mouth 2 (two) times daily.

## 2017-06-29 NOTE — Patient Instructions (Addendum)
Selsun Blue shampoo. Apply in the shower for 1 minute, then wash off.

## 2017-07-03 ENCOUNTER — Ambulatory Visit (INDEPENDENT_AMBULATORY_CARE_PROVIDER_SITE_OTHER): Payer: 59 | Admitting: Gastroenterology

## 2017-07-03 ENCOUNTER — Encounter: Payer: Self-pay | Admitting: Family Medicine

## 2017-07-03 ENCOUNTER — Encounter: Payer: Self-pay | Admitting: Gastroenterology

## 2017-07-03 VITALS — BP 150/80 | HR 84 | Ht 66.0 in | Wt 261.0 lb

## 2017-07-03 DIAGNOSIS — R1011 Right upper quadrant pain: Secondary | ICD-10-CM

## 2017-07-03 DIAGNOSIS — R1310 Dysphagia, unspecified: Secondary | ICD-10-CM | POA: Diagnosis not present

## 2017-07-03 DIAGNOSIS — K219 Gastro-esophageal reflux disease without esophagitis: Secondary | ICD-10-CM | POA: Diagnosis not present

## 2017-07-03 DIAGNOSIS — Z86010 Personal history of colonic polyps: Secondary | ICD-10-CM

## 2017-07-03 NOTE — Progress Notes (Addendum)
    History of Present Illness: This is a 51 year old male with chronic GERD, RUQ pain and dysphagia. He is accompanied by his wife. He relates a two-year history of frequent difficulty swallowing solid foods such as steak and chicken. This is associated with right upper quadrant pain. He has frequent heartburn. Taking omeprazole has helped all the symptoms but all symptoms persist. He was recently switched to ranitidine but no change in symptoms. EGD in 2005 showed on esophageal stricture, a hiatal hernia and suspected Barrett's however esophageal biopsies did not reveal Barrett's. Esophageal stricture dilation was performed.   Current Medications, Allergies, Past Medical History, Past Surgical History, Family History and Social History were reviewed in [employer] record.  Physical Exam: General: Well developed, well nourished, no acute distress Head: Normocephalic and atraumatic Eyes:  sclerae anicteric, EOMI Ears: Normal auditory acuity Mouth: No deformity or lesions Lungs: Clear throughout to auscultation Heart: Regular rate and rhythm; no murmurs, rubs or bruits Abdomen: Soft, non tender and non distended. No masses, hepatosplenomegaly or hernias noted. Normal Bowel sounds Musculoskeletal: Symmetrical with no gross deformities  Pulses:  Normal pulses noted Extremities: No clubbing, cyanosis, edema or deformities noted Neurological: Alert oriented x 4, grossly nonfocal Psychological:  Alert and cooperative. Normal mood and affect  Assessment and Recommendations:  1. GERD, dysphagia, history of an esophageal stricture, RUQ pain. R/O esophagitis, stricture, neoplasm, ulcer, cholelithiasis. Continue omeprazole 20 mg twice daily. May use ranitidine twice a day when necessary. Follow antireflux measures. Schedule abd [place], barium esophagram and EGD. The risks (including bleeding, perforation, infection, missed lesions, medication reactions and possible hospitalization or  surgery if complications occur), benefits, and alternatives to endoscopy with possible biopsy and possible dilation were discussed with the patient and they consent to proceed.   2. Personal history of precancerous colon polyps. A five-year interval surveillance colonoscopy is recommended in July 2022.

## 2017-07-03 NOTE — Patient Instructions (Signed)
You have been scheduled for an endoscopy. Please follow written instructions given to you at your visit today. If you use inhalers (even only as needed), please bring them with you on the day of your procedure. Your physician has requested that you go to www.startemmi.com and enter the access code given to you at your visit today. This web site gives a general overview about your procedure. However, you should still follow specific instructions given to you by our office regarding your preparation for the procedure.  ________________________________________________________________________   You have been scheduled for a Barium Esophogram at [facility] (1st floor of the hospital) on 07-19-17 at 9:30am. Please arrive 15 minutes prior to your appointment for registration. Make certain not to have anything to eat or drink 6 hours prior to your test. If you need to reschedule for any reason, please contact radiology at [phone] to do so. __________________________________________________________________ A barium swallow is an examination that concentrates on views of the esophagus. This tends to be a double contrast exam (barium and two liquids which, when combined, create a gas to distend the wall of the oesophagus) or single contrast (non-ionic iodine based). The study is usually tailored to your symptoms so a good history is essential. Attention is paid during the study to the form, structure and configuration of the esophagus, looking for functional disorders (such as aspiration, dysphagia, achalasia, motility and reflux) EXAMINATION You may be asked to change into a gown, depending on the type of swallow being performed. A radiologist and radiographer will perform the procedure. The radiologist will advise you of the type of contrast selected for your procedure and direct you during the exam. You will be asked to stand, sit or lie in several different positions and to hold a small amount  of fluid in your mouth before being asked to swallow while the imaging is performed .In some instances you may be asked to swallow barium coated marshmallows to assess the motility of a solid food bolus. The exam can be recorded as a digital or video fluoroscopy procedure. POST PROCEDURE It will take 1-2 days for the barium to pass through your system. To facilitate this, it is important, unless otherwise directed, to increase your fluids for the next 24-48hrs and to resume your normal diet.  This test typically takes about 30 minutes to perform. _________________________________________________________________________   Dennis Bast have been scheduled for an abdominal ultrasound at [facility] (1st floor of hospital) on 07-19-17 at 9:00am. Please arrive 15 minutes prior to your appointment for registration. Make certain not to have anything to eat or drink 6 hours prior to your appointment. Should you need to reschedule your appointment, please contact radiology at [phone]. This test typically takes about 30 minutes to perform.  _________________________________________________________________________  Normal BMI (Body Mass Index- based on height and weight) is between 19 and 25. Your BMI today is Body mass index is 42.13 kg/m. Marland Kitchen Please consider follow up  regarding your BMI with your Primary Care Provider.  Thank you for choosing me and [facility].  Pricilla Riffle. Dagoberto Ligas., MD., Marval Regal

## 2017-07-19 ENCOUNTER — Ambulatory Visit (HOSPITAL_COMMUNITY)
Admission: RE | Admit: 2017-07-19 | Discharge: 2017-07-19 | Disposition: A | Discharge status: Home / Self Care | Payer: 59 | Source: Ambulatory Visit | Attending: Gastroenterology | Admitting: Gastroenterology

## 2017-07-19 DIAGNOSIS — K224 Dyskinesia of esophagus: Secondary | ICD-10-CM | POA: Insufficient documentation

## 2017-07-19 DIAGNOSIS — K449 Diaphragmatic hernia without obstruction or gangrene: Secondary | ICD-10-CM | POA: Diagnosis not present

## 2017-07-19 DIAGNOSIS — K219 Gastro-esophageal reflux disease without esophagitis: Secondary | ICD-10-CM

## 2017-07-19 DIAGNOSIS — R1011 Right upper quadrant pain: Secondary | ICD-10-CM | POA: Insufficient documentation

## 2017-07-19 DIAGNOSIS — R1310 Dysphagia, unspecified: Secondary | ICD-10-CM

## 2017-07-20 ENCOUNTER — Encounter: Payer: Self-pay | Admitting: Gastroenterology

## 2017-08-03 ENCOUNTER — Encounter: Payer: Self-pay | Admitting: Gastroenterology

## 2017-09-19 ENCOUNTER — Other Ambulatory Visit: Payer: Self-pay | Admitting: Family Medicine

## 2017-10-04 ENCOUNTER — Ambulatory Visit (INDEPENDENT_AMBULATORY_CARE_PROVIDER_SITE_OTHER): Payer: 59 | Admitting: Family Medicine

## 2017-10-04 ENCOUNTER — Encounter: Payer: Self-pay | Admitting: Family Medicine

## 2017-10-04 VITALS — BP 138/80 | HR 97 | Temp 98.8°F | Ht 65.25 in | Wt 260.2 lb

## 2017-10-04 DIAGNOSIS — E1159 Type 2 diabetes mellitus with other circulatory complications: Secondary | ICD-10-CM | POA: Diagnosis not present

## 2017-10-04 DIAGNOSIS — Z23 Encounter for immunization: Secondary | ICD-10-CM

## 2017-10-04 DIAGNOSIS — N521 Erectile dysfunction due to diseases classified elsewhere: Secondary | ICD-10-CM | POA: Diagnosis not present

## 2017-10-04 DIAGNOSIS — I10 Essential (primary) hypertension: Secondary | ICD-10-CM | POA: Diagnosis not present

## 2017-10-04 LAB — HEMOGLOBIN A1C: Hgb A1c MFr Bld: 7.3 % — ABNORMAL HIGH (ref 4.6–6.5)

## 2017-10-04 LAB — BASIC METABOLIC PANEL
BUN: 10 mg/dL (ref 6–23)
CO2: 29 mEq/L (ref 19–32)
Calcium: 9.5 mg/dL (ref 8.4–10.5)
Chloride: 102 mEq/L (ref 96–112)
Creatinine, Ser: 0.65 mg/dL (ref 0.40–1.50)
GFR: 166.24 mL/min (ref >60.00)
GLUCOSE: 204 mg/dL — AB (ref 70–99)
POTASSIUM: 4.4 meq/L (ref 3.5–5.1)
SODIUM: 139 meq/L (ref 135–145)

## 2017-10-04 MED ORDER — FLUCONAZOLE 150 MG PO TABS: ORAL_TABLET | ORAL | 3 refills | Status: DC

## 2017-10-04 MED ORDER — DULAGLUTIDE 1.5 MG/0.5ML ~~LOC~~ SOAJ: SUBCUTANEOUS | 3 refills | Status: DC

## 2017-10-04 NOTE — Progress Notes (Signed)
Dr. Frederico Hamman T. Pawan Knechtel, MD, [facility] [place], [address] Phone: [phone] Fax: [phone]  10/04/2017  Patient: Eric Frederick, MRN: [id number], DOB: July 22, 1966, 51 y.o.  Primary Physician:  Owens Loffler, MD   Chief Complaint  Patient presents with  . Follow-up    3 month   Subjective:   Eric Frederick is a 51 y.o. very pleasant male patient who presents with the following:  Wt Readings from Last 3 Encounters:  10/04/17 260 lb 4 oz (118 kg)  07/03/17 261 lb (118.4 kg)  06/29/17 255 lb 8 oz (115.9 kg)    Down from 300 lbs  Diabetes Mellitus: Tolerating Medications: yes Compliance with diet: fair Exercise: minimal / intermittent Avg blood sugars at home: not checking Foot problems: none Hypoglycemia: none No nausea, vomitting, blurred vision, polyuria.  Lab Results  Component Value Date   HGBA1C 10.1 (H) 06/28/2017   HGBA1C 5.8 04/27/2016   HGBA1C 7.1 (H) 10/23/2015   Lab Results  Component Value Date   MICROALBUR 2.2 (H) 06/28/2017   LDLCALC 127 (H) 06/28/2017   CREATININE 0.74 06/28/2017    Wt Readings from Last 3 Encounters:  10/04/17 260 lb 4 oz (118 kg)  07/03/17 261 lb (118.4 kg)  06/29/17 255 lb 8 oz (115.9 kg)    Body mass index is 42.98 kg/m.   HTN: Tolerating all medications without side effects Awaiting a1c No CP, no sob. No HA.  BP Readings from Last 3 Encounters:  10/04/17 138/80  07/03/17 (!) 150/80  06/29/17 (!) 865/78    Basic Metabolic Panel:    Component Value Date/Time   NA 136 06/28/2017 0845   NA 137 10/29/2012 0146   K 4.6 06/28/2017 0845   K 3.4 (L) 10/29/2012 0146   CL 97 06/28/2017 0845   CL 102 10/29/2012 0146   CO2 28 06/28/2017 0845   CO2 26 10/29/2012 0146   BUN 16 06/28/2017 0845   BUN 13 10/29/2012 0146   CREATININE 0.74 06/28/2017 0845   CREATININE 0.91 10/29/2012 0146   GLUCOSE 346 (H) 06/28/2017 0845   GLUCOSE 203 (H) 10/29/2012  0146   CALCIUM 9.8 06/28/2017 0845   CALCIUM 7.9 (L) 10/29/2012 0146    Lipids: Doing well, stable. Tolerating meds fine with no SE. Panel reviewed with patient.  Lipids:    Component Value Date/Time   CHOL 186 06/28/2017 0845   TRIG 98.0 06/28/2017 0845   HDL 39.70 06/28/2017 0845   LDLDIRECT 144.3 03/12/2014 1619   VLDL 19.6 06/28/2017 0845   CHOLHDL 5 06/28/2017 0845    Lab Results  Component Value Date   ALT 15 06/28/2017   AST 10 06/28/2017   ALKPHOS 104 06/28/2017   BILITOT 0.4 06/28/2017     Past Medical History, Surgical History, Social History, Family History, Problem List, Medications, and Allergies have been reviewed and updated if relevant.  Patient Active Problem List   Diagnosis Date Noted  . Hiatal hernia 04/22/2015  . Severe obesity (BMI >= 40) ([place]) 03/12/2014  . History of noncompliance with medical treatment 03/14/2012  . Erectile dysfunction 03/02/2011  . Type 2 diabetes with circulatory disorder causing erectile dysfunction ([place]) 07/21/2010  . HYPERCHOLESTEROLEMIA 07/21/2010  . Essential hypertension 07/21/2010    Past Medical History:  Diagnosis Date  . Diabetes mellitus   . Diverticulosis   . GERD (gastroesophageal reflux disease)   . Hiatal hernia 04/22/2015  . Hyperlipidemia   . Hypertension   .  Serrated adenoma of colon 06/2016    Past Surgical History:  Procedure Laterality Date  . SHOULDER ARTHROSCOPY W/ ROTATOR CUFF REPAIR Right 2014   [place]    Social History   Social History  . Marital status: Married    Spouse name: Silva Bandy  . Number of children: 1  . Years of education: N/A   Occupational History  . heavy equipment operator- [employer] History Main Topics  . Smoking status: Former Smoker    Types: Cigarettes    Quit date: 03/02/1991  . Smokeless tobacco: Never Used  . Alcohol use No  . Drug use: No  . Sexual activity: Not on file   Other Topics Concern  . Not on file   Social History Narrative    . No narrative on file    Family History  Problem Relation Age of Onset  . Other Father        complications from agent orange  . Colon cancer Neg Hx     No Known Allergies  Medication list reviewed and updated in full in [place].   GEN: No acute illnesses, no fevers, chills. GI: No n/v/d, eating normally Pulm: No SOB Interactive and getting along well at home.  Otherwise, ROS is as per the HPI.  Objective:   BP 138/80   Pulse 97   Temp 98.8 F (37.1 C) (Oral)   Ht 5' 5.25" (1.657 m)   Wt 260 lb 4 oz (118 kg)   BMI 42.98 kg/m   GEN: WDWN, NAD, Non-toxic, A & O x 3 HEENT: Atraumatic, Normocephalic. Neck supple. No masses, No LAD. Ears and Nose: No external deformity. CV: RRR, No M/G/R. No JVD. No thrill. No extra heart sounds. PULM: CTA B, no wheezes, crackles, rhonchi. No retractions. No resp. distress. No accessory muscle use. EXTR: No c/c/e NEURO Normal gait.  PSYCH: Normally interactive. Conversant. Not depressed or anxious appearing.  Calm demeanor.   Laboratory and Imaging Data:  Assessment and Plan:   Type 2 diabetes with circulatory disorder causing erectile dysfunction (HCC) - Plan: Hemoglobin D6L, Basic metabolic panel  Need for prophylactic vaccination and inoculation against influenza - Plan: Flu Vaccine QUAD 36+ mos IM  Essential hypertension  50 pound weight loss BP better  Check DM labs, increase trulicity. He stopped his insulin.  Follow-up: No Follow-up on file.  No future appointments.  Meds ordered this encounter  Medications  . Dulaglutide (TRULICITY) 1.5 OV/5.6EP SOPN    Sig: 1 [place] injection q week    Dispense:  12 pen    Refill:  3  . fluconazole (DIFLUCAN) 150 MG tablet    Sig: 2 tabs po once a week for 3 weeks    Dispense:  6 tablet    Refill:  3   Medications Discontinued During This Encounter  Medication Reason  . Dulaglutide (TRULICITY) 3.29 JJ/8.8CZ SOPN   . fluconazole (DIFLUCAN) 150 MG tablet Reorder    Orders Placed This Encounter  Procedures  . Flu Vaccine QUAD 36+ mos IM  . Hemoglobin A1c  . Basic metabolic panel    Signed,  Frederico Hamman T. Syvilla Martin, MD   Allergies as of 10/04/2017   No Known Allergies     Medication List       Accurate as of 10/04/17  8:50 AM. Always use your most recent med list.          amLODipine 5 MG tablet Commonly known as:  NORVASC TAKE 1  TABLET BY MOUTH ONCE DAILY   Dulaglutide 1.5 MG/0.5ML Sopn Commonly known as:  TRULICITY 1 [place] injection q week   fluconazole 150 MG tablet Commonly known as:  DIFLUCAN 2 tabs po once a week for 3 weeks   Insulin Pen Needle 30G X 8 MM Misc Commonly known as:  NOVOFINE Use to inject Trulicity once weekly   metFORMIN 1000 MG tablet Commonly known as:  GLUCOPHAGE TAKE 1 TABLET BY MOUTH TWICE DAILY WITH MEALS   pravastatin 10 MG tablet Commonly known as:  PRAVACHOL TAKE 1 TABLET BY MOUTH ONCE DAILY   ranitidine 150 MG tablet Commonly known as:  ZANTAC Take 1 tablet (150 mg total) by mouth 2 (two) times daily.

## 2017-12-04 ENCOUNTER — Other Ambulatory Visit: Payer: Self-pay | Admitting: *Deleted

## 2017-12-04 MED ORDER — AMLODIPINE BESYLATE 5 MG PO TABS: 5.0000 mg | ORAL_TABLET | Freq: Every day | ORAL | 1 refills | Status: DC

## 2017-12-19 ENCOUNTER — Other Ambulatory Visit: Payer: Self-pay | Admitting: Family Medicine

## 2017-12-20 DIAGNOSIS — Z23 Encounter for immunization: Secondary | ICD-10-CM | POA: Diagnosis not present

## 2017-12-28 ENCOUNTER — Other Ambulatory Visit: Payer: Self-pay | Admitting: *Deleted

## 2017-12-28 MED ORDER — PRAVASTATIN SODIUM 10 MG PO TABS: 10.0000 mg | ORAL_TABLET | Freq: Every day | ORAL | 1 refills | Status: DC

## 2018-02-27 LAB — HM DIABETES EYE EXAM

## 2018-06-07 ENCOUNTER — Other Ambulatory Visit: Payer: Self-pay | Admitting: Family Medicine

## 2018-06-08 ENCOUNTER — Other Ambulatory Visit: Payer: Self-pay | Admitting: Family Medicine

## 2018-07-04 ENCOUNTER — Other Ambulatory Visit (INDEPENDENT_AMBULATORY_CARE_PROVIDER_SITE_OTHER): Payer: 59

## 2018-07-04 DIAGNOSIS — N521 Erectile dysfunction due to diseases classified elsewhere: Secondary | ICD-10-CM | POA: Diagnosis not present

## 2018-07-04 DIAGNOSIS — Z0000 Encounter for general adult medical examination without abnormal findings: Secondary | ICD-10-CM

## 2018-07-04 DIAGNOSIS — E1159 Type 2 diabetes mellitus with other circulatory complications: Secondary | ICD-10-CM | POA: Diagnosis not present

## 2018-07-04 LAB — CBC WITH DIFFERENTIAL/PLATELET
BASOS PCT: 0.4 % (ref 0.0–3.0)
Basophils Absolute: 0 10*3/uL (ref 0.0–0.1)
EOS PCT: 7.2 % — AB (ref 0.0–5.0)
Eosinophils Absolute: 0.6 10*3/uL (ref 0.0–0.7)
HCT: 43.1 % (ref 39.0–52.0)
Hemoglobin: 14.3 g/dL (ref 13.0–17.0)
LYMPHS ABS: 2.4 10*3/uL (ref 0.7–4.0)
Lymphocytes Relative: 29.6 % (ref 12.0–46.0)
MCHC: 33.2 g/dL (ref 30.0–36.0)
MCV: 89.4 fl (ref 78.0–100.0)
MONO ABS: 0.8 10*3/uL (ref 0.1–1.0)
MONOS PCT: 9.8 % (ref 3.0–12.0)
NEUTROS ABS: 4.3 10*3/uL (ref 1.4–7.7)
NEUTROS PCT: 53 % (ref 43.0–77.0)
PLATELETS: 311 10*3/uL (ref 150.0–400.0)
RBC: 4.82 Mil/uL (ref 4.22–5.81)
RDW: 12.9 % (ref 11.5–15.5)
WBC: 8.2 10*3/uL (ref 4.0–10.5)

## 2018-07-04 LAB — HEPATIC FUNCTION PANEL
ALT: 19 U/L (ref 0–53)
AST: 10 U/L (ref 0–37)
Albumin: 4.4 g/dL (ref 3.5–5.2)
Alkaline Phosphatase: 93 U/L (ref 39–117)
BILIRUBIN DIRECT: 0.1 mg/dL (ref 0.0–0.3)
BILIRUBIN TOTAL: 0.4 mg/dL (ref 0.2–1.2)
Total Protein: 6.7 g/dL (ref 6.0–8.3)

## 2018-07-04 LAB — BASIC METABOLIC PANEL
BUN: 12 mg/dL (ref 6–23)
CALCIUM: 9.3 mg/dL (ref 8.4–10.5)
CO2: 29 meq/L (ref 19–32)
Chloride: 101 mEq/L (ref 96–112)
Creatinine, Ser: 0.58 mg/dL (ref 0.40–1.50)
GFR: 189.04 mL/min (ref >60.00)
GLUCOSE: 176 mg/dL — AB (ref 70–99)
Potassium: 4.4 mEq/L (ref 3.5–5.1)
SODIUM: 139 meq/L (ref 135–145)

## 2018-07-04 LAB — HEMOGLOBIN A1C: HEMOGLOBIN A1C: 8 % — AB (ref 4.6–6.5)

## 2018-07-04 LAB — PSA: PSA: 1.01 ng/mL (ref 0.10–4.00)

## 2018-07-04 LAB — LIPID PANEL
CHOL/HDL RATIO: 3
Cholesterol: 164 mg/dL (ref 0–200)
HDL: 47 mg/dL (ref >39.00)
LDL Cholesterol: 106 mg/dL — ABNORMAL HIGH (ref 0–99)
NONHDL: 116.5
Triglycerides: 51 mg/dL (ref 0.0–149.0)
VLDL: 10.2 mg/dL (ref 0.0–40.0)

## 2018-07-04 LAB — MICROALBUMIN / CREATININE URINE RATIO
Creatinine,U: 121.5 mg/dL
Microalb Creat Ratio: 1.7 mg/g (ref 0.0–30.0)
Microalb, Ur: 2 mg/dL — ABNORMAL HIGH (ref 0.0–1.9)

## 2018-07-11 ENCOUNTER — Encounter: Payer: Self-pay | Admitting: Family Medicine

## 2018-07-11 ENCOUNTER — Ambulatory Visit (INDEPENDENT_AMBULATORY_CARE_PROVIDER_SITE_OTHER): Payer: 59 | Admitting: Family Medicine

## 2018-07-11 ENCOUNTER — Other Ambulatory Visit: Payer: Self-pay

## 2018-07-11 VITALS — BP 130/74 | HR 97 | Temp 98.9°F | Ht 65.5 in | Wt 255.5 lb

## 2018-07-11 DIAGNOSIS — Z0000 Encounter for general adult medical examination without abnormal findings: Secondary | ICD-10-CM

## 2018-07-11 MED ORDER — SILDENAFIL CITRATE 20 MG PO TABS: ORAL_TABLET | ORAL | 5 refills | Status: DC

## 2018-07-11 NOTE — Progress Notes (Signed)
Dr. Frederico Hamman T. Jourdyn Hasler, MD, [facility] [place], [address] Phone: [phone] Fax: [phone]  07/11/2018  Patient: Eric Frederick, MRN: [id number], DOB: 1966/09/02, 52 y.o.  Primary Physician:  Owens Loffler, MD   Chief Complaint  Patient presents with  . Annual Exam   Subjective:   Eric Frederick is a 52 y.o. pleasant patient who presents with the following:  Preventative Health Maintenance Visit:  Health Maintenance Summary Reviewed and updated, unless pt declines services.  Tobacco History Reviewed. Alcohol: No concerns, no excessive use Exercise Habits: Some activity, rec at least 30 mins 5 times a week STD concerns: no risk or activity to increase risk Drug Use: None Encouraged self-testicular check  Wt Readings from Last 3 Encounters:  07/11/18 255 lb 8 oz (115.9 kg)  10/04/17 260 lb 4 oz (118 kg)  07/03/17 261 lb (118.4 kg)    Feeling good doing some walking.  Work and home life are doing well.   Health Maintenance  Topic Date Due  . HIV Screening  04/10/1981  . FOOT EXAM  10/27/2016  . OPHTHALMOLOGY EXAM  11/30/2017  . PNEUMOCOCCAL POLYSACCHARIDE VACCINE (2) 03/13/2018  . INFLUENZA VACCINE  07/12/2018  . HEMOGLOBIN A1C  01/04/2019  . COLONOSCOPY  06/29/2021  . TETANUS/TDAP  03/14/2023   Immunization History  Administered Date(s) Administered  . Influenza Split 09/12/2012  . Influenza,inj,Quad PF,6+ Mos 10/22/2014, 10/28/2015, 10/04/2017  . Pneumococcal Conjugate-13 10/22/2014  . Pneumococcal Polysaccharide-23 03/13/2013  . Tdap 03/13/2013   Patient Active Problem List   Diagnosis Date Noted  . Hiatal hernia 04/22/2015  . Severe obesity (BMI >= 40) ([place]) 03/12/2014  . History of noncompliance with medical treatment 03/14/2012  . Erectile dysfunction 03/02/2011  . Type 2 diabetes with circulatory disorder causing erectile dysfunction ([place]) 07/21/2010  . HYPERCHOLESTEROLEMIA  07/21/2010  . Essential hypertension 07/21/2010   Past Medical History:  Diagnosis Date  . Diabetes mellitus   . Diverticulosis   . GERD (gastroesophageal reflux disease)   . Hiatal hernia 04/22/2015  . Hyperlipidemia   . Hypertension   . Serrated adenoma of colon 06/2016   Past Surgical History:  Procedure Laterality Date  . SHOULDER ARTHROSCOPY W/ ROTATOR CUFF REPAIR Right 2014   [place] History   Socioeconomic History  . Marital status: Married    Spouse name: Silva Bandy  . Number of children: 1  . Years of education: Not on file  . Highest education level: Not on file  Occupational History  . Occupation: heavy Company secretary- [employer]  . Financial resource strain: Not on file  . Food insecurity:    Worry: Not on file    Inability: Not on file  . Transportation needs:    Medical: Not on file    Non-medical: Not on file  Tobacco Use  . Smoking status: Former Smoker    Types: Cigarettes    Last attempt to quit: 03/02/1991    Years since quitting: 27.3  . Smokeless tobacco: Never Used  Substance and Sexual Activity  . Alcohol use: No    Alcohol/week: 0.0 oz  . Drug use: No  . Sexual activity: Not on file  Lifestyle  . Physical activity:    Days per week: Not on file    Minutes per session: Not on file  . Stress: Not on file  Relationships  . Social connections:    Talks on phone: Not  on file    Gets together: Not on file    Attends religious service: Not on file    Active member of club or organization: Not on file    Attends meetings of clubs or organizations: Not on file    Relationship status: Not on file  . Intimate partner violence:    Fear of current or ex partner: Not on file    Emotionally abused: Not on file    Physically abused: Not on file    Forced sexual activity: Not on file  Other Topics Concern  . Not on file  Social History Narrative  . Not on file   Family History  Problem Relation Age of Onset  .  Other Father        complications from agent orange  . Colon cancer Neg Hx    No Known Allergies  Medication list has been reviewed and updated.   General: Denies fever, chills, sweats. No significant weight loss. Eyes: Denies blurring,significant itching ENT: Denies earache, sore throat, and hoarseness. Cardiovascular: Denies chest pains, palpitations, dyspnea on exertion Respiratory: Denies cough, dyspnea at rest,wheeezing Breast: no concerns about lumps GI: Denies nausea, vomiting, diarrhea, constipation, change in bowel habits, abdominal pain, melena, hematochezia GU: Denies penile discharge, ED, urinary flow / outflow problems. No STD concerns. Musculoskeletal: Denies back pain, joint pain Derm: Denies rash, itching Neuro: Denies  paresthesias, frequent falls, frequent headaches Psych: Denies depression, anxiety Endocrine: Denies cold intolerance, heat intolerance, polydipsia Heme: Denies enlarged lymph nodes Allergy: No hayfever  Objective:   BP 130/74   Pulse 97   Temp 98.9 F (37.2 C) (Oral)   Ht 5' 5.5" (1.664 m)   Wt 255 lb 8 oz (115.9 kg)   BMI 41.87 kg/m  Ideal Body Weight: Weight in (lb) to have BMI = 25: 152.2  No exam data present  GEN: well developed, well nourished, no acute distress Eyes: conjunctiva and lids normal, PERRLA, EOMI ENT: TM clear, nares clear, oral exam WNL Neck: supple, no lymphadenopathy, no thyromegaly, no JVD Pulm: clear to auscultation and percussion, respiratory effort normal CV: regular rate and rhythm, S1-S2, no murmur, rub or gallop, no bruits, peripheral pulses normal and symmetric, no cyanosis, clubbing, edema or varicosities GI: soft, non-tender; no hepatosplenomegaly, masses; active bowel sounds all quadrants GU: no hernia, testicular mass, penile discharge Lymph: no cervical, axillary or inguinal adenopathy MSK: gait normal, muscle tone and strength WNL, no joint swelling, effusions, discoloration, crepitus  SKIN: clear,  good turgor, color WNL, no rashes, lesions, or ulcerations Neuro: normal mental status, normal strength, sensation, and motion Psych: alert; oriented to person, place and time, normally interactive and not anxious or depressed in appearance. All labs reviewed with patient.  Lipids:    Component Value Date/Time   CHOL 164 07/04/2018 1026   TRIG 51.0 07/04/2018 1026   HDL 47.00 07/04/2018 1026   LDLDIRECT 144.3 03/12/2014 1619   VLDL 10.2 07/04/2018 1026   CHOLHDL 3 07/04/2018 1026   CBC: CBC Latest Ref Rng & Units 07/04/2018 06/28/2017 10/23/2015  WBC 4.0 - 10.5 K/uL 8.2 7.7 7.6  Hemoglobin 13.0 - 17.0 g/dL 14.3 15.5 14.7  Hematocrit 39.0 - 52.0 % 43.1 47.3 46.5  Platelets 150.0 - 400.0 K/uL 311.0 286.0 355.7    Basic Metabolic Panel:    Component Value Date/Time   NA 139 07/04/2018 1026   NA 137 10/29/2012 0146   K 4.4 07/04/2018 1026   K 3.4 (L) 10/29/2012 0146   CL 101  07/04/2018 1026   CL 102 10/29/2012 0146   CO2 29 07/04/2018 1026   CO2 26 10/29/2012 0146   BUN 12 07/04/2018 1026   BUN 13 10/29/2012 0146   CREATININE 0.58 07/04/2018 1026   CREATININE 0.91 10/29/2012 0146   GLUCOSE 176 (H) 07/04/2018 1026   GLUCOSE 203 (H) 10/29/2012 0146   CALCIUM 9.3 07/04/2018 1026   CALCIUM 7.9 (L) 10/29/2012 0146   Hepatic Function Latest Ref Rng & Units 07/04/2018 06/28/2017 10/23/2015  Total Protein 6.0 - 8.3 g/dL 6.7 6.7 6.9  Albumin 3.5 - 5.2 g/dL 4.4 4.6 4.2  AST 0 - 37 U/L 10 10 16   ALT 0 - 53 U/L 19 15 24   Alk Phosphatase 39 - 117 U/L 93 104 97  Total Bilirubin 0.2 - 1.2 mg/dL 0.4 0.4 0.4  Bilirubin, Direct 0.0 - 0.3 mg/dL 0.1 0.1 0.1    Lab Results  Component Value Date   TSH 0.74 07/28/2010   Lab Results  Component Value Date   PSA 1.01 07/04/2018   PSA 1.06 06/28/2017   PSA 0.93 10/28/2015    Assessment and Plan:   Healthcare maintenance  a1c up to 8, recheck in 3 mo revatio script  Health Maintenance Exam: The patient's preventative maintenance  and recommended screening tests for an annual wellness exam were reviewed in full today. Brought up to date unless services declined.  Counselled on the importance of diet, exercise, and its role in overall health and mortality. The patient's FH and SH was reviewed, including their home life, tobacco status, and drug and alcohol status.  Follow-up in 1 year for physical exam or additional follow-up below.  Follow-up: No follow-ups on file. Or follow-up in 1 year if not noted.  Signed,  Maud Deed. Lorane Cousar, MD   Allergies as of 07/11/2018   No Known Allergies     Medication List        Accurate as of 07/11/18  9:45 AM. Always use your most recent med list.          amLODipine 5 MG tablet Commonly known as:  NORVASC TAKE 1 TABLET BY MOUTH ONCE DAILY   Dulaglutide 1.5 MG/0.5ML Sopn Commonly known as:  TRULICITY 1 [place] injection q week   Insulin Pen Needle 30G X 8 MM Misc Commonly known as:  NOVOFINE Use to inject Trulicity once weekly   metFORMIN 1000 MG tablet Commonly known as:  GLUCOPHAGE TAKE 1 TABLET BY MOUTH TWICE DAILY WITH MEALS   pravastatin 10 MG tablet Commonly known as:  PRAVACHOL Take 1 tablet (10 mg total) by mouth daily.   ranitidine 150 MG tablet Commonly known as:  ZANTAC TAKE 1 TABLET BY MOUTH TWICE DAILY

## 2018-07-12 ENCOUNTER — Telehealth: Payer: Self-pay | Admitting: Family Medicine

## 2018-07-12 ENCOUNTER — Other Ambulatory Visit: Payer: Self-pay | Admitting: *Deleted

## 2018-07-12 MED ORDER — DULAGLUTIDE 1.5 MG/0.5ML ~~LOC~~ SOAJ: SUBCUTANEOUS | 3 refills | Status: DC

## 2018-07-12 MED ORDER — METFORMIN HCL 1000 MG PO TABS: 1000.0000 mg | ORAL_TABLET | Freq: Two times a day (BID) | ORAL | 0 refills | Status: DC

## 2018-07-12 MED ORDER — RANITIDINE HCL 150 MG PO TABS: 150.0000 mg | ORAL_TABLET | Freq: Two times a day (BID) | ORAL | 0 refills | Status: DC

## 2018-07-12 MED ORDER — AMLODIPINE BESYLATE 5 MG PO TABS: 5.0000 mg | ORAL_TABLET | Freq: Every day | ORAL | 0 refills | Status: DC

## 2018-07-12 NOTE — Telephone Encounter (Signed)
Copied from [place] [id number]. Topic: Quick Communication - Rx Refill/Question >> Jul 12, 2018 11:54 AM Scherrie Gerlach wrote: Medication: Dulaglutide (TRULICITY) 1.5 UZ/9.9UF SOPN metFORMIN (GLUCOPHAGE) 1000 MG tablet amLODipine (NORVASC) 5 MG tablet ranitidine (ZANTAC) 150 MG tablet Has the patient contacted their pharmacy? Yes Pt seen yesterday and thought his meds were to be called in.  All 90 day  [place], [place] (Phone) [phone] (Fax)

## 2018-07-13 ENCOUNTER — Encounter: Payer: Self-pay | Admitting: Family Medicine

## 2018-09-25 ENCOUNTER — Other Ambulatory Visit: Payer: Self-pay | Admitting: Family Medicine

## 2018-09-26 ENCOUNTER — Telehealth: Payer: Self-pay | Admitting: *Deleted

## 2018-09-26 NOTE — Telephone Encounter (Signed)
Received fax from [facility] requesting PA for Trulicity.  PA completed on CoverMyMeds.  Sent for review.  Can take up to 72 hours for response.

## 2018-09-27 NOTE — Telephone Encounter (Signed)
PA not required,  Trulicity is on list of covered drugs.  [facility] notified via fax.  I also advised them to make sure they are filing it as a once a week injection.

## 2018-10-10 ENCOUNTER — Ambulatory Visit: Payer: Self-pay | Admitting: Family Medicine

## 2018-10-29 ENCOUNTER — Telehealth: Payer: Self-pay | Admitting: Family Medicine

## 2018-10-29 NOTE — Telephone Encounter (Signed)
Pt stated the pharmacy called and informed him that his ranitidine 150 mg has been pulled from the shelf. Please advise pt what he can take in place of it.

## 2018-10-30 MED ORDER — OMEPRAZOLE 40 MG PO CPDR: 40.0000 mg | DELAYED_RELEASE_CAPSULE | Freq: Every day | ORAL | 0 refills | Status: DC

## 2018-10-30 NOTE — Telephone Encounter (Signed)
If he has daily GERD off ranitidine.. Can start prilosec OTC 2 tabs of 20 mg daily.  If occasional GERD.. Can use Tums  prn.

## 2018-10-30 NOTE — Telephone Encounter (Signed)
Prescription sent as instructed by Dr. Diona Browner.   Patient requested 90 day supply so I sent #90 with no refills.

## 2018-10-30 NOTE — Telephone Encounter (Signed)
Yes, please send in prescription for prilosec 40 mg daily #30, 3RF

## 2018-10-30 NOTE — Telephone Encounter (Signed)
Received fax from [facility] stating Ranitidine 150 mg is on recall and are requesting a new prescription for a different medication.  Please advise.

## 2018-10-30 NOTE — Telephone Encounter (Signed)
Eric Frederick notified as instructed by telephone. He states he has issues everyday with GERD and does not want to go without medication.  He is asking if we can send in Rx for Prilosec 40 mg daily so his insurance will cover.   Ok to do?

## 2018-11-06 ENCOUNTER — Telehealth: Payer: Self-pay | Admitting: Family Medicine

## 2018-11-06 NOTE — Telephone Encounter (Signed)
I spoke with pt to r/s 11/14/18 and he has a question about omeprazole. He said this causes him to urinate frequently and he is wanting to see if there is anything he can take without this side effect. He will call in January to r/s follow up appt with Dr. Lorelei Pont- I offered sooner with Dr. Diona Browner.

## 2018-11-06 NOTE — Telephone Encounter (Signed)
Urinary frequency is not a side effect of omeprazole.. I doubt it is the medication causing this. Have him make an appt to be seen if having any dysuria or fever. Otherwise he can discuss with PCP at next OV.

## 2018-11-07 NOTE — Telephone Encounter (Signed)
Spoke with Mr. Storlie.  He is wanting Dr. Lorelei Pont to prescribe Nexium 40 mg for him to try.  He is willing to wait until Dr. Lorelei Pont is back in the office next week.

## 2018-11-07 NOTE — Telephone Encounter (Signed)
Left message for Mr. Buenger that urinary frequency is not a side effect of omeprazole..Dr. Diona Browner doubts it is the medication causing this. I advised him that he will need to  make an appt to be seen if having any dysuria or fever. Otherwise he can discuss with PCP at next OV.

## 2018-11-07 NOTE — Telephone Encounter (Signed)
Pt returning your call. Please call pt. °

## 2018-11-09 NOTE — Telephone Encounter (Signed)
Urine could be his dm if sugar is up.   Ok to d/c omeprazole  nexium 40 mg, 1 po 30 min before breakfast  Electronically prescribe to the pharmacy of their choice. (May call in if pharmacy does not participate in electronic prescriptions) Call in #30, 11 refills. OR if they prefer a 90 day supply, #90 with 3 refills is OK, too Prescription instructions above

## 2018-11-12 MED ORDER — ESOMEPRAZOLE MAGNESIUM 40 MG PO CPDR: 40.0000 mg | DELAYED_RELEASE_CAPSULE | Freq: Every day | ORAL | 3 refills | Status: DC

## 2018-11-12 NOTE — Addendum Note (Signed)
Addended by: Kenton Kitten on: 11/12/2018 10:38 AM   Modules accepted: Orders

## 2018-11-12 NOTE — Telephone Encounter (Signed)
Mr. Melott notified as instructed by telephone.  Nexium sent to [facility] on [address] as instructed by Dr. Lorelei Pont.

## 2018-11-14 ENCOUNTER — Ambulatory Visit: Payer: Self-pay | Admitting: Family Medicine

## 2018-12-09 ENCOUNTER — Other Ambulatory Visit: Payer: Self-pay | Admitting: Family Medicine

## 2018-12-13 ENCOUNTER — Telehealth: Payer: Self-pay | Admitting: Family Medicine

## 2018-12-13 NOTE — Telephone Encounter (Signed)
Spoke with pt he stated he was driving and would have to call back to schedule appointment   Please schedule office visit with Dr. Lorelei Pont for follow up Diabetes.   Thanks  Butch Penny

## 2018-12-20 NOTE — Telephone Encounter (Signed)
Spoke with pt he stated he was at work and driving and could not make appointment.  He stated to let you know he will call back to schedule.

## 2019-01-09 DIAGNOSIS — M25511 Pain in right shoulder: Secondary | ICD-10-CM | POA: Diagnosis not present

## 2019-01-11 ENCOUNTER — Other Ambulatory Visit: Payer: Self-pay | Admitting: Orthopedic Surgery

## 2019-01-11 DIAGNOSIS — M25511 Pain in right shoulder: Secondary | ICD-10-CM

## 2019-01-20 ENCOUNTER — Ambulatory Visit
Admission: RE | Admit: 2019-01-20 | Discharge: 2019-01-20 | Disposition: A | Discharge status: Home / Self Care | Payer: 59 | Source: Ambulatory Visit | Attending: Orthopedic Surgery | Admitting: Orthopedic Surgery

## 2019-01-20 DIAGNOSIS — M25511 Pain in right shoulder: Secondary | ICD-10-CM

## 2019-01-24 DIAGNOSIS — M25511 Pain in right shoulder: Secondary | ICD-10-CM | POA: Diagnosis not present

## 2019-01-30 DIAGNOSIS — M75111 Incomplete rotator cuff tear or rupture of right shoulder, not specified as traumatic: Secondary | ICD-10-CM | POA: Diagnosis not present

## 2019-02-06 DIAGNOSIS — M75101 Unspecified rotator cuff tear or rupture of right shoulder, not specified as traumatic: Secondary | ICD-10-CM | POA: Diagnosis not present

## 2019-02-12 DIAGNOSIS — M75101 Unspecified rotator cuff tear or rupture of right shoulder, not specified as traumatic: Secondary | ICD-10-CM | POA: Diagnosis not present

## 2019-02-19 DIAGNOSIS — M75101 Unspecified rotator cuff tear or rupture of right shoulder, not specified as traumatic: Secondary | ICD-10-CM | POA: Diagnosis not present

## 2019-02-26 DIAGNOSIS — M75101 Unspecified rotator cuff tear or rupture of right shoulder, not specified as traumatic: Secondary | ICD-10-CM | POA: Diagnosis not present

## 2019-03-05 DIAGNOSIS — M75101 Unspecified rotator cuff tear or rupture of right shoulder, not specified as traumatic: Secondary | ICD-10-CM | POA: Diagnosis not present

## 2019-03-13 DIAGNOSIS — M75101 Unspecified rotator cuff tear or rupture of right shoulder, not specified as traumatic: Secondary | ICD-10-CM | POA: Diagnosis not present

## 2019-03-18 ENCOUNTER — Telehealth: Payer: Self-pay | Admitting: Family Medicine

## 2019-03-18 MED ORDER — AMLODIPINE BESYLATE 5 MG PO TABS: 5.0000 mg | ORAL_TABLET | Freq: Every day | ORAL | 0 refills | Status: DC

## 2019-03-18 MED ORDER — ESOMEPRAZOLE MAGNESIUM 40 MG PO CPDR: 40.0000 mg | DELAYED_RELEASE_CAPSULE | Freq: Every day | ORAL | 0 refills | Status: DC

## 2019-03-18 MED ORDER — METFORMIN HCL 1000 MG PO TABS: ORAL_TABLET | ORAL | 0 refills | Status: DC

## 2019-03-18 MED ORDER — DULAGLUTIDE 1.5 MG/0.5ML ~~LOC~~ SOAJ: SUBCUTANEOUS | 0 refills | Status: DC

## 2019-03-18 NOTE — Telephone Encounter (Signed)
Pt called needing to get refills on  Amlodipine Metformin trulicity esomeprozole   Pt stated he is completely out of rx  [facility] [address]   [phone]  Best number  Pt schedule doxy.me appointment 4/8

## 2019-03-18 NOTE — Telephone Encounter (Signed)
Refills sent to [facility] [address].

## 2019-03-19 ENCOUNTER — Other Ambulatory Visit: Payer: Self-pay

## 2019-03-19 ENCOUNTER — Other Ambulatory Visit: Payer: Self-pay | Admitting: Family Medicine

## 2019-03-19 ENCOUNTER — Other Ambulatory Visit (INDEPENDENT_AMBULATORY_CARE_PROVIDER_SITE_OTHER): Payer: 59

## 2019-03-19 DIAGNOSIS — E1159 Type 2 diabetes mellitus with other circulatory complications: Secondary | ICD-10-CM

## 2019-03-19 DIAGNOSIS — N521 Erectile dysfunction due to diseases classified elsewhere: Secondary | ICD-10-CM | POA: Diagnosis not present

## 2019-03-19 LAB — POCT GLYCOSYLATED HEMOGLOBIN (HGB A1C): Hemoglobin A1C: 8.3 % — AB (ref 4.0–5.6)

## 2019-03-19 NOTE — Progress Notes (Addendum)
Eric Frederick T. Adan Beal, MD Primary Care and [place] at [facility] [place], [address] Phone: [phone]  FAX: [place] - 53 y.o. male  MRN [id number]  Date of Birth: September 18, 1966  Visit Date: 03/20/2019  PCP: Owens Loffler, MD  Referred by: Owens Loffler, MD  Virtual Visit via Video Note:  I connected with  Lysbeth Galas on 03/20/2019  2:20 PM EDT by a video enabled telemedicine application and verified that I am speaking with the correct person using two identifiers.   Location patient: home phone or cell phone Location provider: work or home office Consent: Verbal consent directly obtained from [employer]. Persons participating in the virtual visit: patient, provider  I discussed the limitations of evaluation and management by telemedicine and the availability of in person appointments. The patient expressed understanding and agreed to proceed.  History of Present Illness:  [place] wanted to discuss medications and refills.    BP Readings from Last 3 Encounters:  07/11/18 130/74  10/04/17 138/80  07/03/17 (!) 150/80   Diabetes Mellitus: Tolerating Medications: yes Compliance with diet: fair, Body mass index is 42.28 kg/m. Exercise: minimal / intermittent - none right now Avg blood sugars at home: not checking Foot problems: none Hypoglycemia: none No nausea, vomitting, blurred vision, polyuria.  Currently on Metformin 0814 mg BID, Trulicity 1.5 mg / 0.5 mL soln  Lab Results  Component Value Date   HGBA1C 8.3 (A) 03/19/2019   HGBA1C 8.0 (H) 07/04/2018   HGBA1C 7.3 (H) 10/04/2017   Lab Results  Component Value Date   MICROALBUR 2.0 (H) 07/04/2018   LDLCALC 106 (H) 07/04/2018   CREATININE 0.58 07/04/2018    Wt Readings from Last 3 Encounters:  03/20/19 258 lb (117 kg)  07/11/18 255 lb 8 oz (115.9 kg)  10/04/17 260 lb 4 oz (118 kg)    He also has additional questions  about erectile dysfunction as well as low testosterone.  He lost his Revatio, and he wanted to know about other potential options.  He is a Jeanette Caprice is also decreased.  Review of Systems as above: See pertinent positives and pertinent negatives per HPI No acute distress verbally  Past Medical History, Surgical History, Social History, Family History, Problem List, Medications, and Allergies have been reviewed and updated if relevant.   Observations/Objective/Exam:  An attempt was made to discern vital signs over the phone and per patient if applicable and possible.   General:    Alert, Oriented, appears well and in no acute distress HEENT:     Atraumatic, conjunctiva clear, no obvious abnormalities on inspection of external nose and ears.  Neck:    Normal movements of the head and neck Pulmonary:     On inspection no signs of respiratory distress, breathing rate appears normal, no obvious gross SOB, gasping or wheezing Cardiovascular:    No obvious cyanosis Musculoskeletal:    Moves all visible extremities without noticeable abnormality Psych / Neurological:     Pleasant and cooperative, no obvious depression or anxiety, speech and thought processing grossly intact  Assessment and Plan:  Type 2 diabetes with circulatory disorder causing erectile dysfunction (HCC) - Plan: Dulaglutide (TRULICITY) 1.5 GY/1.8HU SOPN, DISCONTINUED: glipiZIDE (GLUCOTROL XL) 5 MG 24 hr tablet  Essential hypertension  Erectile dysfunction associated with type 2 diabetes mellitus ([place]) - Plan: sildenafil (VIAGRA) 100 MG tablet  Severe obesity (BMI >= 40) (HCC)  >25 minutes spent in face  to face time with patient, >50% spent in counselling or coordination of care   His A1c is at 8.3, I would add some glipizide extended release and encouraged him to lose weight and exercise more.  Additional time spent discussing erectile dysfunction as well as testosterone deficiency.  Discussed increased risk of erectile  dysfunction with diabetes and high blood pressure.  Obesity risk.  Explained testosterone levels in their decline with age and true hypergonadism.  I discussed the assessment and treatment plan with the patient. The patient was provided an opportunity to ask questions and all were answered. The patient agreed with the plan and demonstrated an understanding of the instructions.   The patient was advised to call back or seek an in-person evaluation if the symptoms worsen or if the condition fails to improve as anticipated.  Follow-up: prn unless noted otherwise below Return in about 4 months (around 07/20/2019).  Meds ordered this encounter  Medications  . Dulaglutide (TRULICITY) 1.5 KH/9.9HF SOPN    Sig: INJECT ONE SYRINGEFUL SQ ONCE WEEKLY    Dispense:  12 pen    Refill:  3    3 month supply with 3 refills  . sildenafil (VIAGRA) 100 MG tablet    Sig: Take 1 tablet (100 mg total) by mouth daily as needed for erectile dysfunction (30 mins before intercourse).    Dispense:  10 tablet    Refill:  11  . DISCONTD: glipiZIDE (GLUCOTROL XL) 5 MG 24 hr tablet    Sig: Take 1 tablet (5 mg total) by mouth daily with breakfast. (only once per day)    Dispense:  90 tablet    Refill:  3  . pioglitazone (ACTOS) 15 MG tablet    Sig: Take 1 tablet (15 mg total) by mouth daily.    Dispense:  90 tablet    Refill:  3   No orders of the defined types were placed in this encounter.   Signed,  Maud Deed. Pasha Broad, MD

## 2019-03-20 ENCOUNTER — Ambulatory Visit (INDEPENDENT_AMBULATORY_CARE_PROVIDER_SITE_OTHER): Payer: 59 | Admitting: Family Medicine

## 2019-03-20 ENCOUNTER — Encounter: Payer: Self-pay | Admitting: Family Medicine

## 2019-03-20 VITALS — Ht 65.5 in | Wt 258.0 lb

## 2019-03-20 DIAGNOSIS — E1159 Type 2 diabetes mellitus with other circulatory complications: Secondary | ICD-10-CM | POA: Diagnosis not present

## 2019-03-20 DIAGNOSIS — E1169 Type 2 diabetes mellitus with other specified complication: Secondary | ICD-10-CM | POA: Diagnosis not present

## 2019-03-20 DIAGNOSIS — N521 Erectile dysfunction due to diseases classified elsewhere: Secondary | ICD-10-CM

## 2019-03-20 DIAGNOSIS — I10 Essential (primary) hypertension: Secondary | ICD-10-CM | POA: Diagnosis not present

## 2019-03-20 MED ORDER — GLIPIZIDE ER 5 MG PO TB24: 5.0000 mg | ORAL_TABLET | Freq: Every day | ORAL | 3 refills | Status: DC

## 2019-03-20 MED ORDER — PIOGLITAZONE HCL 15 MG PO TABS: 15.0000 mg | ORAL_TABLET | Freq: Every day | ORAL | 3 refills | Status: DC

## 2019-03-20 MED ORDER — DULAGLUTIDE 1.5 MG/0.5ML ~~LOC~~ SOAJ: SUBCUTANEOUS | 3 refills | Status: DC

## 2019-03-20 MED ORDER — SILDENAFIL CITRATE 100 MG PO TABS: 100.0000 mg | ORAL_TABLET | Freq: Every day | ORAL | 11 refills | Status: DC | PRN

## 2019-03-20 NOTE — Addendum Note (Signed)
Addended by: Owens Loffler on: 03/20/2019 03:06 PM   Modules accepted: Orders

## 2019-04-14 ENCOUNTER — Other Ambulatory Visit: Payer: Self-pay | Admitting: Family Medicine

## 2019-04-18 ENCOUNTER — Other Ambulatory Visit: Payer: Self-pay | Admitting: Family Medicine

## 2019-04-28 ENCOUNTER — Other Ambulatory Visit: Payer: Self-pay | Admitting: Family Medicine

## 2019-05-11 ENCOUNTER — Other Ambulatory Visit: Payer: Self-pay | Admitting: Family Medicine

## 2019-06-08 ENCOUNTER — Other Ambulatory Visit: Payer: Self-pay | Admitting: Family Medicine

## 2019-06-13 ENCOUNTER — Other Ambulatory Visit: Payer: Self-pay | Admitting: Family Medicine

## 2019-06-13 NOTE — Telephone Encounter (Signed)
Left message for Eric Frederick to return my call.  I am showing that we changed him to Nexium 40 mg back in 10/2018.  I need him to clarify what he is currently taking.  Nexium or Omeprazole.

## 2019-06-13 NOTE — Telephone Encounter (Signed)
Patient called to make sure you received the refill request for Omeprazole.  Patient said it's for 90 days with 3 refills.

## 2019-06-13 NOTE — Telephone Encounter (Signed)
Spoke with Eric Frederick.  He states he spoke with Dr. Lorelei Pont about getting a Rx for omeprazole at his last office visit.  He requested a 90 day supply with refills.  He states the Nexium is too expensive.   Prescription for Omeprazole 40 mg sent to [facility].  Mediation list updated.

## 2019-06-13 NOTE — Addendum Note (Signed)
Addended by: Colden Kitten on: 06/13/2019 03:42 PM   Modules accepted: Orders

## 2019-09-06 ENCOUNTER — Other Ambulatory Visit: Payer: Self-pay | Admitting: Family Medicine

## 2019-09-09 NOTE — Telephone Encounter (Signed)
Please schedule appointment with Dr. Lorelei Pont to follow up diabetes.

## 2019-09-10 ENCOUNTER — Telehealth: Payer: Self-pay | Admitting: Family Medicine

## 2019-09-10 NOTE — Telephone Encounter (Signed)
Patient called about refill request. And scheduled follow up appointment,  He would like to make sure the metformin is sent for a 90 day supply

## 2019-09-10 NOTE — Telephone Encounter (Signed)
Error

## 2019-09-17 NOTE — Progress Notes (Signed)
Leslieanne Cobarrubias T. Nailyn Dearinger, MD Primary Care and [place] at [facility] [place], [address] Phone: [phone]  FAX: [place] - 53 y.o. male  MRN [id number]  Date of Birth: Apr 26, 1966  Visit Date: 09/18/2019  PCP: Owens Loffler, MD  Referred by: Owens Loffler, MD Chief Complaint  Patient presents with  . Diabetes   Virtual Visit via Video Note:  I connected with  Eric Frederick on 09/18/2019  8:40 AM EDT by a video enabled telemedicine application and verified that I am speaking with the correct person using two identifiers.   Location patient: home computer, tablet, or smartphone Location provider: work or home office Consent: Verbal consent directly obtained from [employer]. Persons participating in the virtual visit: patient, provider  I discussed the limitations of evaluation and management by telemedicine and the availability of in person appointments. The patient expressed understanding and agreed to proceed.  History of Present Illness:  All bloodwork, dm labs Set up lab draw  Eric Frederick is a well-known patient he is here in follow-up primarily on a follow-up for diabetes, which has been poorly controlled.  Unfortunately the patient has not had any lab work.  Diabetes Mellitus: Tolerating Medications: yes Compliance with diet: fair, Body mass index is 42.44 kg/m. Exercise: minimal / intermittent Avg blood sugars at home: not checking Foot problems: none Hypoglycemia: none No nausea, vomitting, blurred vision, polyuria.  Lab Results  Component Value Date   HGBA1C 8.3 (A) 03/19/2019   HGBA1C 8.0 (H) 07/04/2018   HGBA1C 7.3 (H) 10/04/2017   Lab Results  Component Value Date   MICROALBUR 2.0 (H) 07/04/2018   LDLCALC 106 (H) 07/04/2018   CREATININE 0.58 07/04/2018    Wt Readings from Last 3 Encounters:  09/18/19 259 lb (117.5 kg)  03/20/19 258 lb (117 kg)  07/11/18 255 lb 8  oz (115.9 kg)    Lipids: Doing well, stable. Tolerating meds fine with no SE. Panel reviewed with patient.  Lipids:    Component Value Date/Time   CHOL 164 07/04/2018 1026   TRIG 51.0 07/04/2018 1026   HDL 47.00 07/04/2018 1026   LDLDIRECT 144.3 03/12/2014 1619   VLDL 10.2 07/04/2018 1026   CHOLHDL 3 07/04/2018 1026    Lab Results  Component Value Date   ALT 19 07/04/2018   AST 10 07/04/2018   ALKPHOS 93 07/04/2018   BILITOT 0.4 07/04/2018    HTN: Tolerating all medications without side effects Stable and at goal No CP, no sob. No HA.  BP Readings from Last 3 Encounters:  07/11/18 130/74  10/04/17 138/80  07/03/17 (!) 150/80   He is still having ed despite max dose of viagra  Review of Systems as above: See pertinent positives and pertinent negatives per HPI No acute distress verbally  Past Medical History, Surgical History, Social History, Family History, Problem List, Medications, and Allergies have been reviewed and updated if relevant.   Observations/Objective/Exam:  An attempt was made to discern vital signs over the phone and per patient if applicable and possible.   General:    Alert, Oriented, appears well and in no acute distress HEENT:     Atraumatic, conjunctiva clear, no obvious abnormalities on inspection of external nose and ears.  Neck:    Normal movements of the head and neck Pulmonary:     On inspection no signs of respiratory distress, breathing rate appears normal, no obvious gross SOB, gasping  or wheezing Cardiovascular:    No obvious cyanosis Musculoskeletal:    Moves all visible extremities without noticeable abnormality Psych / Neurological:     Pleasant and cooperative, no obvious depression or anxiety, speech and thought processing grossly intact  Assessment and Plan:    ICD-10-CM   1. Type 2 diabetes with circulatory disorder causing erectile dysfunction (HCC)  E11.59    N52.1   2. Severe obesity (BMI >= 40) (HCC)  E66.01   3.  Essential hypertension  I10   4. HYPERCHOLESTEROLEMIA  E78.00    I told Eric Frederick that I really had no idea what to do with his diabetes and other health problems given that I have had no labs.  [place] refill his current medication and switch him to Levitra rather than Viagra.  He is going to follow-up with a lab appointment sometime in the next 2 weeks.  I discussed the assessment and treatment plan with the patient. The patient was provided an opportunity to ask questions and all were answered. The patient agreed with the plan and demonstrated an understanding of the instructions.   The patient was advised to call back or seek an in-person evaluation if the symptoms worsen or if the condition fails to improve as anticipated.  Follow-up: prn unless noted otherwise below No follow-ups on file.  Meds ordered this encounter  Medications  . metFORMIN (GLUCOPHAGE) 1000 MG tablet    Sig: TAKE 1 TABLET BY MOUTH TWICE DAILY.    Dispense:  180 tablet    Refill:  3  . amLODipine (NORVASC) 5 MG tablet    Sig: TAKE 1 TABLET BY MOUTH ONCE DAILY.    Dispense:  90 tablet    Refill:  3  . vardenafil (LEVITRA) 20 MG tablet    Sig: Take 1 tablet (20 mg total) by mouth daily as needed for erectile dysfunction.    Dispense:  10 tablet    Refill:  5   No orders of the defined types were placed in this encounter.   Signed,  Maud Deed. Jarissa Sheriff, MD

## 2019-09-18 ENCOUNTER — Ambulatory Visit (INDEPENDENT_AMBULATORY_CARE_PROVIDER_SITE_OTHER): Payer: 59 | Admitting: Family Medicine

## 2019-09-18 ENCOUNTER — Encounter: Payer: Self-pay | Admitting: Family Medicine

## 2019-09-18 ENCOUNTER — Other Ambulatory Visit (INDEPENDENT_AMBULATORY_CARE_PROVIDER_SITE_OTHER): Payer: 59

## 2019-09-18 ENCOUNTER — Other Ambulatory Visit: Payer: Self-pay

## 2019-09-18 ENCOUNTER — Telehealth: Payer: Self-pay | Admitting: Radiology

## 2019-09-18 VITALS — Ht 65.5 in | Wt 259.0 lb

## 2019-09-18 DIAGNOSIS — Z125 Encounter for screening for malignant neoplasm of prostate: Secondary | ICD-10-CM

## 2019-09-18 DIAGNOSIS — I10 Essential (primary) hypertension: Secondary | ICD-10-CM | POA: Diagnosis not present

## 2019-09-18 DIAGNOSIS — E7800 Pure hypercholesterolemia, unspecified: Secondary | ICD-10-CM | POA: Diagnosis not present

## 2019-09-18 DIAGNOSIS — E1159 Type 2 diabetes mellitus with other circulatory complications: Secondary | ICD-10-CM | POA: Diagnosis not present

## 2019-09-18 DIAGNOSIS — N521 Erectile dysfunction due to diseases classified elsewhere: Secondary | ICD-10-CM

## 2019-09-18 LAB — CBC WITH DIFFERENTIAL/PLATELET
Basophils Absolute: 0 10*3/uL (ref 0.0–0.1)
Basophils Relative: 0.3 % (ref 0.0–3.0)
Eosinophils Absolute: 0.4 10*3/uL (ref 0.0–0.7)
Eosinophils Relative: 6.3 % — ABNORMAL HIGH (ref 0.0–5.0)
HCT: 44.7 % (ref 39.0–52.0)
Hemoglobin: 14.6 g/dL (ref 13.0–17.0)
Lymphocytes Relative: 32.6 % (ref 12.0–46.0)
Lymphs Abs: 2.1 10*3/uL (ref 0.7–4.0)
MCHC: 32.7 g/dL (ref 30.0–36.0)
MCV: 90.6 fl (ref 78.0–100.0)
Monocytes Absolute: 0.6 10*3/uL (ref 0.1–1.0)
Monocytes Relative: 9.6 % (ref 3.0–12.0)
Neutro Abs: 3.3 10*3/uL (ref 1.4–7.7)
Neutrophils Relative %: 51.2 % (ref 43.0–77.0)
Platelets: 289 10*3/uL (ref 150.0–400.0)
RBC: 4.93 Mil/uL (ref 4.22–5.81)
RDW: 13.1 % (ref 11.5–15.5)
WBC: 6.4 10*3/uL (ref 4.0–10.5)

## 2019-09-18 LAB — HEPATIC FUNCTION PANEL
ALT: 19 U/L (ref 0–53)
AST: 10 U/L (ref 0–37)
Albumin: 4.4 g/dL (ref 3.5–5.2)
Alkaline Phosphatase: 83 U/L (ref 39–117)
Bilirubin, Direct: 0.1 mg/dL (ref 0.0–0.3)
Total Bilirubin: 0.4 mg/dL (ref 0.2–1.2)
Total Protein: 6.6 g/dL (ref 6.0–8.3)

## 2019-09-18 LAB — LIPID PANEL
Cholesterol: 176 mg/dL (ref 0–200)
HDL: 42.5 mg/dL (ref >39.00)
LDL Cholesterol: 123 mg/dL — ABNORMAL HIGH (ref 0–99)
NonHDL: 133.98
Total CHOL/HDL Ratio: 4
Triglycerides: 57 mg/dL (ref 0.0–149.0)
VLDL: 11.4 mg/dL (ref 0.0–40.0)

## 2019-09-18 LAB — BASIC METABOLIC PANEL
BUN: 12 mg/dL (ref 6–23)
CO2: 29 mEq/L (ref 19–32)
Calcium: 9.5 mg/dL (ref 8.4–10.5)
Chloride: 104 mEq/L (ref 96–112)
Creatinine, Ser: 0.64 mg/dL (ref 0.40–1.50)
GFR: 158.03 mL/min (ref >60.00)
Glucose, Bld: 157 mg/dL — ABNORMAL HIGH (ref 70–99)
Potassium: 4.9 mEq/L (ref 3.5–5.1)
Sodium: 141 mEq/L (ref 135–145)

## 2019-09-18 LAB — MICROALBUMIN / CREATININE URINE RATIO
Creatinine,U: 138.9 mg/dL
Microalb Creat Ratio: 1.1 mg/g (ref 0.0–30.0)
Microalb, Ur: 1.5 mg/dL (ref 0.0–1.9)

## 2019-09-18 LAB — HEMOGLOBIN A1C: Hgb A1c MFr Bld: 7.2 % — ABNORMAL HIGH (ref 4.6–6.5)

## 2019-09-18 MED ORDER — AMLODIPINE BESYLATE 5 MG PO TABS: ORAL_TABLET | ORAL | 3 refills | Status: DC

## 2019-09-18 MED ORDER — METFORMIN HCL 1000 MG PO TABS: ORAL_TABLET | ORAL | 3 refills | Status: DC

## 2019-09-18 MED ORDER — VARDENAFIL HCL 20 MG PO TABS: 20.0000 mg | ORAL_TABLET | Freq: Every day | ORAL | 5 refills | Status: DC | PRN

## 2019-09-18 NOTE — Telephone Encounter (Signed)
Patient needs to schedule a lab appt, lvm

## 2019-09-19 ENCOUNTER — Encounter: Payer: Self-pay | Admitting: *Deleted

## 2019-09-19 LAB — PSA, TOTAL WITH REFLEX TO PSA, FREE: PSA, Total: 1 ng/mL (ref <=4.0)

## 2019-09-26 ENCOUNTER — Ambulatory Visit (INDEPENDENT_AMBULATORY_CARE_PROVIDER_SITE_OTHER): Payer: 59

## 2019-09-26 DIAGNOSIS — Z23 Encounter for immunization: Secondary | ICD-10-CM | POA: Diagnosis not present

## 2019-12-03 ENCOUNTER — Telehealth: Payer: Self-pay | Admitting: Family Medicine

## 2019-12-03 NOTE — Telephone Encounter (Signed)
Metformin was refilled on 09/18/2019 for #180 with 3 refills.  He has refills available at [facility].  Eric Frederick notified by telephone.

## 2019-12-03 NOTE — Telephone Encounter (Signed)
Patient is requesting a refill on his  METFORMIN He stated he wanted to make sure it was a 90 day supply with future refills  [place]

## 2020-02-23 ENCOUNTER — Other Ambulatory Visit: Payer: Self-pay | Admitting: Family Medicine

## 2020-03-23 ENCOUNTER — Telehealth: Payer: Self-pay | Admitting: Family Medicine

## 2020-03-23 DIAGNOSIS — E1159 Type 2 diabetes mellitus with other circulatory complications: Secondary | ICD-10-CM

## 2020-03-23 NOTE — Telephone Encounter (Signed)
Please try and schedule in office CPE with fasting labs prior with Dr. Lorelei Pont.

## 2020-03-24 NOTE — Telephone Encounter (Signed)
Patient scheduled 4/21 labs 4/28 cpe

## 2020-04-01 ENCOUNTER — Other Ambulatory Visit (INDEPENDENT_AMBULATORY_CARE_PROVIDER_SITE_OTHER): Payer: 59

## 2020-04-01 DIAGNOSIS — E1169 Type 2 diabetes mellitus with other specified complication: Secondary | ICD-10-CM

## 2020-04-01 DIAGNOSIS — Z79899 Other long term (current) drug therapy: Secondary | ICD-10-CM

## 2020-04-01 DIAGNOSIS — E785 Hyperlipidemia, unspecified: Secondary | ICD-10-CM | POA: Diagnosis not present

## 2020-04-01 LAB — BASIC METABOLIC PANEL
BUN: 17 mg/dL (ref 6–23)
CO2: 29 mEq/L (ref 19–32)
Calcium: 9.3 mg/dL (ref 8.4–10.5)
Chloride: 102 mEq/L (ref 96–112)
Creatinine, Ser: 0.65 mg/dL (ref 0.40–1.50)
GFR: 154.91 mL/min (ref >60.00)
Glucose, Bld: 196 mg/dL — ABNORMAL HIGH (ref 70–99)
Potassium: 4.5 mEq/L (ref 3.5–5.1)
Sodium: 139 mEq/L (ref 135–145)

## 2020-04-01 LAB — HEMOGLOBIN A1C: Hgb A1c MFr Bld: 6.8 % — ABNORMAL HIGH (ref 4.6–6.5)

## 2020-04-01 LAB — HEPATIC FUNCTION PANEL
ALT: 21 U/L (ref 0–53)
AST: 13 U/L (ref 0–37)
Albumin: 4.2 g/dL (ref 3.5–5.2)
Alkaline Phosphatase: 93 U/L (ref 39–117)
Bilirubin, Direct: 0.1 mg/dL (ref 0.0–0.3)
Total Bilirubin: 0.4 mg/dL (ref 0.2–1.2)
Total Protein: 6.4 g/dL (ref 6.0–8.3)

## 2020-04-01 LAB — LIPID PANEL
Cholesterol: 168 mg/dL (ref 0–200)
HDL: 48.3 mg/dL (ref >39.00)
LDL Cholesterol: 110 mg/dL — ABNORMAL HIGH (ref 0–99)
NonHDL: 119.96
Total CHOL/HDL Ratio: 3
Triglycerides: 52 mg/dL (ref 0.0–149.0)
VLDL: 10.4 mg/dL (ref 0.0–40.0)

## 2020-04-01 LAB — MICROALBUMIN / CREATININE URINE RATIO
Creatinine,U: 102.5 mg/dL
Microalb Creat Ratio: 1.6 mg/g (ref 0.0–30.0)
Microalb, Ur: 1.7 mg/dL (ref 0.0–1.9)

## 2020-04-01 NOTE — Addendum Note (Signed)
Addended by: Cloyd Stagers on: 04/01/2020 09:05 AM   Modules accepted: Orders

## 2020-04-07 NOTE — Progress Notes (Signed)
Sayla Golonka T. Zavier Canela, MD, [place] at [facility] [place], [address]  Phone: [phone]  FAX: [place] - 54 y.o. male  MRN [id number]  Date of Birth: 1966-06-13  Date: 04/08/2020  PCP: Owens Loffler, MD  Referral: Owens Loffler, MD  Chief Complaint  Patient presents with  . Annual Exam    This visit occurred during the SARS-CoV-2 public health emergency.  Safety protocols were in place, including screening questions prior to the visit, additional usage of staff PPE, and extensive cleaning of exam room while observing appropriate contact time as indicated for disinfecting solutions.   Patient Care Team: Owens Loffler, MD as PCP - General Subjective:   Eric Frederick is a 54 y.o. pleasant patient who presents with the following:  Preventative Health Maintenance Visit and following up on several chronic medical conditions.  Health Maintenance Summary Reviewed and updated, unless pt declines services.  Tobacco History Reviewed. Alcohol: No concerns, no excessive use Exercise Habits: Some activity, rec at least 30 mins 5 times a week - moving a lot STD concerns: no risk or activity to increase risk Drug Use: None  Diabetes Mellitus: Tolerating Medications: yes Compliance with diet: fair, Body mass index is 45.31 kg/m. Exercise: minimal / intermittent Avg blood sugars at home: not checking Foot problems: none Hypoglycemia: none No nausea, vomitting, blurred vision, polyuria.  Handicapped sticker.   Lab Results  Component Value Date   HGBA1C 6.8 (H) 04/01/2020   HGBA1C 7.2 (H) 09/18/2019   HGBA1C 8.3 (A) 03/19/2019   Lab Results  Component Value Date   MICROALBUR 1.7 04/01/2020   LDLCALC 110 (H) 04/01/2020   CREATININE 0.65 04/01/2020    Wt Readings from Last 3 Encounters:  04/08/20 276 lb 8 oz (125.4 kg)  09/18/19 259 lb (117.5 kg)    03/20/19 258 lb (117 kg)    Aside from his general health care, he is having quite a bit of difficulty with both knees.  Pain and crepitus much of the time.  He asked about some different treatment options.  Health Maintenance  Topic Date Due  . HIV Screening  Never done  . FOOT EXAM  10/27/2016  . OPHTHALMOLOGY EXAM  02/28/2019  . INFLUENZA VACCINE  07/12/2020  . HEMOGLOBIN A1C  10/01/2020  . COLONOSCOPY  06/29/2021  . TETANUS/TDAP  03/14/2023  . PNEUMOCOCCAL POLYSACCHARIDE VACCINE AGE 33-64 HIGH RISK  Completed  . COVID-19 Vaccine  Completed   Immunization History  Administered Date(s) Administered  . Influenza Split 09/12/2012  . Influenza,inj,Quad PF,6+ Mos 10/22/2014, 10/28/2015, 10/04/2017, 09/26/2019  . PFIZER SARS-COV-2 Vaccination 02/25/2020, 03/17/2020  . Pneumococcal Conjugate-13 10/22/2014  . Pneumococcal Polysaccharide-23 03/13/2013  . Tdap 03/13/2013   Diabetes Mellitus: Tolerating Medications: yes Compliance with diet: fair, Body mass index is 45.31 kg/m. Exercise: minimal / intermittent Avg blood sugars at home: not checking Foot problems: none Hypoglycemia: none No nausea, vomitting, blurred vision, polyuria.  Lab Results  Component Value Date   HGBA1C 6.8 (H) 04/01/2020   HGBA1C 7.2 (H) 09/18/2019   HGBA1C 8.3 (A) 03/19/2019   Lab Results  Component Value Date   MICROALBUR 1.7 04/01/2020   LDLCALC 110 (H) 04/01/2020   CREATININE 0.65 04/01/2020    Wt Readings from Last 3 Encounters:  04/08/20 276 lb 8 oz (125.4 kg)  09/18/19 259 lb (117.5 kg)  03/20/19 258 lb (117 kg)    HTN: Tolerating  all medications without side effects Stable and at goal No CP, no sob. No HA.  BP Readings from Last 3 Encounters:  04/08/20 (!) 142/80  07/11/18 130/74  10/04/17 [id number]    Basic Metabolic Panel:    Component Value Date/Time   NA 139 04/01/2020 0905   NA 137 10/29/2012 0146   K 4.5 04/01/2020 0905   K 3.4 (L) 10/29/2012 0146   CL 102 04/01/2020  0905   CL 102 10/29/2012 0146   CO2 29 04/01/2020 0905   CO2 26 10/29/2012 0146   BUN 17 04/01/2020 0905   BUN 13 10/29/2012 0146   CREATININE 0.65 04/01/2020 0905   CREATININE 0.91 10/29/2012 0146   GLUCOSE 196 (H) 04/01/2020 0905   GLUCOSE 203 (H) 10/29/2012 0146   CALCIUM 9.3 04/01/2020 0905   CALCIUM 7.9 (L) 10/29/2012 0146    Lipids: Doing well, stable. Tolerating meds fine with no SE. Panel reviewed with patient.  Lipids:    Component Value Date/Time   CHOL 168 04/01/2020 0905   TRIG 52.0 04/01/2020 0905   HDL 48.30 04/01/2020 0905   LDLDIRECT 144.3 03/12/2014 1619   VLDL 10.4 04/01/2020 0905   CHOLHDL 3 04/01/2020 0905    Lab Results  Component Value Date   ALT 21 04/01/2020   AST 13 04/01/2020   ALKPHOS 93 04/01/2020   BILITOT 0.4 04/01/2020     Patient Active Problem List   Diagnosis Date Noted  . Hiatal hernia 04/22/2015  . Severe obesity (BMI >= 40) ([place]) 03/12/2014  . History of noncompliance with medical treatment 03/14/2012  . Erectile dysfunction associated with type 2 diabetes mellitus ([place]) 03/02/2011  . Type 2 diabetes with circulatory disorder causing erectile dysfunction ([place]) 07/21/2010  . HYPERCHOLESTEROLEMIA 07/21/2010  . Essential hypertension 07/21/2010    Past Medical History:  Diagnosis Date  . Diabetes mellitus   . Diverticulosis   . GERD (gastroesophageal reflux disease)   . Hiatal hernia 04/22/2015  . Hyperlipidemia   . Hypertension   . Serrated adenoma of colon 06/2016    Past Surgical History:  Procedure Laterality Date  . SHOULDER ARTHROSCOPY W/ ROTATOR CUFF REPAIR Right 2014   [place]    Family History  Problem Relation Age of Onset  . Other Father        complications from agent orange  . Colon cancer Neg Hx     Past Medical History, Surgical History, Social History, Family History, Problem List, Medications, and Allergies have been reviewed and updated if relevant.  Review of Systems: Pertinent positives are  listed above.  Otherwise, a full 14 point review of systems has been done in full and it is negative except where it is noted positive.  Objective:   BP (!) 142/80   Pulse (!) 105   Temp 98.3 F (36.8 C) (Temporal)   Ht 5' 5.5" (1.664 m)   Wt 276 lb 8 oz (125.4 kg)   SpO2 95%   BMI 45.31 kg/m  Ideal Body Weight: Weight in (lb) to have BMI = 25: 152.2  Ideal Body Weight: Weight in (lb) to have BMI = 25: 152.2 No exam data present Depression screen [facility] 2/9 04/08/2020 07/11/2018 06/29/2017  Decreased Interest 0 0 0  Down, Depressed, Hopeless 0 0 0  PHQ - 2 Score 0 0 0     GEN: well developed, well nourished, no acute distress Eyes: conjunctiva and lids normal, PERRLA, EOMI ENT: TM clear, nares clear, oral exam WNL Neck: supple, no lymphadenopathy, no thyromegaly,  no JVD Pulm: clear to auscultation and percussion, respiratory effort normal CV: regular rate and rhythm, S1-S2, no murmur, rub or gallop, no bruits, peripheral pulses normal and symmetric, no cyanosis, clubbing, edema or varicosities GI: soft, non-tender; no hepatosplenomegaly, masses; active bowel sounds all quadrants GU: no hernia, testicular mass, penile discharge Lymph: no cervical, axillary or inguinal adenopathy MSK: gait normal, muscle tone and strength WNL, no joint swelling, effusions, discoloration, crepitus  SKIN: clear, good turgor, color WNL, no rashes, lesions, or ulcerations Neuro: normal mental status, normal strength, sensation, and motion Psych: alert; oriented to person, place and time, normally interactive and not anxious or depressed in appearance.  All labs reviewed with patient. Results for orders placed or performed in visit on [id number]  Basic metabolic panel  Result Value Ref Range   Sodium 139 135 - 145 mEq/L   Potassium 4.5 3.5 - 5.1 mEq/L   Chloride 102 96 - 112 mEq/L   CO2 29 19 - 32 mEq/L   Glucose, Bld 196 (H) 70 - 99 mg/dL   BUN 17 6 - 23 mg/dL   Creatinine, Ser 0.65 0.40 - 1.50  mg/dL   GFR 154.91 >60.00 mL/min   Calcium 9.3 8.4 - 10.5 mg/dL  Hepatic function panel  Result Value Ref Range   Total Bilirubin 0.4 0.2 - 1.2 mg/dL   Bilirubin, Direct 0.1 0.0 - 0.3 mg/dL   Alkaline Phosphatase 93 39 - 117 U/L   AST 13 0 - 37 U/L   ALT 21 0 - 53 U/L   Total Protein 6.4 6.0 - 8.3 g/dL   Albumin 4.2 3.5 - 5.2 g/dL  Microalbumin / creatinine urine ratio  Result Value Ref Range   Microalb, Ur 1.7 0.0 - 1.9 mg/dL   Creatinine,U 102.5 mg/dL   Microalb Creat Ratio 1.6 0.0 - 30.0 mg/g  Hemoglobin A1c  Result Value Ref Range   Hgb A1c MFr Bld 6.8 (H) 4.6 - 6.5 %  Lipid panel  Result Value Ref Range   Cholesterol 168 0 - 200 mg/dL   Triglycerides 52.0 0.0 - 149.0 mg/dL   HDL 48.30 >39.00 mg/dL   VLDL 10.4 0.0 - 40.0 mg/dL   LDL Cholesterol 110 (H) 0 - 99 mg/dL   Total CHOL/HDL Ratio 3    NonHDL 119.96     Assessment and Plan:     ICD-10-CM   1. Healthcare maintenance  Z00.00   2. Type 2 diabetes with circulatory disorder causing erectile dysfunction (HCC)  E11.59    N52.1   3. Severe obesity (BMI >= 40) (HCC)  E66.01   4. HYPERCHOLESTEROLEMIA  E78.00   5. Essential hypertension  I10    For his bilateral knee pain, I am going to have him come in within the next few weeks to reassess and formulate a plan of care.  Hypercholesterolemia as well as diabetes, the patient stopped his statin and is not interested in resuming this.  Otherwise is stable and he is losing weight.  Health Maintenance Exam: The patient's preventative maintenance and recommended screening tests for an annual wellness exam were reviewed in full today. Brought up to date unless services declined.  Counselled on the importance of diet, exercise, and its role in overall health and mortality. The patient's FH and SH was reviewed, including their home life, tobacco status, and drug and alcohol status.  Follow-up in 1 year for physical exam or additional follow-up below.  Follow-up: Return  for few weeks for knee arthritis follow-up, probable injections.  Or follow-up in 1 year if not noted.  No orders of the defined types were placed in this encounter.  Medications Discontinued During This Encounter  Medication Reason  . Insulin Pen Needle (NOVOFINE) 30G X 8 MM MISC Completed Course  . pravastatin (PRAVACHOL) 10 MG tablet    No orders of the defined types were placed in this encounter.   Signed,  Maud Deed. Telesa Jeancharles, MD   Allergies as of 04/08/2020      Reactions   Glipizide       Medication List       Accurate as of April 08, 2020 10:21 AM. If you have any questions, ask your nurse or doctor.        STOP taking these medications   Insulin Pen Needle 30G X 8 MM Misc Commonly known as: NOVOFINE Stopped by: Owens Loffler, MD   pravastatin 10 MG tablet Commonly known as: PRAVACHOL Stopped by: Owens Loffler, MD     TAKE these medications   amLODipine 5 MG tablet Commonly known as: NORVASC TAKE 1 TABLET BY MOUTH ONCE DAILY.   metFORMIN 1000 MG tablet Commonly known as: GLUCOPHAGE TAKE 1 TABLET BY MOUTH TWICE DAILY.   omeprazole 40 MG capsule Commonly known as: PRILOSEC Take 1 capsule by mouth once daily   pioglitazone 15 MG tablet Commonly known as: ACTOS Take 1 tablet by mouth once daily   Trulicity 1.5 0000000 Sopn Generic drug: Dulaglutide INJECT ONE SYRINGEFUL SUBCUTANEOUSLY ONCE WEEKLY.   vardenafil 20 MG tablet Commonly known as: Levitra Take 1 tablet (20 mg total) by mouth daily as needed for erectile dysfunction.

## 2020-04-08 ENCOUNTER — Ambulatory Visit (INDEPENDENT_AMBULATORY_CARE_PROVIDER_SITE_OTHER): Payer: 59 | Admitting: Family Medicine

## 2020-04-08 ENCOUNTER — Encounter: Payer: Self-pay | Admitting: Family Medicine

## 2020-04-08 ENCOUNTER — Other Ambulatory Visit: Payer: Self-pay

## 2020-04-08 VITALS — BP 142/80 | HR 105 | Temp 98.3°F | Ht 65.5 in | Wt 276.5 lb

## 2020-04-08 DIAGNOSIS — N521 Erectile dysfunction due to diseases classified elsewhere: Secondary | ICD-10-CM

## 2020-04-08 DIAGNOSIS — E7800 Pure hypercholesterolemia, unspecified: Secondary | ICD-10-CM

## 2020-04-08 DIAGNOSIS — I10 Essential (primary) hypertension: Secondary | ICD-10-CM

## 2020-04-08 DIAGNOSIS — E1159 Type 2 diabetes mellitus with other circulatory complications: Secondary | ICD-10-CM

## 2020-04-08 DIAGNOSIS — Z0000 Encounter for general adult medical examination without abnormal findings: Secondary | ICD-10-CM

## 2020-04-08 NOTE — Patient Instructions (Signed)
Diclofenac 1% gel, can use up to 4 times a day  (Voltaren gel) - costs about 9 dollars for a tube  Alleve or Ibuprofen is ok.

## 2020-04-13 ENCOUNTER — Encounter: Payer: Self-pay | Admitting: Family Medicine

## 2020-04-13 ENCOUNTER — Ambulatory Visit
Admission: RE | Admit: 2020-04-13 | Discharge: 2020-04-13 | Disposition: A | Discharge status: Home / Self Care | Payer: 59 | Source: Ambulatory Visit | Attending: Family Medicine | Admitting: Family Medicine

## 2020-04-13 ENCOUNTER — Ambulatory Visit (INDEPENDENT_AMBULATORY_CARE_PROVIDER_SITE_OTHER)
Admission: RE | Admit: 2020-04-13 | Discharge: 2020-04-13 | Disposition: A | Discharge status: Home / Self Care | Payer: 59 | Source: Ambulatory Visit | Attending: Family Medicine | Admitting: Family Medicine

## 2020-04-13 ENCOUNTER — Other Ambulatory Visit: Payer: Self-pay

## 2020-04-13 ENCOUNTER — Ambulatory Visit: Payer: 59 | Admitting: Family Medicine

## 2020-04-13 VITALS — BP 160/80 | HR 119 | Temp 97.5°F | Ht 65.5 in | Wt 277.2 lb

## 2020-04-13 DIAGNOSIS — M170 Bilateral primary osteoarthritis of knee: Secondary | ICD-10-CM

## 2020-04-13 DIAGNOSIS — N521 Erectile dysfunction due to diseases classified elsewhere: Secondary | ICD-10-CM | POA: Diagnosis not present

## 2020-04-13 DIAGNOSIS — E1169 Type 2 diabetes mellitus with other specified complication: Secondary | ICD-10-CM | POA: Diagnosis not present

## 2020-04-13 MED ORDER — METHYLPREDNISOLONE ACETATE 40 MG/ML IJ SUSP
80.0000 mg | Freq: Once | INTRAMUSCULAR | Status: AC
  Administered 2020-04-13: 80 mg via INTRA_ARTICULAR

## 2020-04-13 NOTE — Patient Instructions (Signed)
OSTEOARTHRITIS:  For symptomatic relief:  Tylenol: 2 tablets up to 3-4 times a day Regular NSAIDS are helpful (avoid in kidney disease and ulcers)  Supplements: Tart cherry juice and Curcumin (Turmeric extract) have good scientific evidence  For flares, corticosteroid injections help. Hyaluronic Acid injections have good success, average relief is 6 months  Ice joints on bad days, 20 min, 2-3 x / day REGULAR EXERCISE: swimming, Yoga, Tai Chi, bicycle (NON-IMPACT activity)   Weight loss will always take stress off of the joints and back

## 2020-04-13 NOTE — Progress Notes (Signed)
Truc Winfree T. Mekhia Brogan, MD, [place] at [facility] [place], [address]  Phone: [phone]  FAX: [place] - 54 y.o. male  MRN [id number]  Date of Birth: May 26, 1966  Date: 04/13/2020  PCP: Owens Loffler, MD  Referral: Owens Loffler, MD  Chief Complaint  Patient presents with  . Knee Pain    Bilateral    This visit occurred during the SARS-CoV-2 public health emergency.  Safety protocols were in place, including screening questions prior to the visit, additional usage of staff PPE, and extensive cleaning of exam room while observing appropriate contact time as indicated for disinfecting solutions.   Subjective:   Eric Frederick is a 54 y.o. very pleasant male patient with Body mass index is 45.44 kg/m. who presents with the following:  Eric Frederick is a very nice patient who I have seen with multiple medical problems over multiple years.  On my last exam and on his physical he was having some significant bilateral knee pain with some intermittent effusions.  This had become quite limiting to him from multiple standpoints including his basic activities of daily living as well as his ability to exercise.  This is compounded by a BMI of 45.  He also mentions that his ED medication, Levitra, is not working.  He is interested in trying Viagra again.  B knee inj  Review of Systems is noted in the HPI, as appropriate   Objective:   BP (!) 160/80   Pulse (!) 119   Temp (!) 97.5 F (36.4 C) (Temporal)   Ht 5' 5.5" (1.664 m)   Wt 277 lb 4 oz (125.8 kg)   SpO2 94%   BMI 45.44 kg/m   GEN: No acute distress; alert,appropriate. PULM: Breathing comfortably in no respiratory distress PSYCH: Normally interactive.    Both knees: Lacks 2 degrees of extension with flexion to 95 degrees.  Mild effusions bilaterally.  Stable to varus and valgus stress with intact ACL and PCL.   Notable pain medial greater than lateral joint lines.  All forced extension and flexion cause some significant amount of pain including flexion pinch and McMurray's.  Radiology: DG Knee 4 Views W/Patella Left  Result Date: 04/13/2020 CLINICAL DATA:  Osteoarthritis EXAM: LEFT KNEE - COMPLETE 4+ VIEW COMPARISON:  None. FINDINGS: Weightbearing frontal, weight-bearing tunnel, weight-bearing lateral, and sunrise patellar images were obtained. There is no fracture or dislocation. No appreciable joint effusion. There is severe narrowing medially and in the patellofemoral joint. There is spurring in all compartments. There is extensive bony overgrowth along the patellofemoral joint. There are spurs along the anterior aspect of the patella superiorly and inferiorly. No erosive change. IMPRESSION: Extensive osteoarthritic change, most marked medially and in the patellofemoral joint regions. No fracture, dislocation, or effusion. Spurs along the anterior patella probably represent distal quadriceps and proximal patellar tendinosis. Electronically Signed   By: Lowella Grip III M.D.   On: 04/13/2020 14:59   DG Knee 4 Views W/Patella Right  Result Date: 04/13/2020 CLINICAL DATA:  Osteoarthritis EXAM: RIGHT KNEE - COMPLETE 4+ VIEW COMPARISON:  None. FINDINGS: Weightbearing frontal, weight-bearing tunnel, weight-bearing lateral, and sunrise patellar images were obtained. No fracture or dislocation. No appreciable joint effusion. There is marked joint space narrowing medially and in the patellofemoral joint regions. There is diffuse bony overgrowth along the posterior patella in the patellofemoral joint region. There are spurs along the anterior aspect of  the patella superiorly and inferiorly. No erosive change. IMPRESSION: Extensive osteoarthritic change, most severe medially and in the patellofemoral joint. No fracture, dislocation, or effusion. Probable distal quadriceps and proximal patellar tendinosis given  anterior foci of patellar spurring. Electronically Signed   By: Lowella Grip III M.D.   On: 04/13/2020 15:00    Assessment and Plan:     ICD-10-CM   1. Severe bilateral knee arthritis  M17.0 DG Knee 4 Views W/Patella Left    DG Knee 4 Views W/Patella Right    methylPREDNISolone acetate (DEPO-MEDROL) injection 80 mg    methylPREDNISolone acetate (DEPO-MEDROL) injection 80 mg  2. Severe obesity (BMI >= 40) (HCC)  E66.01   3. Erectile dysfunction associated with type 2 diabetes mellitus ([place])  E11.69    N52.1    Advanced knee osteoarthritis with bone-on-bone pathology.  I reviewed all of this with him in terms of anatomy.  If steroid injections do not prove successful then a round of viscosupplementation would be entirely reasonable.  I am also going to discontinue his Levitra and do a trial of Viagra.  Aspiration/Injection Procedure Note Eric Frederick 1966/08/14 Date of procedure: 04/13/2020  Procedure: Large Joint Joint Aspiration / Injection of the Right Knee Indications: Pain  Procedure Details Patient verbally consented to procedure. Risks (including potential rare risk of infection), benefits, and alternatives explained. Sterilely prepped with Chloraprep. Ethyl cholride used for anesthesia. 8 cc Lidocaine 1% mixed with 2 mL Depo-Medrol 40 mg injected using the anteromedial approach without difficulty. No complications with procedure and tolerated well. Patient had decreased pain post-injection.  Medication: 2 mL of Depo-Medrol 40 mg, equaling Depo-Medrol 80 mg total  Aspiration/Injection Procedure Note Eric Frederick [birth year] Date of procedure: 04/13/2020  Procedure: Large Joint Aspiration / Injection of the Left Knee Indications: Pain  Procedure Details Patient verbally consented to procedure. Risks (including potential rare risk of infection), benefits, and alternatives explained. Sterilely prepped with Chloraprep. Ethyl cholride used for anesthesia. 8 cc Lidocaine  1% mixed with 2 mL Depo-Medrol 40 mg injected using the anteromedial approach without difficulty. No complications with procedure and tolerated well. Patient had decreased pain post-injection.  Medication: 2 mL of Depo-Medrol 40 mg, equaling Depo-Medrol 80 mg total  Patient Instructions  OSTEOARTHRITIS:  For symptomatic relief:  Tylenol: 2 tablets up to 3-4 times a day Regular NSAIDS are helpful (avoid in kidney disease and ulcers)  Supplements: Tart cherry juice and Curcumin (Turmeric extract) have good scientific evidence  For flares, corticosteroid injections help. Hyaluronic Acid injections have good success, average relief is 6 months  Ice joints on bad days, 20 min, 2-3 x / day REGULAR EXERCISE: swimming, Yoga, Tai Chi, bicycle (NON-IMPACT activity)   Weight loss will always take stress off of the joints and back     Follow-up: If symptoms return or otherwise follow-up for routine health care.  Meds ordered this encounter  Medications  . methylPREDNISolone acetate (DEPO-MEDROL) injection 80 mg  . methylPREDNISolone acetate (DEPO-MEDROL) injection 80 mg  . sildenafil (VIAGRA) 100 MG tablet    Sig: Take 0.5-1 tablets (50-100 mg total) by mouth daily as needed for erectile dysfunction.    Dispense:  5 tablet    Refill:  11   Medications Discontinued During This Encounter  Medication Reason  . vardenafil (LEVITRA) 20 MG tablet    Orders Placed This Encounter  Procedures  . DG Knee 4 Views W/Patella Left  . DG Knee 4 Views W/Patella Right    Signed,  Frederico Hamman  Celedonio Savage, MD   Outpatient Encounter Medications as of 04/13/2020  Medication Sig  . amLODipine (NORVASC) 5 MG tablet TAKE 1 TABLET BY MOUTH ONCE DAILY.  . metFORMIN (GLUCOPHAGE) 1000 MG tablet TAKE 1 TABLET BY MOUTH TWICE DAILY.  Marland Kitchen omeprazole (PRILOSEC) 40 MG capsule Take 1 capsule by mouth once daily  . pioglitazone (ACTOS) 15 MG tablet Take 1 tablet by mouth once daily  . TRULICITY 1.5 RN/1.6FB SOPN INJECT  ONE SYRINGEFUL SUBCUTANEOUSLY ONCE WEEKLY.  . [DISCONTINUED] vardenafil (LEVITRA) 20 MG tablet Take 1 tablet (20 mg total) by mouth daily as needed for erectile dysfunction.  . sildenafil (VIAGRA) 100 MG tablet Take 0.5-1 tablets (50-100 mg total) by mouth daily as needed for erectile dysfunction.  . [EXPIRED] methylPREDNISolone acetate (DEPO-MEDROL) injection 80 mg   . [EXPIRED] methylPREDNISolone acetate (DEPO-MEDROL) injection 80 mg    No facility-administered encounter medications on file as of 04/13/2020.

## 2020-04-14 DIAGNOSIS — M170 Bilateral primary osteoarthritis of knee: Secondary | ICD-10-CM | POA: Insufficient documentation

## 2020-04-14 MED ORDER — SILDENAFIL CITRATE 100 MG PO TABS: 50.0000 mg | ORAL_TABLET | Freq: Every day | ORAL | 11 refills | Status: DC | PRN

## 2020-04-21 ENCOUNTER — Telehealth: Payer: Self-pay

## 2020-04-21 NOTE — Telephone Encounter (Signed)
[facility] would not accept original form with scratched out errors. [facility] sent a new form to be resigned. Please have the doctor circle option 3 and sign. Patient would like to pick up form asap.

## 2020-04-21 NOTE — Telephone Encounter (Signed)
Form completed and placed in Dr. Copland's office in box for signature. 

## 2020-04-22 NOTE — Telephone Encounter (Signed)
done

## 2020-04-22 NOTE — Telephone Encounter (Signed)
Eric Frederick notified Handicap Placard Application is ready to be picked up at the front desk.

## 2020-05-26 ENCOUNTER — Other Ambulatory Visit: Payer: Self-pay | Admitting: *Deleted

## 2020-05-26 MED ORDER — PIOGLITAZONE HCL 15 MG PO TABS: 15.0000 mg | ORAL_TABLET | Freq: Every day | ORAL | 1 refills | Status: DC

## 2020-05-27 ENCOUNTER — Other Ambulatory Visit: Payer: Self-pay | Admitting: Family Medicine

## 2020-06-03 ENCOUNTER — Telehealth: Payer: Self-pay

## 2020-06-03 NOTE — Telephone Encounter (Signed)
Spoke with Eric Frederick.  He is asking when he can get his knees injected again.  He states his last shots has already worn off.  He last injections were on 04/13/2020.  I advised that usually these injections can be given every 3 months but that I would send a message to Dr. Lorelei Pont for recommendations.  He states if he can get them done, he would like to get in ASAP because his knees are really hurting him.  Please advise.

## 2020-06-03 NOTE — Telephone Encounter (Signed)
Patient contacted the office and states he needed to speak with Dr. Lillie Fragmin assistant. I advised she was busy - and that I was a CMA and asked if there was anything I could help him with. He states he only needs to speak to Butch Penny. Will route to her.

## 2020-06-04 NOTE — Telephone Encounter (Signed)
It is too early.  I could inject his knees 3 times a year, but that is it.

## 2020-06-04 NOTE — Telephone Encounter (Signed)
Kenny notified as instructed by telephone.  Appointment scheduled for 07/15/2020 at 8:40 am with Dr. Lorelei Pont for bilateral knee injections.

## 2020-06-20 ENCOUNTER — Other Ambulatory Visit: Payer: Self-pay | Admitting: Family Medicine

## 2020-06-20 DIAGNOSIS — E1159 Type 2 diabetes mellitus with other circulatory complications: Secondary | ICD-10-CM

## 2020-06-20 DIAGNOSIS — N521 Erectile dysfunction due to diseases classified elsewhere: Secondary | ICD-10-CM

## 2020-07-02 IMAGING — DX DG KNEE COMPLETE 4+V*R*
4 series · 4 of 4 positions shown · non-contrast
Comparison: None.

CLINICAL DATA: Osteoarthritis

EXAM:
RIGHT KNEE - COMPLETE 4+ VIEW

[knee ap]
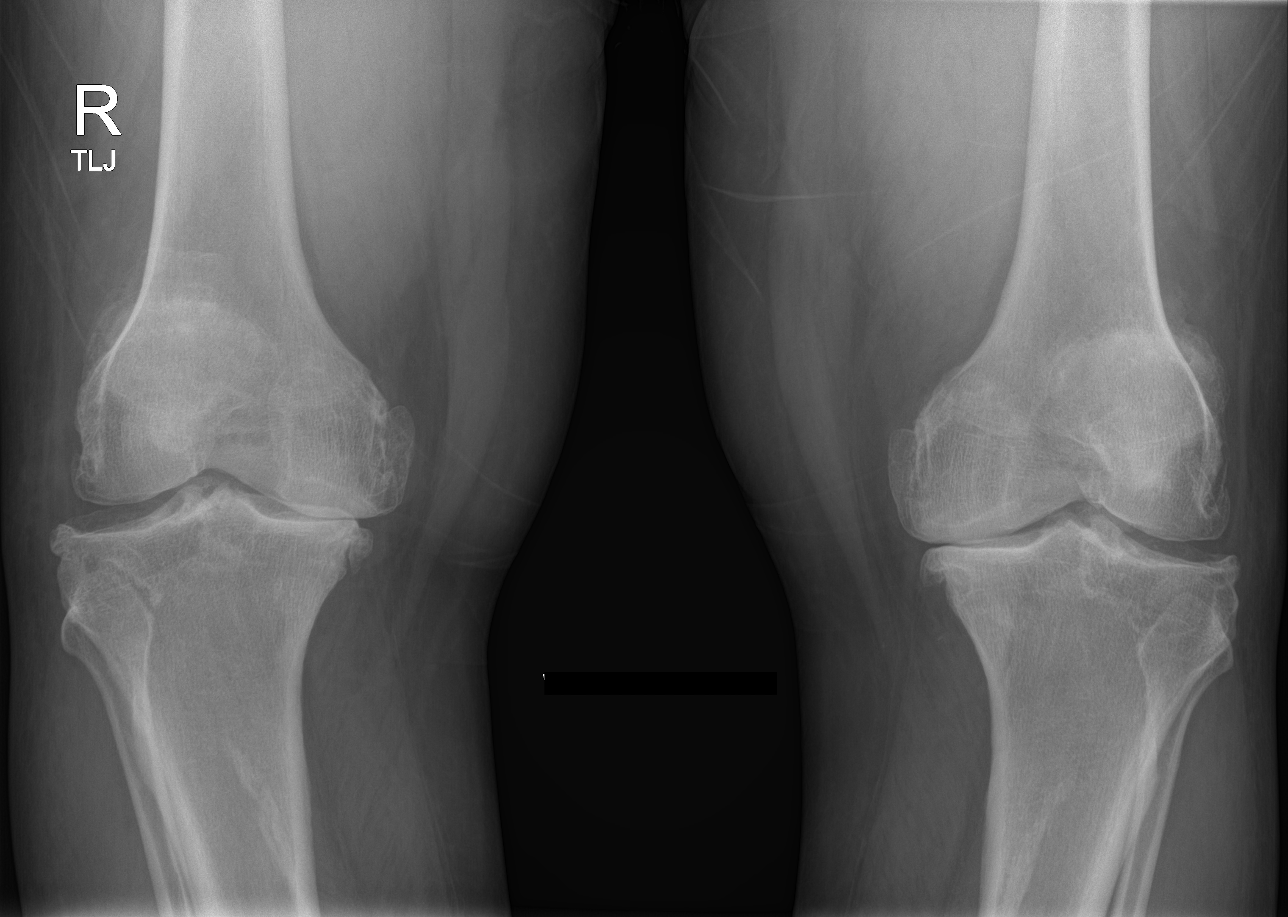

[knee lat]
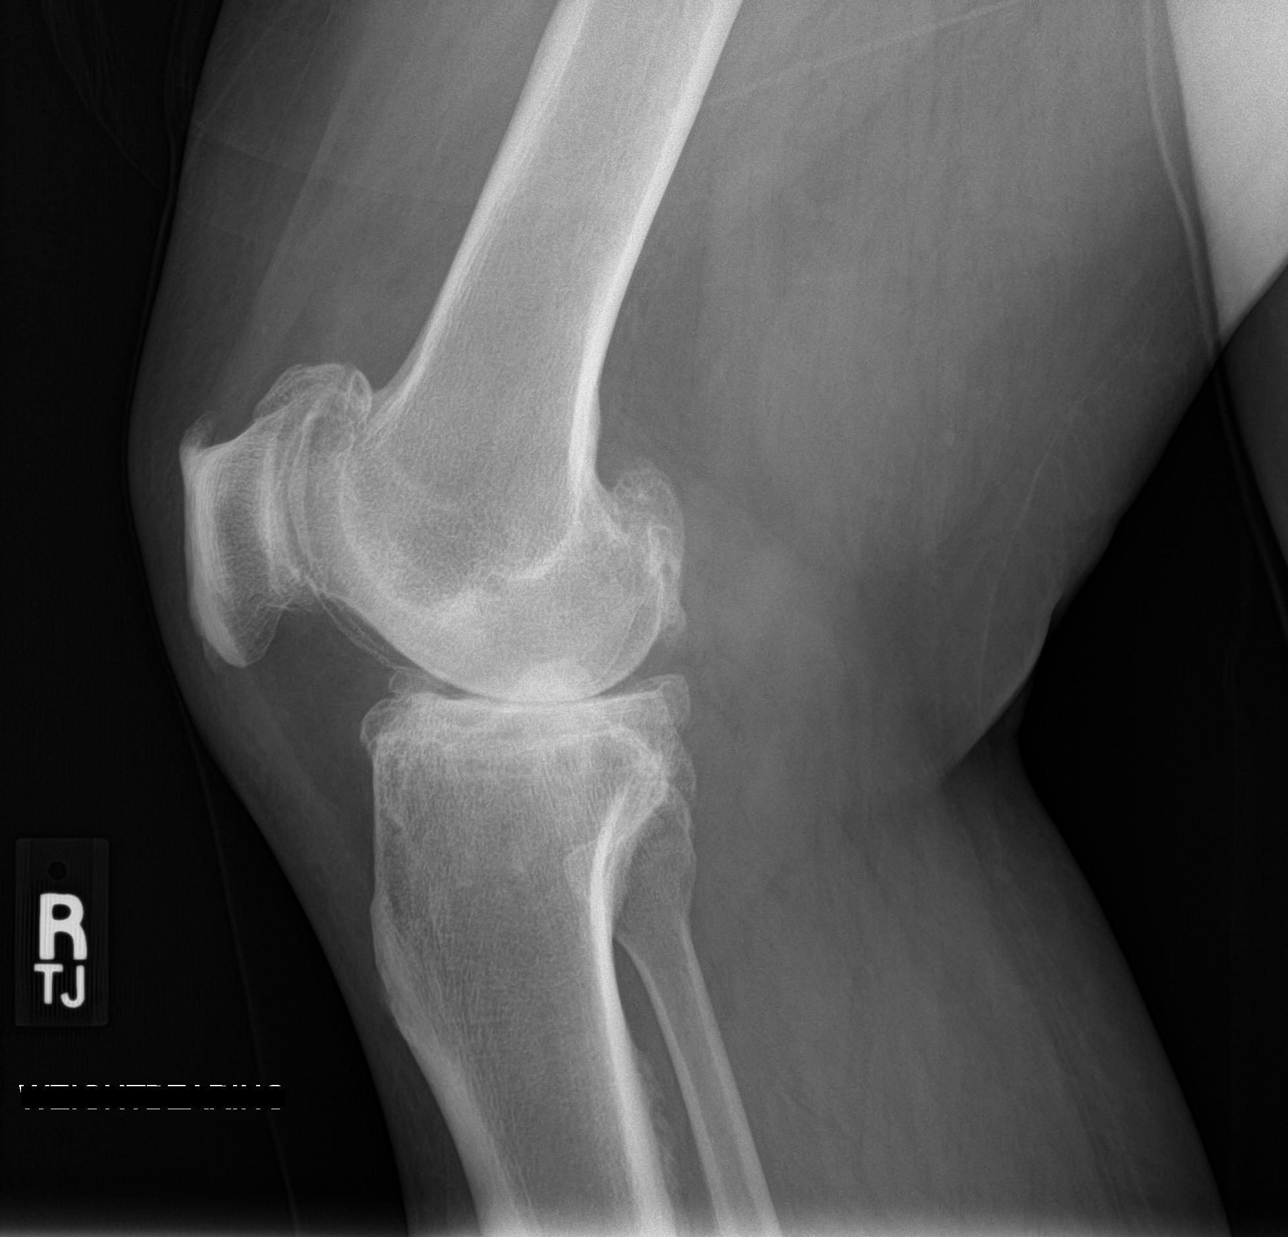

[patella skyline]
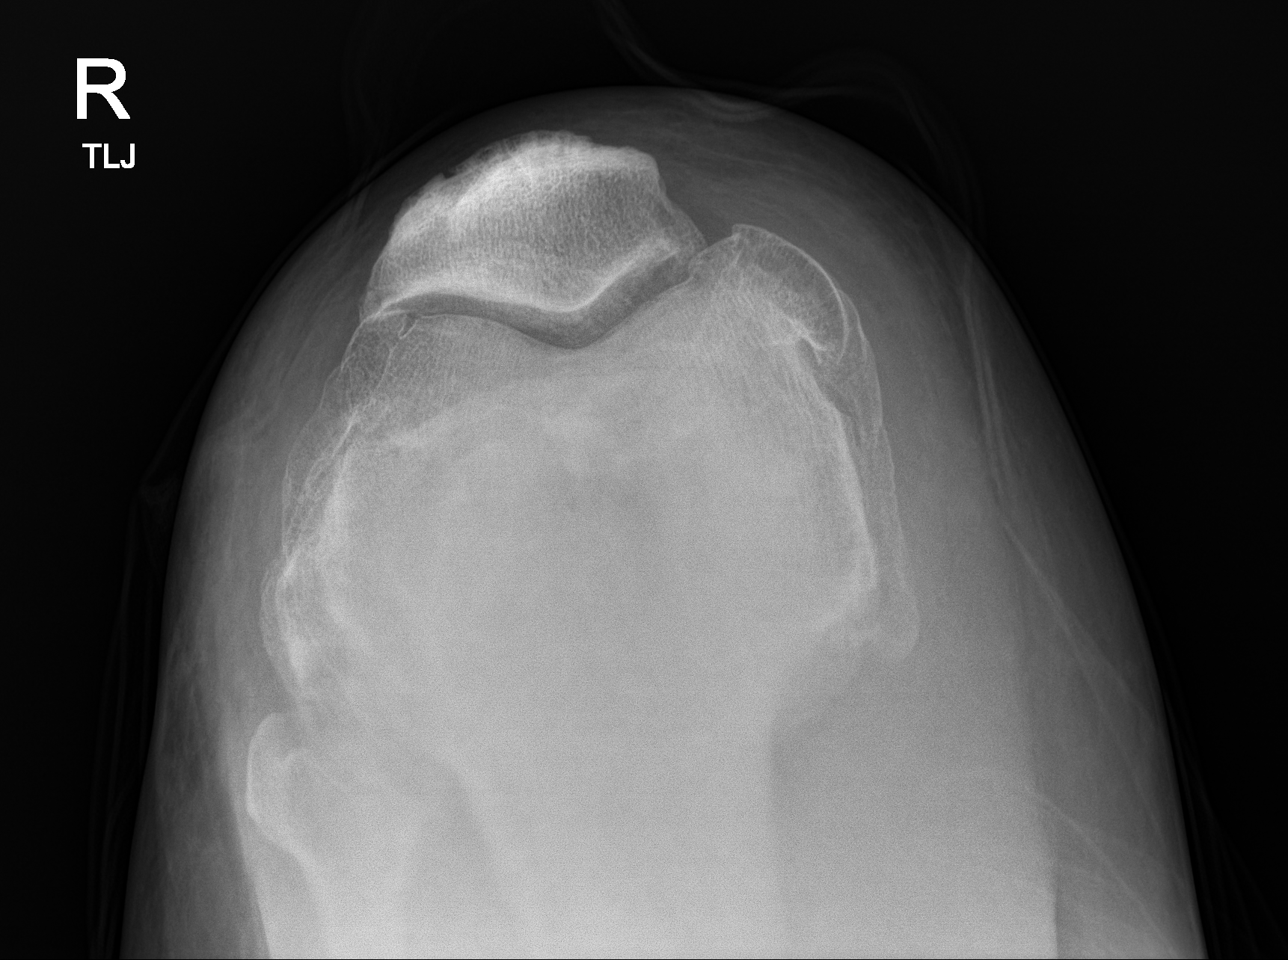

[knee tunnel]
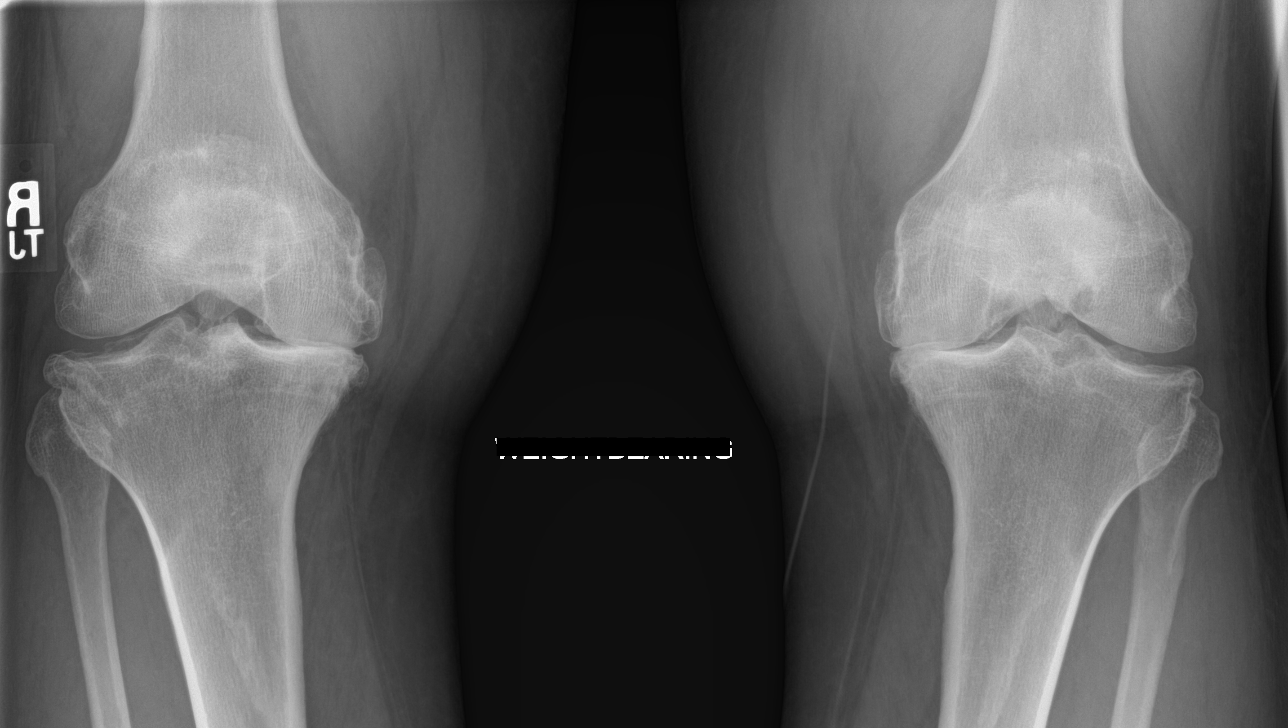

[4 of 4 positions shown; findings below may reference images not displayed]

FINDINGS: Weightbearing frontal, weight-bearing tunnel, weight-bearing
lateral, and sunrise patellar images were obtained. No fracture or
dislocation. No appreciable joint effusion. There is marked joint
space narrowing medially and in the patellofemoral joint regions.
There is diffuse bony overgrowth along the posterior patella in the
patellofemoral joint region. There are spurs along the anterior
aspect of the patella superiorly and inferiorly. No erosive change.
IMPRESSION: Extensive osteoarthritic change, most severe medially and in the
patellofemoral joint. No fracture, dislocation, or effusion.
Probable distal quadriceps and proximal patellar tendinosis given
anterior foci of patellar spurring.

## 2020-07-14 NOTE — Progress Notes (Signed)
    Claretha Townshend T. Marguriete Wootan, MD, [place] at [facility] [place], [address]  Phone: [phone]  FAX: [place] - 54 y.o. male  MRN [id number]  Date of Birth: 1966/06/14  Date: 07/15/2020  PCP: Owens Loffler, MD  Referral: Owens Loffler, MD  Chief Complaint  Patient presents with  . Knee Pain    Bilateral Injections    This visit occurred during the SARS-CoV-2 public health emergency.  Safety protocols were in place, including screening questions prior to the visit, additional usage of staff PPE, and extensive cleaning of exam room while observing appropriate contact time as indicated for disinfecting solutions.    [name] is a well-known patient, and he has known end-stage degenerative joint disease of both knees.  He also is morbidly obese and weighs approximately 275 pounds.  He has had some relief of symptoms with knee injection steroid in the past.  Reasonable plan.  Aspiration/Injection Procedure Note Eric Frederick 1966-06-01 Date of procedure: 07/15/2020  Procedure: Large Joint Joint Aspiration / Injection of the Right Knee Indications: Pain  Procedure Details Patient verbally consented to procedure. Risks (including potential rare risk of infection), benefits, and alternatives explained. Sterilely prepped with Chloraprep. Ethyl cholride used for anesthesia. 8 cc Lidocaine 1% mixed with 2 mL Depo-Medrol 40 mg injected using the anteromedial approach without difficulty. No complications with procedure and tolerated well. Patient had decreased pain post-injection.  Medication: 2 mL of Depo-Medrol 40 mg, equaling Depo-Medrol 80 mg total  Aspiration/Injection Procedure Note Eric Frederick 1966/05/22 Date of procedure: 07/15/2020  Procedure: Large Joint Aspiration / Injection of the Left Knee Indications: Pain  Procedure Details Patient verbally consented to  procedure. Risks (including potential rare risk of infection), benefits, and alternatives explained. Sterilely prepped with Chloraprep. Ethyl cholride used for anesthesia. 8 cc Lidocaine 1% mixed with 2 mL Depo-Medrol 40 mg injected using the anteromedial approach without difficulty. No complications with procedure and tolerated well. Patient had decreased pain post-injection.  Medication: 2 mL of Depo-Medrol 40 mg, equaling Depo-Medrol 80 mg total  Signed,  Jaydn Fincher T. Cylis Ayars, MD

## 2020-07-15 ENCOUNTER — Other Ambulatory Visit: Payer: Self-pay

## 2020-07-15 ENCOUNTER — Ambulatory Visit: Payer: 59 | Admitting: Family Medicine

## 2020-07-15 ENCOUNTER — Encounter: Payer: Self-pay | Admitting: Family Medicine

## 2020-07-15 VITALS — BP 148/80 | HR 101 | Temp 98.4°F | Ht 65.5 in | Wt 277.0 lb

## 2020-07-15 DIAGNOSIS — M170 Bilateral primary osteoarthritis of knee: Secondary | ICD-10-CM | POA: Diagnosis not present

## 2020-07-15 MED ORDER — METHYLPREDNISOLONE ACETATE 40 MG/ML IJ SUSP
80.0000 mg | Freq: Once | INTRAMUSCULAR | Status: AC
  Administered 2020-07-15: 80 mg via INTRA_ARTICULAR

## 2020-07-15 NOTE — Addendum Note (Signed)
Addended by: Klier Kitten on: 07/15/2020 09:22 AM   Modules accepted: Orders

## 2020-09-15 ENCOUNTER — Other Ambulatory Visit: Payer: Self-pay | Admitting: Family Medicine

## 2020-10-11 ENCOUNTER — Other Ambulatory Visit: Payer: Self-pay | Admitting: Family Medicine

## 2020-10-19 ENCOUNTER — Telehealth: Payer: Self-pay | Admitting: Family Medicine

## 2020-10-19 NOTE — Telephone Encounter (Signed)
HAVING A PROBLEM WITH GETTING THE METFORMIN.

## 2020-10-20 NOTE — Telephone Encounter (Signed)
Spoke with Eric Frederick.  He states he was able to get in touch with [facility] and they had the refill on file and is getting it ready for him.  He is also asking for an appointment to get his knees injected.  Appointment scheduled 10/26/2020 at 8:00 am with Dr. Lorelei Pont.

## 2020-10-25 NOTE — Progress Notes (Signed)
    Eric Delpriore T. Kamau Weatherall, MD, Eric [place], [address]  Phone: [phone]  FAX: [place] - 54 y.o. male  MRN [id number]  Date of Birth: 1966-06-29  Date: 10/26/2020  PCP: Owens Loffler, MD  Referral: Owens Loffler, MD  Chief Complaint  Patient presents with  . Knee Pain    Bilateral Knee Injections    This visit occurred during the SARS-CoV-2 public health emergency.  Safety protocols were in place, including screening questions prior to the visit, additional usage of staff PPE, and extensive cleaning of exam room while observing appropriate contact time as indicated for disinfecting solutions.    He is a very pleasant gentleman who has known severe osteoarthritis of both knees.  I last did a radiograph in May 2021.  At this point, we are trying to do anything from a conservative measure to help him with pain control.  At this point he had wanted to be conservative.  Challenging case with a BMI of Body mass index is 46.42 kg/m.   Aspiration/Injection Procedure Note [name] 22-Sep-1966 Date of procedure: 10/26/2020  Procedure: Large Joint Joint Aspiration / Injection of the Right Knee Indications: Pain  Procedure Details Patient verbally consented to procedure. Risks (including potential rare risk of infection), benefits, and alternatives explained. Sterilely prepped with Chloraprep. Ethyl cholride used for anesthesia. 9 cc Lidocaine 1% mixed with 1 mL Kenalog 40 mg injected using the anteromedial approach without difficulty. No complications with procedure and tolerated well. Patient had decreased pain post-injection.  Medication: 1 mL of Kenalog 40 mg  Aspiration/Injection Procedure Note [name] 08/17/1966 Date of procedure: 10/26/2020  Procedure: Large Joint Aspiration / Injection of the Left Knee Indications:  Pain  Procedure Details Patient verbally consented to procedure. Risks (including potential rare risk of infection), benefits, and alternatives explained. Sterilely prepped with Chloraprep. Ethyl cholride used for anesthesia. 9 cc Lidocaine 1% mixed with 1 mL Kenalog 40 mg injected using the anteromedial approach without difficulty. No complications with procedure and tolerated well. Patient had decreased pain post-injection.  Medication: 1 mL of Kenalog 40 mg  Signed,  Angel Hobdy T. Nero Sawatzky, MD

## 2020-10-26 ENCOUNTER — Other Ambulatory Visit: Payer: Self-pay

## 2020-10-26 ENCOUNTER — Ambulatory Visit: Payer: 59 | Admitting: Family Medicine

## 2020-10-26 ENCOUNTER — Encounter: Payer: Self-pay | Admitting: Family Medicine

## 2020-10-26 VITALS — BP 140/72 | HR 100 | Temp 97.2°F | Ht 65.5 in | Wt 283.2 lb

## 2020-10-26 DIAGNOSIS — M170 Bilateral primary osteoarthritis of knee: Secondary | ICD-10-CM

## 2020-10-26 DIAGNOSIS — Z23 Encounter for immunization: Secondary | ICD-10-CM | POA: Diagnosis not present

## 2020-10-26 MED ORDER — TRIAMCINOLONE ACETONIDE 40 MG/ML IJ SUSP
40.0000 mg | Freq: Once | INTRAMUSCULAR | Status: AC
  Administered 2020-10-26: 40 mg via INTRA_ARTICULAR

## 2020-10-26 MED ORDER — TRIAMCINOLONE ACETONIDE 40 MG/ML IJ SUSP
40.0000 mg | Freq: Once | INTRAMUSCULAR | Status: AC
Start: 2020-10-26 — End: 2020-10-26
  Administered 2020-10-26: 40 mg via INTRA_ARTICULAR

## 2020-10-26 NOTE — Addendum Note (Signed)
Addended by: Lamphier Kitten on: 10/26/2020 08:36 AM   Modules accepted: Orders

## 2020-11-09 ENCOUNTER — Other Ambulatory Visit: Payer: Self-pay | Admitting: Family Medicine

## 2020-12-08 ENCOUNTER — Other Ambulatory Visit: Payer: Self-pay | Admitting: Family Medicine

## 2020-12-08 DIAGNOSIS — E1159 Type 2 diabetes mellitus with other circulatory complications: Secondary | ICD-10-CM

## 2021-01-27 ENCOUNTER — Telehealth: Payer: Self-pay | Admitting: Family Medicine

## 2021-01-27 NOTE — Telephone Encounter (Signed)
Spoke with Eric Frederick.  He just wanted to schedule appointment with Dr. Lorelei Pont for knee injections.  Appointment schedule 02/01/2021 at 8:00 am.

## 2021-01-27 NOTE — Telephone Encounter (Signed)
Pt called wanted to know if Butch Penny could call him

## 2021-02-01 ENCOUNTER — Ambulatory Visit: Payer: 59 | Admitting: Family Medicine

## 2021-02-01 ENCOUNTER — Encounter: Payer: Self-pay | Admitting: Family Medicine

## 2021-02-01 ENCOUNTER — Other Ambulatory Visit: Payer: Self-pay

## 2021-02-01 VITALS — BP 132/80 | HR 113 | Temp 97.9°F | Ht 65.5 in | Wt 286.5 lb

## 2021-02-01 DIAGNOSIS — M170 Bilateral primary osteoarthritis of knee: Secondary | ICD-10-CM | POA: Diagnosis not present

## 2021-02-01 MED ORDER — TRIAMCINOLONE ACETONIDE 40 MG/ML IJ SUSP
40.0000 mg | Freq: Once | INTRAMUSCULAR | Status: AC
  Administered 2021-02-01: 40 mg via INTRA_ARTICULAR

## 2021-02-01 NOTE — Progress Notes (Signed)
    Naya Ilagan T. Inigo Lantigua, MD, [place] at [facility] [place], [address]  Phone: [phone]  FAX: [place] - 55 y.o. male  MRN [id number]  Date of Birth: 1966-01-30  Date: 02/01/2021  PCP: Owens Loffler, MD  Referral: Owens Loffler, MD  Chief Complaint  Patient presents with  . Knee Pain    Bilateral Injections    This visit occurred during the SARS-CoV-2 public health emergency.  Safety protocols were in place, including screening questions prior to the visit, additional usage of staff PPE, and extensive cleaning of exam room while observing appropriate contact time as indicated for disinfecting solutions.   Subjective:   SERGE MAIN is a 55 y.o. very pleasant male patient with Body mass index is 46.95 kg/m. who presents with the following:  Procedure only, known severe B knee OA:  Tyrice also wanted to talk about total knee arthroplasty.  We reviewed his x-rays again which show bone-on-bone pathology, and also reviewed some images on the Internet.  At this point he is ready to consider surgery, and I made a consult for total joint surgeon.  Body mass index is 46.95 kg/m.  Lab Results  Component Value Date   HGBA1C 6.8 (H) 04/01/2020    In the meantime, I recommended that Gatsby work on his weight as much as possible.  Aspiration/Injection Procedure Note JUANITA DEVINCENT 08-Feb-1966 Date of procedure: 02/01/2021  Procedure: Large Joint Joint Aspiration / Injection of the Right Knee Indications: Pain  Procedure Details Patient verbally consented to procedure. Risks, benefits, and alternatives explained. Sterilely prepped with Chloraprep. Ethyl cholride used for anesthesia. 9 cc Lidocaine 1% mixed with 1 mL Kenalog 40 mg injected using the anteromedial approach without difficulty. No complications with procedure and tolerated well. Patient had  decreased pain post-injection.  Medication: 1 mL of Kenalog 40 mg  Aspiration/Injection Procedure Note ASCHER SCHROEPFER 12/27/1965 Date of procedure: 02/01/2021  Procedure: Large Joint Aspiration / Injection of the Left Knee Indications: Pain  Procedure Details Patient verbally consented to procedure. Risks, benefits, and alternatives explained. Sterilely prepped with Chloraprep. Ethyl cholride used for anesthesia. 9 cc Lidocaine 1% mixed with 1 mL Kenalog 40 mg injected using the anteromedial approach without difficulty. No complications with procedure and tolerated well. Patient had decreased pain post-injection.  Medication: 1 mL of Kenalog 40 mg  Signed,  Kinzley Savell T. Caileigh Canche, MD

## 2021-03-07 ENCOUNTER — Other Ambulatory Visit: Payer: Self-pay | Admitting: Family Medicine

## 2021-03-07 DIAGNOSIS — E1159 Type 2 diabetes mellitus with other circulatory complications: Secondary | ICD-10-CM

## 2021-03-07 DIAGNOSIS — N521 Erectile dysfunction due to diseases classified elsewhere: Secondary | ICD-10-CM

## 2021-03-23 ENCOUNTER — Telehealth: Payer: Self-pay | Admitting: Family Medicine

## 2021-03-24 NOTE — Telephone Encounter (Signed)
Please schedule CPE with fasting labs prior with Dr. Lorelei Pont for sometime late April or early May.

## 2021-03-24 NOTE — Telephone Encounter (Signed)
Spoke with patient scheduled CPE with fasting labs 

## 2021-04-07 ENCOUNTER — Other Ambulatory Visit: Payer: Self-pay | Admitting: Family Medicine

## 2021-04-07 LAB — HEMOGLOBIN A1C: Hemoglobin A1C: 8

## 2021-04-13 ENCOUNTER — Other Ambulatory Visit: Payer: Self-pay | Admitting: *Deleted

## 2021-04-13 MED ORDER — OMEPRAZOLE 40 MG PO CPDR: 1.0000 | DELAYED_RELEASE_CAPSULE | Freq: Every day | ORAL | 0 refills | Status: DC

## 2021-04-14 ENCOUNTER — Other Ambulatory Visit: Payer: 59

## 2021-04-21 ENCOUNTER — Encounter: Payer: Self-pay | Admitting: Family Medicine

## 2021-04-21 ENCOUNTER — Ambulatory Visit (INDEPENDENT_AMBULATORY_CARE_PROVIDER_SITE_OTHER): Payer: 59 | Admitting: Family Medicine

## 2021-04-21 ENCOUNTER — Other Ambulatory Visit: Payer: Self-pay

## 2021-04-21 VITALS — BP 138/80 | HR 110 | Temp 98.3°F | Ht 65.0 in | Wt 279.0 lb

## 2021-04-21 DIAGNOSIS — Z125 Encounter for screening for malignant neoplasm of prostate: Secondary | ICD-10-CM

## 2021-04-21 DIAGNOSIS — Z114 Encounter for screening for human immunodeficiency virus [HIV]: Secondary | ICD-10-CM

## 2021-04-21 DIAGNOSIS — Z0000 Encounter for general adult medical examination without abnormal findings: Secondary | ICD-10-CM

## 2021-04-21 DIAGNOSIS — E1159 Type 2 diabetes mellitus with other circulatory complications: Secondary | ICD-10-CM

## 2021-04-21 DIAGNOSIS — N521 Erectile dysfunction due to diseases classified elsewhere: Secondary | ICD-10-CM

## 2021-04-21 DIAGNOSIS — Z1159 Encounter for screening for other viral diseases: Secondary | ICD-10-CM

## 2021-04-21 MED ORDER — PIOGLITAZONE HCL 30 MG PO TABS: 30.0000 mg | ORAL_TABLET | Freq: Every day | ORAL | 3 refills | Status: DC

## 2021-04-21 MED ORDER — OMEPRAZOLE 40 MG PO CPDR: 1.0000 | DELAYED_RELEASE_CAPSULE | Freq: Every day | ORAL | 3 refills | Status: DC

## 2021-04-21 MED ORDER — SILDENAFIL CITRATE 100 MG PO TABS: 50.0000 mg | ORAL_TABLET | Freq: Every day | ORAL | 11 refills | Status: DC | PRN

## 2021-04-21 MED ORDER — METFORMIN HCL 1000 MG PO TABS: 1.0000 | ORAL_TABLET | Freq: Two times a day (BID) | ORAL | 3 refills | Status: DC

## 2021-04-21 MED ORDER — AMLODIPINE BESYLATE 5 MG PO TABS: 1.0000 | ORAL_TABLET | Freq: Every day | ORAL | 3 refills | Status: DC

## 2021-04-21 MED ORDER — TRULICITY 1.5 MG/0.5ML ~~LOC~~ SOAJ: SUBCUTANEOUS | 3 refills | Status: DC

## 2021-04-21 NOTE — Progress Notes (Signed)
Glenisha Gundry T. Amar Sippel, MD, [place] at [facility] [place], [address]  Phone: [phone]  FAX: [place] - 55 y.o. male  MRN [id number]  Date of Birth: December 21, 1965  Date: 04/21/2021  PCP: Owens Loffler, MD  Referral: Owens Loffler, MD  Chief Complaint  Patient presents with  . Annual Exam    This visit occurred during the SARS-CoV-2 public health emergency.  Safety protocols were in place, including screening questions prior to the visit, additional usage of staff PPE, and extensive cleaning of exam room while observing appropriate contact time as indicated for disinfecting solutions.   Patient Care Team: Owens Loffler, MD as PCP - General Subjective:   Eric Frederick is a 55 y.o. pleasant patient who presents with the following:  Preventative Health Maintenance Visit:  Health Maintenance Summary Reviewed and updated, unless pt declines services.  Tobacco History Reviewed. Alcohol: No concerns, no excessive use Exercise Habits: walks a lot at work STD concerns: no risk or activity to increase risk Drug Use: None  He is going to [employer] - has seen them once  I did review his lab work from [employer]. Globally everything is fine with the exception of his A1c at 8.  His lipids are remarkably low.  a1c is at 8 now Compliant with all of his meds Going to bariatrics  Lab Results  Component Value Date   HGBA1C 8.0 04/07/2021     Wt Readings from Last 3 Encounters:  04/21/21 279 lb (126.6 kg)  02/01/21 286 lb 8 oz (130 kg)  10/26/20 283 lb 4 oz (128.5 kg)  Lost 8 pounds  Health Maintenance  Topic Date Due  . HIV Screening  Never done  . Hepatitis C Screening  Never done  . FOOT EXAM  10/27/2016  . OPHTHALMOLOGY EXAM  02/28/2019  . HEMOGLOBIN A1C  10/01/2020  . COLONOSCOPY (Pts 45-73yrs Insurance coverage will need to be confirmed)   06/29/2021  . INFLUENZA VACCINE  07/12/2021  . TETANUS/TDAP  03/14/2023  . PNEUMOCOCCAL POLYSACCHARIDE VACCINE AGE 8-64 HIGH RISK  Completed  . COVID-19 Vaccine  Completed  . HPV VACCINES  Aged Out   Immunization History  Administered Date(s) Administered  . Influenza Split 09/12/2012  . Influenza,inj,Quad PF,6+ Mos 10/22/2014, 10/28/2015, 10/04/2017, 09/26/2019, 10/26/2020  . PFIZER(Purple Top)SARS-COV-2 Vaccination 02/25/2020, 03/17/2020, 10/20/2020  . Pneumococcal Conjugate-13 10/22/2014  . Pneumococcal Polysaccharide-23 03/13/2013, 12/20/2017  . Tdap 03/13/2013   Patient Active Problem List   Diagnosis Date Noted  . Severe bilateral knee arthritis 04/14/2020  . Hiatal hernia 04/22/2015  . Severe obesity (BMI >= 40) ([place]) 03/12/2014  . History of noncompliance with medical treatment 03/14/2012  . Erectile dysfunction associated with type 2 diabetes mellitus ([place]) 03/02/2011  . Type 2 diabetes with circulatory disorder causing erectile dysfunction ([place]) 07/21/2010  . HYPERCHOLESTEROLEMIA 07/21/2010  . Essential hypertension 07/21/2010    Past Medical History:  Diagnosis Date  . Diabetes mellitus   . Diverticulosis   . GERD (gastroesophageal reflux disease)   . Hiatal hernia 04/22/2015  . Hyperlipidemia   . Hypertension   . Serrated adenoma of colon 06/2016    Past Surgical History:  Procedure Laterality Date  . SHOULDER ARTHROSCOPY W/ ROTATOR CUFF REPAIR Right 2014   [facility]    Family History  Problem Relation Age of Onset  . Other Father        complications from agent orange  .  Colon cancer Neg Hx     Past Medical History, Surgical History, Social History, Family History, Problem List, Medications, and Allergies have been reviewed and updated if relevant.  Review of Systems: Pertinent positives are listed above.  Otherwise, a full 14 point review of systems has been done in full and it is negative except where it is noted positive.  Objective:   BP 138/80    Pulse (!) 110   Temp 98.3 F (36.8 C) (Temporal)   Ht 5\' 5"  (1.651 m)   Wt 279 lb (126.6 kg)   SpO2 95%   BMI 46.43 kg/m  Ideal Body Weight: Weight in (lb) to have BMI = 25: 149.9  Ideal Body Weight: Weight in (lb) to have BMI = 25: 149.9 No exam data present Depression screen [facility] 2/9 04/21/2021 04/08/2020 07/11/2018 06/29/2017  Decreased Interest 0 0 0 0  Down, Depressed, Hopeless 0 0 0 0  PHQ - 2 Score 0 0 0 0     GEN: well developed, well nourished, no acute distress Eyes: conjunctiva and lids normal, PERRLA, EOMI ENT: TM clear, nares clear, oral exam WNL Neck: supple, no lymphadenopathy, no thyromegaly, no JVD Pulm: clear to auscultation and percussion, respiratory effort normal CV: regular rate and rhythm, S1-S2, no murmur, rub or gallop, no bruits, peripheral pulses normal and symmetric, no cyanosis, clubbing, edema or varicosities GI: soft, non-tender; no hepatosplenomegaly, masses; active bowel sounds all quadrants GU: deferred Lymph: no cervical, axillary or inguinal adenopathy MSK: gait normal, muscle tone and strength WNL, no joint swelling, effusions, discoloration, crepitus  SKIN: clear, good turgor, color WNL, no rashes, lesions, or ulcerations Neuro: normal mental status, normal strength, sensation, and motion Psych: alert; oriented to person, place and time, normally interactive and not anxious or depressed in appearance.  All labs reviewed with patient. Results for orders placed or performed in visit on 12/20/30  Basic metabolic panel  Result Value Ref Range   Sodium 139 135 - 145 mEq/L   Potassium 4.5 3.5 - 5.1 mEq/L   Chloride 102 96 - 112 mEq/L   CO2 29 19 - 32 mEq/L   Glucose, Bld 196 (H) 70 - 99 mg/dL   BUN 17 6 - 23 mg/dL   Creatinine, Ser 0.65 0.40 - 1.50 mg/dL   GFR 154.91 >60.00 mL/min   Calcium 9.3 8.4 - 10.5 mg/dL  Hepatic function panel  Result Value Ref Range   Total Bilirubin 0.4 0.2 - 1.2 mg/dL   Bilirubin, Direct 0.1 0.0 - 0.3 mg/dL    Alkaline Phosphatase 93 39 - 117 U/L   AST 13 0 - 37 U/L   ALT 21 0 - 53 U/L   Total Protein 6.4 6.0 - 8.3 g/dL   Albumin 4.2 3.5 - 5.2 g/dL  Microalbumin / creatinine urine ratio  Result Value Ref Range   Microalb, Ur 1.7 0.0 - 1.9 mg/dL   Creatinine,U 102.5 mg/dL   Microalb Creat Ratio 1.6 0.0 - 30.0 mg/g  Hemoglobin A1c  Result Value Ref Range   Hgb A1c MFr Bld 6.8 (H) 4.6 - 6.5 %  Lipid panel  Result Value Ref Range   Cholesterol 168 0 - 200 mg/dL   Triglycerides 52.0 0.0 - 149.0 mg/dL   HDL 48.30 >39.00 mg/dL   VLDL 10.4 0.0 - 40.0 mg/dL   LDL Cholesterol 110 (H) 0 - 99 mg/dL   Total CHOL/HDL Ratio 3    NonHDL 119.96     Assessment and Plan:  ICD-10-CM   1. Healthcare maintenance  Z00.00    Work on diet, exercise. I encouraged him about Bariatrics.   Increase Actos to 30 mg to try to optimize in case he pursues surgery. a1c = 8  Health Maintenance Exam: The patient's preventative maintenance and recommended screening tests for an annual wellness exam were reviewed in full today. Brought up to date unless services declined.  Counselled on the importance of diet, exercise, and its role in overall health and mortality. The patient's FH and SH was reviewed, including their home life, tobacco status, and drug and alcohol status.  Follow-up in 1 year for physical exam or additional follow-up below.  Follow-up: No follow-ups on file. Or follow-up in 1 year if not noted.  No orders of the defined types were placed in this encounter.  There are no discontinued medications. No orders of the defined types were placed in this encounter.   Signed,  Maud Deed. Shatona Andujar, MD   Allergies as of 04/21/2021      Reactions   Glipizide       Medication List       Accurate as of Apr 21, 2021  8:54 AM. If you have any questions, ask your nurse or doctor.        amLODipine 5 MG tablet Commonly known as: NORVASC Take 1 tablet by mouth once daily   metFORMIN 1000 MG  tablet Commonly known as: GLUCOPHAGE Take 1 tablet by mouth twice daily   omeprazole 40 MG capsule Commonly known as: PRILOSEC Take 1 capsule (40 mg total) by mouth daily.   pioglitazone 15 MG tablet Commonly known as: ACTOS Take 1 tablet by mouth once daily   sildenafil 100 MG tablet Commonly known as: Viagra Take 0.5-1 tablets (50-100 mg total) by mouth daily as needed for erectile dysfunction.   Trulicity 1.5 AO/1.3YQ Sopn Generic drug: Dulaglutide INJECT 1 SYRINGE SUBCUTANEOUSLY ONCE A WEEK

## 2021-04-22 LAB — HEPATITIS C ANTIBODY
Hepatitis C Ab: NONREACTIVE
SIGNAL TO CUT-OFF: 0 (ref <1.00)

## 2021-04-22 LAB — PSA, TOTAL WITH REFLEX TO PSA, FREE: PSA, Total: 0.9 ng/mL (ref <=4.0)

## 2021-04-22 LAB — HIV ANTIBODY (ROUTINE TESTING W REFLEX): HIV 1&2 Ab, 4th Generation: NONREACTIVE

## 2021-07-07 ENCOUNTER — Telehealth: Payer: Self-pay | Admitting: Family Medicine

## 2021-07-07 NOTE — Telephone Encounter (Signed)
Can you call  I got a preop clearance from Dr. Maureen Ralphs for knee replacement for him.  The last time I saw him his a1c and BMI were too high to be approved for a joint repacement.  I know he is seeing Bariatrics.   If his blood sugar is down, and he continues to lose weight, then I can do a preop work-up for him.  Otherwise, f/u with me in 1 month for diabetes and weight recheck.

## 2021-07-08 NOTE — Telephone Encounter (Signed)
Spoke with Lanny Hurst and scheduled pre-op clearance appointment with Dr. Lorelei Pont on 07/12/2021 at 2:40 pm.

## 2021-07-11 NOTE — Progress Notes (Signed)
Rakan Soffer T. Mallie Giambra, MD, [place] at [facility] [place], [address]  Phone: [phone]  FAX: [place] - 55 y.o. male  MRN [id number]  Date of Birth: 02-28-1966  Date: 07/12/2021  PCP: Owens Loffler, MD  Referral: Owens Loffler, MD  Chief Complaint  Patient presents with   Surgical Clearance    This visit occurred during the SARS-CoV-2 public health emergency.  Safety protocols were in place, including screening questions prior to the visit, additional usage of staff PPE, and extensive cleaning of exam room while observing appropriate contact time as indicated for disinfecting solutions.   Subjective:   Dear Dr. Ricki Rodriguez,  Mr. Eric Frederick is here for a preoperative medical consultation from Dr. Ricki Rodriguez regarding medical fitness for left knee joint arthroplasty based on [facility] standards of care.   This is for a L total knee arthroplasty, and the patient does have severe osteoarthritis of both knees.  He already has an surgery date from approximately 1 month in the future.  I applauded Jmari for losing weight, and his hemoglobin A1c is at now 6.3.  He is very compliant, and he has done a great job with this.  His BMI is currently 43, and he continues to lose weight. Body mass index is 42.52 kg/m.   Wt Readings from Last 3 Encounters:  07/12/21 255 lb 8 oz (115.9 kg)  04/21/21 279 lb (126.6 kg)  02/01/21 286 lb 8 oz (130 kg)     BP Readings from Last 3 Encounters:  07/12/21 138/82  04/21/21 138/80  02/01/21 132/80    BP is stable as above  His goal weight is 200 pounds.   Lab Results  Component Value Date   HGBA1C 6.3 (A) 07/12/2021    Review of Systems is noted in the HPI, as appropriate  Patient Active Problem List   Diagnosis Date Noted   Severe bilateral knee arthritis 04/14/2020   Hiatal hernia 04/22/2015   Severe obesity (BMI >= 40) ([place]) 03/12/2014   Erectile  dysfunction associated with type 2 diabetes mellitus ([place]) 03/02/2011   Type 2 diabetes with circulatory disorder causing erectile dysfunction ([place]) 07/21/2010   HYPERCHOLESTEROLEMIA 07/21/2010   Essential hypertension 07/21/2010    Past Medical History:  Diagnosis Date   Diabetes mellitus    Diverticulosis    GERD (gastroesophageal reflux disease)    Hiatal hernia 04/22/2015   Hyperlipidemia    Hypertension    Serrated adenoma of colon 06/2016    Past Surgical History:  Procedure Laterality Date   SHOULDER ARTHROSCOPY W/ ROTATOR CUFF REPAIR Right 2014   [facility]    Family History  Problem Relation Age of Onset   Other Father        complications from agent orange   Colon cancer Neg Hx      Objective:   BP 138/82   Pulse (!) 107   Temp 98.1 F (36.7 C) (Temporal)   Ht '5\' 5"'$  (1.651 m)   Wt 255 lb 8 oz (115.9 kg)   SpO2 95%   BMI 42.52 kg/m   GEN: No acute distress; alert,appropriate. PULM: Breathing comfortably in no respiratory distress PSYCH: Normally interactive.  CV: RRR, no m/g/r  PULM: Normal respiratory rate, no accessory muscle use. No wheezes, crackles or rhonchi   Laboratory and Imaging Data: Results for orders placed or performed in visit on 07/12/21  CBC with Differential/Platelet  Result Value Ref Range   WBC  9.1 4.0 - 10.5 K/uL   RBC 4.83 4.22 - 5.81 Mil/uL   Hemoglobin 14.1 13.0 - 17.0 g/dL   HCT 43.5 39.0 - 52.0 %   MCV 90.2 78.0 - 100.0 fl   MCHC 32.3 30.0 - 36.0 g/dL   RDW 13.3 11.5 - 15.5 %   Platelets 321.0 150.0 - 400.0 K/uL   Neutrophils Relative % 64.2 43.0 - 77.0 %   Lymphocytes Relative 23.6 12.0 - 46.0 %   Monocytes Relative 10.1 3.0 - 12.0 %   Eosinophils Relative 1.7 0.0 - 5.0 %   Basophils Relative 0.4 0.0 - 3.0 %   Neutro Abs 5.8 1.4 - 7.7 K/uL   Lymphs Abs 2.1 0.7 - 4.0 K/uL   Monocytes Absolute 0.9 0.1 - 1.0 K/uL   Eosinophils Absolute 0.2 0.0 - 0.7 K/uL   Basophils Absolute 0.0 0.0 - 0.1 K/uL  Protime-INR  Result  Value Ref Range   INR 1.0 0.8 - 1.0 ratio   Prothrombin Time 10.9 9.6 - 13.1 sec  Urinalysis, Routine w reflex microscopic  Result Value Ref Range   Color, Urine YELLOW Yellow;Lt. Yellow;Straw;Dark Yellow;Amber;Green;Red;Brown   APPearance CLEAR Clear;Turbid;Slightly Cloudy;Cloudy   Specific Gravity, Urine 1.020 1.000 - 1.030   pH 6.0 5.0 - 8.0   Total Protein, Urine NEGATIVE Negative   Urine Glucose NEGATIVE Negative   Ketones, ur NEGATIVE Negative   Bilirubin Urine NEGATIVE Negative   Hgb urine dipstick NEGATIVE Negative   Urobilinogen, UA 0.2 0.0 - 1.0   Leukocytes,Ua NEGATIVE Negative   Nitrite NEGATIVE Negative   WBC, UA none seen 0-2/hpf   RBC / HPF none seen 0-2/hpf   Squamous Epithelial / LPF Rare(0-4/hpf) Rare(0-4/hpf)  POCT glycosylated hemoglobin (Hb A1C)  Result Value Ref Range   Hemoglobin A1C 6.3 (A) 4.0 - 5.6 %   HbA1c POC (<> result, manual entry)     HbA1c, POC (prediabetic range)     HbA1c, POC (controlled diabetic range)       Assessment and Plan:     ICD-10-CM   1. Preop examination  Z01.818 CBC with Differential/Platelet    Protime-INR    Urinalysis, Routine w reflex microscopic    CANCELED: Basic metabolic panel    CANCELED: Hepatic function panel    2. HYPERCHOLESTEROLEMIA  E78.00     3. Primary localized osteoarthritis of left knee  M17.12 CBC with Differential/Platelet    Protime-INR    Urinalysis, Routine w reflex microscopic    CANCELED: Basic metabolic panel    CANCELED: Hepatic function panel    4. Type 2 diabetes with circulatory disorder causing erectile dysfunction (H[facility])  E11.59 POCT glycosylated hemoglobin (Hb A1C)   N52.1 CANCELED: Basic metabolic panel    5. Severe obesity (BMI >= 40) (H[facility])  E66.01     6. Essential hypertension  I10     7. Need for shingles vaccine  Z23 Varicella-zoster vaccine IM (Shingrix)     Preoperative exam requested by Dr. Ricki Rodriguez at [facility] in [place].  [facility] requires a BMI  less than 40 for joint arthroplasty.  Currently the patient's BMI is 43.  Based on [facility] requirements for medical fitness for total joint arthroplasty, the patient does not meet these criteria.    Eric Frederick's BMI of 43 stands as medical postponement for his knee replacement by their criteria, and his knee replacement should be postponed.  He is overall low risk for surgical complications with the exception of his BMI, labs are normal, and he  has done a great job managing his health.  I recommended that he contact his surgeon for additional questions.  Meds ordered this encounter  Medications   sildenafil (VIAGRA) 100 MG tablet    Sig: Take 0.5-1 tablets (50-100 mg total) by mouth daily as needed for erectile dysfunction.    Dispense:  5 tablet    Refill:  11   Medications Discontinued During This Encounter  Medication Reason   amLODipine (NORVASC) 5 MG tablet Change in therapy   Dulaglutide (TRULICITY) 1.5 0000000 SOPN Change in therapy   pioglitazone (ACTOS) 30 MG tablet Change in therapy   sildenafil (VIAGRA) 100 MG tablet Reorder   Orders Placed This Encounter  Procedures   Varicella-zoster vaccine IM (Shingrix)   CBC with Differential/Platelet   Protime-INR   Urinalysis, Routine w reflex microscopic   POCT glycosylated hemoglobin (Hb A1C)    Signed,  Lorin Gawron T. Maclean Foister, MD   Outpatient Encounter Medications as of 07/12/2021  Medication Sig   Dulaglutide (TRULICITY) 3 0000000 SOPN Inject 3 mg into the skin once a week.   lisinopril (ZESTRIL) 20 MG tablet Take 20 mg by mouth daily.   metFORMIN (GLUCOPHAGE) 1000 MG tablet Take 1 tablet (1,000 mg total) by mouth 2 (two) times daily.   omeprazole (PRILOSEC) 40 MG capsule Take 1 capsule (40 mg total) by mouth daily.   [DISCONTINUED] sildenafil (VIAGRA) 100 MG tablet Take 0.5-1 tablets (50-100 mg total) by mouth daily as needed for erectile dysfunction.   sildenafil (VIAGRA) 100 MG tablet Take 0.5-1 tablets (50-100 mg  total) by mouth daily as needed for erectile dysfunction.   [DISCONTINUED] amLODipine (NORVASC) 5 MG tablet Take 1 tablet (5 mg total) by mouth daily.   [DISCONTINUED] Dulaglutide (TRULICITY) 1.5 0000000 SOPN INJECT 1 SYRINGE SUBCUTANEOUSLY ONCE A WEEK   [DISCONTINUED] pioglitazone (ACTOS) 30 MG tablet Take 1 tablet (30 mg total) by mouth daily.   No facility-administered encounter medications on file as of 07/12/2021.

## 2021-07-12 ENCOUNTER — Other Ambulatory Visit: Payer: Self-pay

## 2021-07-12 ENCOUNTER — Ambulatory Visit: Payer: 59 | Admitting: Family Medicine

## 2021-07-12 ENCOUNTER — Encounter: Payer: Self-pay | Admitting: Family Medicine

## 2021-07-12 VITALS — BP 138/82 | HR 107 | Temp 98.1°F | Ht 65.0 in | Wt 255.5 lb

## 2021-07-12 DIAGNOSIS — E7800 Pure hypercholesterolemia, unspecified: Secondary | ICD-10-CM | POA: Diagnosis not present

## 2021-07-12 DIAGNOSIS — M1712 Unilateral primary osteoarthritis, left knee: Secondary | ICD-10-CM

## 2021-07-12 DIAGNOSIS — I10 Essential (primary) hypertension: Secondary | ICD-10-CM

## 2021-07-12 DIAGNOSIS — Z01818 Encounter for other preprocedural examination: Secondary | ICD-10-CM

## 2021-07-12 DIAGNOSIS — N521 Erectile dysfunction due to diseases classified elsewhere: Secondary | ICD-10-CM

## 2021-07-12 DIAGNOSIS — M170 Bilateral primary osteoarthritis of knee: Secondary | ICD-10-CM

## 2021-07-12 DIAGNOSIS — E1159 Type 2 diabetes mellitus with other circulatory complications: Secondary | ICD-10-CM

## 2021-07-12 DIAGNOSIS — Z23 Encounter for immunization: Secondary | ICD-10-CM | POA: Diagnosis not present

## 2021-07-12 LAB — POCT GLYCOSYLATED HEMOGLOBIN (HGB A1C): Hemoglobin A1C: 6.3 % — AB (ref 4.0–5.6)

## 2021-07-12 MED ORDER — SILDENAFIL CITRATE 100 MG PO TABS: 50.0000 mg | ORAL_TABLET | Freq: Every day | ORAL | 11 refills | Status: DC | PRN

## 2021-07-13 LAB — CBC WITH DIFFERENTIAL/PLATELET
Basophils Absolute: 0 10*3/uL (ref 0.0–0.1)
Basophils Relative: 0.4 % (ref 0.0–3.0)
Eosinophils Absolute: 0.2 10*3/uL (ref 0.0–0.7)
Eosinophils Relative: 1.7 % (ref 0.0–5.0)
HCT: 43.5 % (ref 39.0–52.0)
Hemoglobin: 14.1 g/dL (ref 13.0–17.0)
Lymphocytes Relative: 23.6 % (ref 12.0–46.0)
Lymphs Abs: 2.1 10*3/uL (ref 0.7–4.0)
MCHC: 32.3 g/dL (ref 30.0–36.0)
MCV: 90.2 fl (ref 78.0–100.0)
Monocytes Absolute: 0.9 10*3/uL (ref 0.1–1.0)
Monocytes Relative: 10.1 % (ref 3.0–12.0)
Neutro Abs: 5.8 10*3/uL (ref 1.4–7.7)
Neutrophils Relative %: 64.2 % (ref 43.0–77.0)
Platelets: 321 10*3/uL (ref 150.0–400.0)
RBC: 4.83 Mil/uL (ref 4.22–5.81)
RDW: 13.3 % (ref 11.5–15.5)
WBC: 9.1 10*3/uL (ref 4.0–10.5)

## 2021-07-13 LAB — PROTIME-INR
INR: 1 ratio (ref 0.8–1.0)
Prothrombin Time: 10.9 s (ref 9.6–13.1)

## 2021-07-13 LAB — URINALYSIS, ROUTINE W REFLEX MICROSCOPIC
Bilirubin Urine: NEGATIVE
Hgb urine dipstick: NEGATIVE
Ketones, ur: NEGATIVE
Leukocytes,Ua: NEGATIVE
Nitrite: NEGATIVE
RBC / HPF: NONE SEEN (ref 0–?)
Specific Gravity, Urine: 1.02 (ref 1.000–1.030)
Total Protein, Urine: NEGATIVE
Urine Glucose: NEGATIVE
Urobilinogen, UA: 0.2 (ref 0.0–1.0)
WBC, UA: NONE SEEN (ref 0–?)
pH: 6 (ref 5.0–8.0)

## 2021-07-20 ENCOUNTER — Other Ambulatory Visit: Payer: Self-pay | Admitting: Family Medicine

## 2021-09-12 ENCOUNTER — Encounter: Payer: Self-pay | Admitting: Gastroenterology

## 2021-09-29 ENCOUNTER — Ambulatory Visit: Payer: 59 | Admitting: Family Medicine

## 2022-02-23 LAB — HM DIABETES EYE EXAM

## 2022-04-01 ENCOUNTER — Encounter: Payer: Self-pay | Admitting: Family Medicine

## 2022-06-24 ENCOUNTER — Telehealth: Payer: Self-pay | Admitting: *Deleted

## 2022-06-24 NOTE — Telephone Encounter (Signed)
Received faxed paperwork from [facility] to be completed. Patient was notified by telephone that Dr. Lorelei Pont has not seen him in almost a year. Patient was advised that paperwork can not be completed until he is seen. Patient scheduled for an appointment with Dr. Lorelei Pont 06/29/22 at 9:40 am.

## 2022-06-28 NOTE — Progress Notes (Signed)
    Amela Handley T. Chena Chohan, MD, [place] at [facility] [place], [address]  Phone: [phone]  FAX: [place] - 56 y.o. male  MRN [id number]  Date of Birth: 01-03-1966  Date: 06/29/2022  PCP: Owens Loffler, MD  Referral: Owens Loffler, MD  No chief complaint on file.  Subjective:   Eric Frederick is a 56 y.o. very pleasant male patient with There is no height or weight on file to calculate BMI. who presents with the following:  Pleasant gentleman, and I was asked to do a preoperative evaluation on him.  He has diabetes mellitus and morbid obesity, and I have not seen him since August 2022.  At times in the past he has had poorly controlled diabetes, but his most recent hemoglobin A1c was 6.3 last year.  Plan is for total knee arthroplasty if medically stable by Dr. Maureen Ralphs.  Diabetes Mellitus: Tolerating Medications: yes He is on Trulicity 3 mg as well as Glucophage 1000 mg p.o. twice daily. Compliance with diet: fair, There is no height or weight on file to calculate BMI. Exercise: minimal / intermittent Avg blood sugars at home: not checking Foot problems: none Hypoglycemia: none No nausea, vomitting, blurred vision, polyuria.  Lab Results  Component Value Date   HGBA1C 6.3 (A) 07/12/2021   HGBA1C 8.0 04/07/2021   HGBA1C 6.8 (H) 04/01/2020   Lab Results  Component Value Date   MICROALBUR 1.7 04/01/2020   LDLCALC 110 (H) 04/01/2020   CREATININE 0.65 04/01/2020    Wt Readings from Last 3 Encounters:  07/12/21 255 lb 8 oz (115.9 kg)  04/21/21 279 lb (126.6 kg)  02/01/21 286 lb 8 oz (130 kg)    HTN: Tolerating all medications without side effects Right knee he is on lisinopril 20 mg a day Stable and at goal No CP, no sob. No HA.  BP Readings from Last 3 Encounters:  07/12/21 138/82  04/21/21 138/80  02/01/21 662/94    Basic Metabolic Panel:    Component Value Date/Time   NA  139 04/01/2020 0905   NA 137 10/29/2012 0146   K 4.5 04/01/2020 0905   K 3.4 (L) 10/29/2012 0146   CL 102 04/01/2020 0905   CL 102 10/29/2012 0146   CO2 29 04/01/2020 0905   CO2 26 10/29/2012 0146   BUN 17 04/01/2020 0905   BUN 13 10/29/2012 0146   CREATININE 0.65 04/01/2020 0905   CREATININE 0.91 10/29/2012 0146   GLUCOSE 196 (H) 04/01/2020 0905   GLUCOSE 203 (H) 10/29/2012 0146   CALCIUM 9.3 04/01/2020 0905   CALCIUM 7.9 (L) 10/29/2012 0146     Review of Systems is noted in the HPI, as appropriate  Objective:   There were no vitals taken for this visit.  GEN: No acute distress; alert,appropriate. PULM: Breathing comfortably in no respiratory distress PSYCH: Normally interactive.   Laboratory and Imaging Data:  Assessment and Plan:   ***

## 2022-06-29 ENCOUNTER — Ambulatory Visit: Payer: 59 | Admitting: Family Medicine

## 2022-06-29 ENCOUNTER — Encounter: Payer: Self-pay | Admitting: Family Medicine

## 2022-06-29 VITALS — BP 150/70 | HR 91 | Temp 98.7°F | Ht 65.5 in | Wt 256.4 lb

## 2022-06-29 DIAGNOSIS — N521 Erectile dysfunction due to diseases classified elsewhere: Secondary | ICD-10-CM

## 2022-06-29 DIAGNOSIS — Z79899 Other long term (current) drug therapy: Secondary | ICD-10-CM

## 2022-06-29 DIAGNOSIS — E7800 Pure hypercholesterolemia, unspecified: Secondary | ICD-10-CM

## 2022-06-29 DIAGNOSIS — E1159 Type 2 diabetes mellitus with other circulatory complications: Secondary | ICD-10-CM

## 2022-06-29 DIAGNOSIS — Z01818 Encounter for other preprocedural examination: Secondary | ICD-10-CM

## 2022-06-29 DIAGNOSIS — I10 Essential (primary) hypertension: Secondary | ICD-10-CM

## 2022-06-29 DIAGNOSIS — Z23 Encounter for immunization: Secondary | ICD-10-CM | POA: Diagnosis not present

## 2022-06-29 DIAGNOSIS — Z125 Encounter for screening for malignant neoplasm of prostate: Secondary | ICD-10-CM

## 2022-06-29 LAB — LIPID PANEL
Cholesterol: 143 mg/dL (ref 0–200)
HDL: 46.3 mg/dL (ref >39.00)
LDL Cholesterol: 86 mg/dL (ref 0–99)
NonHDL: 96.28
Total CHOL/HDL Ratio: 3
Triglycerides: 51 mg/dL (ref 0.0–149.0)
VLDL: 10.2 mg/dL (ref 0.0–40.0)

## 2022-06-29 LAB — CBC WITH DIFFERENTIAL/PLATELET
Basophils Absolute: 0.1 10*3/uL (ref 0.0–0.1)
Basophils Relative: 0.8 % (ref 0.0–3.0)
Eosinophils Absolute: 0.4 10*3/uL (ref 0.0–0.7)
Eosinophils Relative: 5.7 % — ABNORMAL HIGH (ref 0.0–5.0)
HCT: 43 % (ref 39.0–52.0)
Hemoglobin: 14.1 g/dL (ref 13.0–17.0)
Lymphocytes Relative: 24.8 % (ref 12.0–46.0)
Lymphs Abs: 1.7 10*3/uL (ref 0.7–4.0)
MCHC: 32.9 g/dL (ref 30.0–36.0)
MCV: 89.6 fl (ref 78.0–100.0)
Monocytes Absolute: 0.7 10*3/uL (ref 0.1–1.0)
Monocytes Relative: 10 % (ref 3.0–12.0)
Neutro Abs: 4 10*3/uL (ref 1.4–7.7)
Neutrophils Relative %: 58.7 % (ref 43.0–77.0)
Platelets: 277 10*3/uL (ref 150.0–400.0)
RBC: 4.8 Mil/uL (ref 4.22–5.81)
RDW: 13.3 % (ref 11.5–15.5)
WBC: 6.8 10*3/uL (ref 4.0–10.5)

## 2022-06-29 LAB — BASIC METABOLIC PANEL
BUN: 18 mg/dL (ref 6–23)
CO2: 28 mEq/L (ref 19–32)
Calcium: 9.3 mg/dL (ref 8.4–10.5)
Chloride: 102 mEq/L (ref 96–112)
Creatinine, Ser: 0.53 mg/dL (ref 0.40–1.50)
GFR: 112.26 mL/min (ref >60.00)
Glucose, Bld: 104 mg/dL — ABNORMAL HIGH (ref 70–99)
Potassium: 3.8 mEq/L (ref 3.5–5.1)
Sodium: 139 mEq/L (ref 135–145)

## 2022-06-29 LAB — HEPATIC FUNCTION PANEL
ALT: 18 U/L (ref 0–53)
AST: 14 U/L (ref 0–37)
Albumin: 4.3 g/dL (ref 3.5–5.2)
Alkaline Phosphatase: 98 U/L (ref 39–117)
Bilirubin, Direct: 0.1 mg/dL (ref 0.0–0.3)
Total Bilirubin: 0.5 mg/dL (ref 0.2–1.2)
Total Protein: 6.4 g/dL (ref 6.0–8.3)

## 2022-06-29 LAB — POCT GLYCOSYLATED HEMOGLOBIN (HGB A1C): Hemoglobin A1C: 6.1 % — AB (ref 4.0–5.6)

## 2022-07-01 LAB — PSA, TOTAL WITH REFLEX TO PSA, FREE: PSA, Total: 0.8 ng/mL (ref <=4.0)

## 2023-01-11 ENCOUNTER — Encounter: Payer: Self-pay | Admitting: Family Medicine

## 2023-01-11 ENCOUNTER — Ambulatory Visit: Payer: 59 | Admitting: Family Medicine

## 2023-01-11 VITALS — BP 144/78 | HR 119 | Temp 97.3°F | Ht 65.5 in | Wt 273.2 lb

## 2023-01-11 DIAGNOSIS — N521 Erectile dysfunction due to diseases classified elsewhere: Secondary | ICD-10-CM

## 2023-01-11 DIAGNOSIS — I10 Essential (primary) hypertension: Secondary | ICD-10-CM | POA: Diagnosis not present

## 2023-01-11 DIAGNOSIS — Z23 Encounter for immunization: Secondary | ICD-10-CM | POA: Diagnosis not present

## 2023-01-11 DIAGNOSIS — E7800 Pure hypercholesterolemia, unspecified: Secondary | ICD-10-CM | POA: Diagnosis not present

## 2023-01-11 DIAGNOSIS — E1159 Type 2 diabetes mellitus with other circulatory complications: Secondary | ICD-10-CM

## 2023-01-11 LAB — POCT GLYCOSYLATED HEMOGLOBIN (HGB A1C): Hemoglobin A1C: 7.8 % — AB (ref 4.0–5.6)

## 2023-01-11 MED ORDER — SEMAGLUTIDE (1 MG/DOSE) 4 MG/3ML ~~LOC~~ SOPN: 1.0000 mg | PEN_INJECTOR | SUBCUTANEOUS | 0 refills | Status: DC

## 2023-01-11 MED ORDER — METFORMIN HCL 1000 MG PO TABS
1000.0000 mg | ORAL_TABLET | Freq: Two times a day (BID) | ORAL | 3 refills | Status: DC
Start: 2023-01-11 — End: 2023-11-22

## 2023-01-11 MED ORDER — SEMAGLUTIDE (2 MG/DOSE) 8 MG/3ML ~~LOC~~ SOPN: 2.0000 mg | PEN_INJECTOR | SUBCUTANEOUS | 5 refills | Status: DC

## 2023-01-11 MED ORDER — LISINOPRIL 40 MG PO TABS: 40.0000 mg | ORAL_TABLET | Freq: Every day | ORAL | 3 refills | Status: DC

## 2023-01-11 MED ORDER — OMEPRAZOLE 40 MG PO CPDR: 40.0000 mg | DELAYED_RELEASE_CAPSULE | Freq: Every day | ORAL | 3 refills | Status: DC

## 2023-01-11 MED ORDER — OZEMPIC (0.25 OR 0.5 MG/DOSE) 2 MG/3ML ~~LOC~~ SOPN: 0.2500 mg | PEN_INJECTOR | SUBCUTANEOUS | 1 refills | Status: DC

## 2023-01-11 NOTE — Progress Notes (Signed)
Xianna Siverling T. Melinna Linarez, MD, [place] at [facility] [place], [address]  Phone: [phone]  FAX: [place] - 57 y.o. male  MRN [id number]  Date of Birth: 1966-03-26  Date: 01/11/2023  PCP: Owens Loffler, MD  Referral: Owens Loffler, MD  Chief Complaint  Patient presents with   Diabetes   Subjective:   Eric Frederick is a 57 y.o. very pleasant male patient with Body mass index is 44.78 kg/m. who presents with the following:  He is a well-known patient for many years, he presents today with some ongoing follow-up for diabetes, hypertension, and hyperlipidemia.  Diabetes Mellitus: Tolerating Medications: yes.  While he has been taking his metformin, he did stop his Ozempic.  He had been having excellent blood sugar control while he was on Ozempic. Compliance with diet: fair, Body mass index is 44.78 kg/m. Exercise: minimal / intermittent Avg blood sugars at home: not checking Foot problems: none Hypoglycemia: none No nausea, vomitting, blurred vision, polyuria.  A1c is 7.8  Lab Results  Component Value Date   HGBA1C 7.8 (A) 01/11/2023   HGBA1C 6.1 (A) 06/29/2022   HGBA1C 6.3 (A) 07/12/2021   Lab Results  Component Value Date   MICROALBUR 1.7 04/01/2020   LDLCALC 86 06/29/2022   CREATININE 0.53 06/29/2022    Wt Readings from Last 3 Encounters:  01/11/23 273 lb 4 oz (123.9 kg)  06/29/22 256 lb 6 oz (116.3 kg)  07/12/21 255 lb 8 oz (115.9 kg)    HTN: Tolerating all medications without side effects Stable and at goal No CP, no sob. No HA.  BP Readings from Last 3 Encounters:  01/11/23 (!) 144/78  06/29/22 (!) 150/70  07/12/21 138/82   Lipids: Doing well, stable. Tolerating meds fine with no SE. Panel reviewed with patient.  Lipids: Lab Results  Component Value Date   CHOL 143 06/29/2022   Lab Results  Component Value Date   HDL 46.30 06/29/2022   Lab Results   Component Value Date   LDLCALC 86 06/29/2022   Lab Results  Component Value Date   TRIG 51.0 06/29/2022   Lab Results  Component Value Date   CHOLHDL 3 06/29/2022    Lab Results  Component Value Date   ALT 18 06/29/2022   AST 14 06/29/2022   ALKPHOS 98 06/29/2022   BILITOT 0.5 06/29/2022     Review of Systems is noted in the HPI, as appropriate  Objective:   BP (!) 144/78   Pulse (!) 119   Temp (!) 97.3 F (36.3 C) (Temporal)   Ht 5' 5.5" (1.664 m)   Wt 273 lb 4 oz (123.9 kg)   SpO2 95%   BMI 44.78 kg/m   GEN: No acute distress; alert,appropriate. PULM: Breathing comfortably in no respiratory distress PSYCH: Normally interactive.  CV: RRR, no m/g/r  PULM: Normal respiratory rate, no accessory muscle use. No wheezes, crackles or rhonchi   Laboratory and Imaging Data: Results for orders placed or performed in visit on 01/11/23  Microalbumin / creatinine urine ratio  Result Value Ref Range   Microalb, Ur 7.5 (H) 0.0 - 1.9 mg/dL   Creatinine,U 116.2 mg/dL   Microalb Creat Ratio 6.5 0.0 - 30.0 mg/g  POCT glycosylated hemoglobin (Hb A1C)  Result Value Ref Range   Hemoglobin A1C 7.8 (A) 4.0 - 5.6 %   HbA1c POC (<> result, manual entry)     HbA1c, POC (prediabetic range)  HbA1c, POC (controlled diabetic range)       Assessment and Plan:     ICD-10-CM   1. Type 2 diabetes with circulatory disorder causing erectile dysfunction (HCC)  E11.59 POCT glycosylated hemoglobin (Hb A1C)   N52.1 Microalbumin / creatinine urine ratio    2. Essential hypertension  I10     3. HYPERCHOLESTEROLEMIA  E78.00     4. Severe obesity (BMI >= 40) (HCC)  E66.01     5. Need for influenza vaccination  Z23 Flu Vaccine QUAD 6+ mos PF IM (Fluarix Quad PF)     Uncontrolled type 2 diabetes.  He had previously been doing well on Ozempic 2 mg, so we will have to restart him on the titration progress.  Uncontrolled hypertension.  Right now, he does not want to go on any additional  medication, and I think that that is reasonable expecting weight loss with his Ozempic.  At close follow-up if his blood pressures continue to be high, then I would start an additional agent.  Medication Management during today's office visit: Meds ordered this encounter  Medications   lisinopril (ZESTRIL) 40 MG tablet    Sig: Take 1 tablet (40 mg total) by mouth daily.    Dispense:  90 tablet    Refill:  3   metFORMIN (GLUCOPHAGE) 1000 MG tablet    Sig: Take 1 tablet (1,000 mg total) by mouth 2 (two) times daily.    Dispense:  180 tablet    Refill:  3   omeprazole (PRILOSEC) 40 MG capsule    Sig: Take 1 capsule (40 mg total) by mouth daily.    Dispense:  90 capsule    Refill:  3   Semaglutide,0.25 or 0.'5MG'$ /DOS, (OZEMPIC, 0.25 OR 0.5 MG/DOSE,) 2 MG/3ML SOPN    Sig: Inject 0.25 mg into the skin once a week. Increase to 0.5 mg after 1 month    Dispense:  3 mL    Refill:  1   Semaglutide, 1 MG/DOSE, 4 MG/3ML SOPN    Sig: Inject 1 mg as directed once a week.    Dispense:  3 mL    Refill:  0    2nd refill   Semaglutide, 2 MG/DOSE, 8 MG/3ML SOPN    Sig: Inject 2 mg as directed once a week.    Dispense:  3 mL    Refill:  5    3rd refill on titration to 2 mg   Medications Discontinued During This Encounter  Medication Reason   OZEMPIC, 1 MG/DOSE, 4 MG/3ML SOPN Change in therapy   OZEMPIC, 2 MG/DOSE, 8 MG/3ML SOPN    omeprazole (PRILOSEC) 40 MG capsule Reorder   metFORMIN (GLUCOPHAGE) 1000 MG tablet Reorder   lisinopril (ZESTRIL) 40 MG tablet Reorder    Orders placed today for conditions managed today: Orders Placed This Encounter  Procedures   Flu Vaccine QUAD 6+ mos PF IM (Fluarix Quad PF)   Microalbumin / creatinine urine ratio   POCT glycosylated hemoglobin (Hb A1C)    Disposition: Return in about 4 months (around 05/12/2023) for follow-up with Dr. Lorelei Pont diabetes and hypertension.  Dragon [employer] One speech-to-text software was used for transcription in this dictation.   Possible transcriptional errors can occur using Editor, commissioning.   Signed,  Maud Deed. Angelyna Henderson, MD   Outpatient Encounter Medications as of 01/11/2023  Medication Sig   Semaglutide, 1 MG/DOSE, 4 MG/3ML SOPN Inject 1 mg as directed once a week.   Semaglutide, 2 MG/DOSE, 8 MG/3ML SOPN Inject  2 mg as directed once a week.   Semaglutide,0.25 or 0.'5MG'$ /DOS, (OZEMPIC, 0.25 OR 0.5 MG/DOSE,) 2 MG/3ML SOPN Inject 0.25 mg into the skin once a week. Increase to 0.5 mg after 1 month   [DISCONTINUED] lisinopril (ZESTRIL) 40 MG tablet Take 40 mg by mouth daily.   [DISCONTINUED] metFORMIN (GLUCOPHAGE) 1000 MG tablet Take 1 tablet (1,000 mg total) by mouth 2 (two) times daily.   [DISCONTINUED] omeprazole (PRILOSEC) 40 MG capsule Take 1 capsule (40 mg total) by mouth daily.   [DISCONTINUED] OZEMPIC, 2 MG/DOSE, 8 MG/3ML SOPN Inject 1 mg into the skin once a week.   [DISCONTINUED] sildenafil (VIAGRA) 100 MG tablet Take 0.5-1 tablets (50-100 mg total) by mouth daily as needed for erectile dysfunction.   lisinopril (ZESTRIL) 40 MG tablet Take 1 tablet (40 mg total) by mouth daily.   metFORMIN (GLUCOPHAGE) 1000 MG tablet Take 1 tablet (1,000 mg total) by mouth 2 (two) times daily.   omeprazole (PRILOSEC) 40 MG capsule Take 1 capsule (40 mg total) by mouth daily.   [DISCONTINUED] OZEMPIC, 1 MG/DOSE, 4 MG/3ML SOPN Inject into the skin.   No facility-administered encounter medications on file as of 01/11/2023.

## 2023-01-11 NOTE — Patient Instructions (Signed)
Ozempic  Start 0.25 mg injection each week for 4 weeks  Then do 0.5 mg injection each week for 4 weeks  Then do 1 mg injection each week for 4 weeks  Then increase to final dose of 2 mg injection each week

## 2023-01-12 ENCOUNTER — Telehealth: Payer: Self-pay | Admitting: Family Medicine

## 2023-01-12 ENCOUNTER — Other Ambulatory Visit (HOSPITAL_COMMUNITY): Payer: Self-pay

## 2023-01-12 LAB — MICROALBUMIN / CREATININE URINE RATIO
Creatinine,U: 116.2 mg/dL
Microalb Creat Ratio: 6.5 mg/g (ref 0.0–30.0)
Microalb, Ur: 7.5 mg/dL — ABNORMAL HIGH (ref 0.0–1.9)

## 2023-01-12 MED ORDER — SILDENAFIL CITRATE 100 MG PO TABS: 50.0000 mg | ORAL_TABLET | Freq: Every day | ORAL | 11 refills | Status: DC | PRN

## 2023-01-12 NOTE — Telephone Encounter (Signed)
Spoke with Eric Frederick.  He states his insurance in requiring PA for the [place].  Will forward to PA Team to complete that.  Patient picked up Lisinopril and Metformin today.  Omeprazole is too soon to refill per pharmacy.  He can pick that up tomorrow.  Sildenafil refill sent in today.

## 2023-01-12 NOTE — Telephone Encounter (Signed)
Patient called and stated that [place] faxed over a form why he need a medication and as well some of the medications are not at the pharmacy and patient didn't state which ones and requested a call back from [facility]. Call back number [phone].

## 2023-01-13 ENCOUNTER — Encounter: Payer: Self-pay | Admitting: Family Medicine

## 2023-01-18 ENCOUNTER — Other Ambulatory Visit (HOSPITAL_COMMUNITY): Payer: Self-pay

## 2023-01-18 NOTE — Telephone Encounter (Signed)
Patient Advocate Encounter   Received notification from [employer] that prior authorization for Ozempic is required.   PA submitted on 01/18/2023 Key [id number] Status is pending

## 2023-01-18 NOTE — Telephone Encounter (Signed)
Noted  

## 2023-05-23 NOTE — Progress Notes (Unsigned)
    Yee Gangi T. Ronav Furney, MD, [facility] Sports [facility] at [facility] [address] [place], [address]  Phone: [phone]  FAX: [phone]  Eric Frederick - 57 y.o. male  MRN [id number]  Date of Birth: March 10, 1966  Date: 05/24/2023  PCP: Hannah Beat, MD  Referral: Hannah Beat, MD  No chief complaint on file.  Subjective:   Eric Frederick is a 57 y.o. very pleasant male patient with There is no height or weight on file to calculate BMI. who presents with the following:  Eric Frederick is a very well-known patient who presents today for follow-up on his diabetes and high blood pressure.  Diabetes Mellitus: Tolerating Medications: yes He is currently on metformin 1000 mg p.o. twice daily as well as Ozempic 2 mg. Compliance with diet: fair, There is no height or weight on file to calculate BMI. Exercise: minimal / intermittent Avg blood sugars at home: not checking Foot problems: none Hypoglycemia: none No nausea, vomitting, blurred vision, polyuria.  Lab Results  Component Value Date   HGBA1C 7.8 (A) 01/11/2023   HGBA1C 6.1 (A) 06/29/2022   HGBA1C 6.3 (A) 07/12/2021   Lab Results  Component Value Date   MICROALBUR 7.5 (H) 01/11/2023   LDLCALC 86 06/29/2022   CREATININE 0.53 06/29/2022    Wt Readings from Last 3 Encounters:  01/11/23 273 lb 4 oz (123.9 kg)  06/29/22 256 lb 6 oz (116.3 kg)  07/12/21 255 lb 8 oz (115.9 kg)    HTN: Tolerating all medications without side effects -he is on lisinopril 40 mg. Stable and at goal No CP, no sob. No HA.  BP Readings from Last 3 Encounters:  01/11/23 (!) 144/78  06/29/22 (!) 150/70  07/12/21 138/82    Basic Metabolic Panel:    Component Value Date/Time   NA 139 06/29/2022 1025   NA 137 10/29/2012 0146   K 3.8 06/29/2022 1025   K 3.4 (L) 10/29/2012 0146   CL 102 06/29/2022 1025   CL 102 10/29/2012 0146   CO2 28 06/29/2022 1025   CO2 26 10/29/2012 0146   BUN 18 06/29/2022 1025    BUN 13 10/29/2012 0146   CREATININE 0.53 06/29/2022 1025   CREATININE 0.91 10/29/2012 0146   GLUCOSE 104 (H) 06/29/2022 1025   GLUCOSE 203 (H) 10/29/2012 0146   CALCIUM 9.3 06/29/2022 1025   CALCIUM 7.9 (L) 10/29/2012 0146     Review of Systems is noted in the HPI, as appropriate  Objective:   There were no vitals taken for this visit.  GEN: No acute distress; alert,appropriate. PULM: Breathing comfortably in no respiratory distress PSYCH: Normally interactive.   Laboratory and Imaging Data:  Assessment and Plan:   ***

## 2023-05-24 ENCOUNTER — Encounter: Payer: Self-pay | Admitting: Family Medicine

## 2023-05-24 ENCOUNTER — Ambulatory Visit: Payer: 59 | Admitting: Family Medicine

## 2023-05-24 VITALS — BP 120/60 | HR 97 | Temp 97.6°F | Ht 65.5 in | Wt 266.0 lb

## 2023-05-24 DIAGNOSIS — N521 Erectile dysfunction due to diseases classified elsewhere: Secondary | ICD-10-CM | POA: Diagnosis not present

## 2023-05-24 DIAGNOSIS — Z23 Encounter for immunization: Secondary | ICD-10-CM | POA: Diagnosis not present

## 2023-05-24 DIAGNOSIS — E1159 Type 2 diabetes mellitus with other circulatory complications: Secondary | ICD-10-CM | POA: Diagnosis not present

## 2023-05-24 DIAGNOSIS — Z7985 Long-term (current) use of injectable non-insulin antidiabetic drugs: Secondary | ICD-10-CM

## 2023-05-24 DIAGNOSIS — Z7984 Long term (current) use of oral hypoglycemic drugs: Secondary | ICD-10-CM | POA: Diagnosis not present

## 2023-05-24 DIAGNOSIS — I10 Essential (primary) hypertension: Secondary | ICD-10-CM

## 2023-05-24 LAB — POCT GLYCOSYLATED HEMOGLOBIN (HGB A1C): Hemoglobin A1C: 5.9 % — AB (ref 4.0–5.6)

## 2023-05-24 MED ORDER — SEMAGLUTIDE (2 MG/DOSE) 8 MG/3ML ~~LOC~~ SOPN: 2.0000 mg | PEN_INJECTOR | SUBCUTANEOUS | 3 refills | Status: DC

## 2023-10-13 ENCOUNTER — Ambulatory Visit
Admission: EM | Admit: 2023-10-13 | Discharge: 2023-10-13 | Disposition: A | Discharge status: Home / Self Care | Payer: 59 | Attending: Emergency Medicine | Admitting: Emergency Medicine

## 2023-10-13 DIAGNOSIS — U071 COVID-19: Secondary | ICD-10-CM

## 2023-10-13 LAB — POC COVID19/FLU A&B COMBO
Covid Antigen, POC: POSITIVE — AB
Influenza A Antigen, POC: NEGATIVE
Influenza B Antigen, POC: NEGATIVE

## 2023-10-13 MED ORDER — MOLNUPIRAVIR EUA 200MG CAPSULE: 4.0000 | ORAL_CAPSULE | Freq: Two times a day (BID) | ORAL | 0 refills | Status: AC

## 2023-10-13 NOTE — ED Provider Notes (Signed)
Eric Frederick    CSN: [id number] Arrival date & time: 10/13/23  1884      History   Chief Complaint Chief Complaint  Patient presents with   Cough   Nasal Congestion    HPI Eric Frederick is a 57 y.o. male.  Patient presents with 3-day history of chills, body aches, headache, congestion, cough.  Treating with Mucinex.  No fever, shortness of breath, or other symptoms.  His medical history includes diabetes, hypertension, obesity, diverticulitis, GERD.  The history is provided by the patient and medical records.    Past Medical History:  Diagnosis Date   Diabetes mellitus    Diverticulosis    GERD (gastroesophageal reflux disease)    Hiatal hernia 04/22/2015   Hyperlipidemia    Hypertension    Serrated adenoma of colon 06/2016    Patient Active Problem List   Diagnosis Date Noted   Severe bilateral knee arthritis 04/14/2020   Hiatal hernia 04/22/2015   Severe obesity (BMI >= 40) (HCC) 03/12/2014   Erectile dysfunction associated with type 2 diabetes mellitus (HCC) 03/02/2011   Type 2 diabetes with circulatory disorder causing erectile dysfunction (HCC) 07/21/2010   HYPERCHOLESTEROLEMIA 07/21/2010   Essential hypertension 07/21/2010    Past Surgical History:  Procedure Laterality Date   SHOULDER ARTHROSCOPY W/ ROTATOR CUFF REPAIR Right 2014   [place]       Home Medications    Prior to Admission medications   Medication Sig Start Date End Date Taking? Authorizing Provider  molnupiravir EUA (LAGEVRIO) 200 mg CAPS capsule Take 4 capsules (800 mg total) by mouth 2 (two) times daily for 5 days. 10/13/23 10/18/23 Yes Mickie Bail, NP  lisinopril (ZESTRIL) 40 MG tablet Take 1 tablet (40 mg total) by mouth daily. 01/11/23   Copland, Karleen Hampshire, MD  metFORMIN (GLUCOPHAGE) 1000 MG tablet Take 1 tablet (1,000 mg total) by mouth 2 (two) times daily. 01/11/23   Copland, Karleen Hampshire, MD  omeprazole (PRILOSEC) 40 MG capsule Take 1 capsule (40 mg total) by mouth daily.  01/11/23   Copland, Karleen Hampshire, MD  Semaglutide, 2 MG/DOSE, 8 MG/3ML SOPN Inject 2 mg as directed once a week. 05/24/23   Copland, Karleen Hampshire, MD  sildenafil (VIAGRA) 100 MG tablet Take 0.5-1 tablets (50-100 mg total) by mouth daily as needed for erectile dysfunction. 01/12/23   Copland, Karleen Hampshire, MD    Family History Family History  Problem Relation Age of Onset   Other Father        complications from agent orange   Colon cancer Neg Hx     Social History Social History   Tobacco Use   Smoking status: Former    Current packs/day: 0.00    Types: Cigarettes    Quit date: 03/02/1991    Years since quitting: 32.6   Smokeless tobacco: Never  Vaping Use   Vaping status: Never Used  Substance Use Topics   Alcohol use: No    Alcohol/week: 0.0 standard drinks of alcohol   Drug use: No     Allergies   Glipizide   Review of Systems Review of Systems  Constitutional:  Positive for chills. Negative for fever.  HENT:  Positive for congestion, postnasal drip, rhinorrhea and sneezing. Negative for ear pain and sore throat.   Respiratory:  Positive for cough. Negative for shortness of breath.   Cardiovascular:  Negative for chest pain and palpitations.     Physical Exam Triage Vital Signs ED Triage Vitals [10/13/23 0917]  Encounter Vitals Group  BP      Systolic BP Percentile      Diastolic BP Percentile      Pulse Rate (!) 105     Resp 18     Temp 98.4 F (36.9 C)     Temp src      SpO2 97 %     Weight      Height      Head Circumference      Peak Flow      Pain Score      Pain Loc      Pain Education      Exclude from Growth Chart    No data found.  Updated Vital Signs BP 137/79   Pulse (!) 105   Temp 98.4 F (36.9 C)   Resp 18   SpO2 97%   Visual Acuity Right Eye Distance:   Left Eye Distance:   Bilateral Distance:    Right Eye Near:   Left Eye Near:    Bilateral Near:     Physical Exam Vitals and nursing note reviewed.  Constitutional:      General:  He is not in acute distress.    Appearance: He is well-developed.  Cardiovascular:     Rate and Rhythm: Normal rate and regular rhythm.     Heart sounds: Normal heart sounds.  Pulmonary:     Effort: Pulmonary effort is normal. No respiratory distress.     Breath sounds: Normal breath sounds.  Musculoskeletal:     Cervical back: Neck supple.  Skin:    General: Skin is warm and dry.  Neurological:     Mental Status: He is alert.      UC Treatments / Results  Labs (all labs ordered are listed, but only abnormal results are displayed) Labs Reviewed  POC COVID19/FLU A&B COMBO - Abnormal; Notable for the following components:      Result Value   Covid Antigen, POC Positive (*)    All other components within normal limits    EKG   Radiology No results found.  Procedures Procedures (including critical care time)  Medications Ordered in UC Medications - No data to display  Initial Impression / Assessment and Plan / UC Course  I have reviewed the triage vital signs and the nursing notes.  Pertinent labs & imaging results that were available during my care of the patient were reviewed by me and considered in my medical decision making (see chart for details).    COVID-19.  Rapid COVID positive.  Discussed treatment options.  Treating with molnupiravir.  Discussed that this is an emergency authorized medication for treatment of COVID.  Discussed side effects including nausea, diarrhea, dizziness.  Discussed other symptomatic treatment including Tylenol, rest, hydration.  Instructed patient to follow-up with his PCP on Monday.  ED precautions discussed.  Patient agrees to plan of care.   Final Clinical Impressions(s) / UC Diagnoses   Final diagnoses:  COVID-19     Discharge Instructions      Take the molnupiravir as directed.    Follow up with your primary care provider tomorrow.  Go to the emergency department if you have worsening symptoms.        ED  Prescriptions     Medication Sig Dispense Auth. Provider   molnupiravir EUA (LAGEVRIO) 200 mg CAPS capsule Take 4 capsules (800 mg total) by mouth 2 (two) times daily for 5 days. 40 capsule Mickie Bail, NP      PDMP not reviewed  this encounter.   Mickie Bail, NP 10/13/23 [phone]

## 2023-10-13 NOTE — ED Triage Notes (Signed)
Patient to Urgent Care with complaints of cough/ nasal congestion/ sneezing/ generalized body aches/ headaches. Chills and hot flashes.   Reports symptoms started four days ago. Wife sick with similar symptoms. Recent travel.   Taking Mucinex.

## 2023-10-13 NOTE — Discharge Instructions (Signed)
Take the molnupiravir as directed.    Follow up with your primary care provider tomorrow.  Go to the emergency department if you have worsening symptoms.

## 2023-10-21 ENCOUNTER — Ambulatory Visit: Payer: 59

## 2023-10-21 ENCOUNTER — Encounter: Payer: Self-pay | Admitting: *Deleted

## 2023-10-21 ENCOUNTER — Ambulatory Visit
Admission: EM | Admit: 2023-10-21 | Discharge: 2023-10-21 | Disposition: A | Discharge status: Home / Self Care | Payer: 59 | Attending: Emergency Medicine | Admitting: Emergency Medicine

## 2023-10-21 DIAGNOSIS — S81012A Laceration without foreign body, left knee, initial encounter: Secondary | ICD-10-CM

## 2023-10-21 DIAGNOSIS — R051 Acute cough: Secondary | ICD-10-CM | POA: Diagnosis not present

## 2023-10-21 MED ORDER — BENZONATATE 100 MG PO CAPS: 100.0000 mg | ORAL_CAPSULE | Freq: Three times a day (TID) | ORAL | 0 refills | Status: DC

## 2023-10-21 MED ORDER — PREDNISONE 20 MG PO TABS: 40.0000 mg | ORAL_TABLET | Freq: Every day | ORAL | 0 refills | Status: DC

## 2023-10-21 MED ORDER — PROMETHAZINE-DM 6.25-15 MG/5ML PO SYRP: 5.0000 mL | ORAL_SOLUTION | Freq: Every evening | ORAL | 0 refills | Status: DC | PRN

## 2023-10-21 NOTE — Discharge Instructions (Addendum)
Your evaluated for the laceration to your left knee  Due to the amount of bleeding x-ray has been completed, you will be notified of results via telephone either this afternoon or first thing in the morning  Will to control bleeding with manual pressure and a small amount of epinephrine and lidocaine  3 sutures please return in about 14 days for removal  Up-to-date your tetanus therefore will not need today, last documented June 2024  Cleanse wound and apply compression dressing May remove prior to bed this evening  Cleanse daily with unscented soap and water, pat and do not rub, may leave open to air but if at risk for becoming dirty or friction from clothing please apply a nonstick Band-Aid  May return for any increased swelling, pus, increased pain or redness as these are signs of infection
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
## 2023-10-22 ENCOUNTER — Telehealth: Payer: Self-pay | Admitting: Emergency Medicine

## 2023-10-22 NOTE — Telephone Encounter (Signed)
Reported x-ray results via telephone, 2 patient identifiers used, will continue treatment plan as discussed

## 2023-10-22 NOTE — ED Provider Notes (Signed)
Eric Frederick    CSN: [id number] Arrival date & time: 10/21/23  1343      History   Chief Complaint Chief Complaint  Patient presents with   Extremity Laceration    HPI Eric Frederick is a 57 y.o. male.   Patient presents for evaluation of a laceration to the left knee that occurred yesterday evening from an electric hedge tremor.  Has cleansed with soap water and H2O2.  Concerned as the area has continued to profusely bleed since occurring.  Bear weight and to complete range of motion.  History of the knee replacement to the left side.   Patient concerned with persisting nonproductive cough that began 9 days ago with COVID-19 infection.  All other symptoms initially occurring have resolved.  Cough is worse at nighttime, interfering with sleep.  Believes he is intermittently wheezing.  Denies shortness of breath.  Past Medical History:  Diagnosis Date   Diabetes mellitus    Diverticulosis    GERD (gastroesophageal reflux disease)    Hiatal hernia 04/22/2015   Hyperlipidemia    Hypertension    Serrated adenoma of colon 06/2016    Patient Active Problem List   Diagnosis Date Noted   Severe bilateral knee arthritis 04/14/2020   Hiatal hernia 04/22/2015   Severe obesity (BMI >= 40) (HCC) 03/12/2014   Erectile dysfunction associated with type 2 diabetes mellitus (HCC) 03/02/2011   Type 2 diabetes with circulatory disorder causing erectile dysfunction (HCC) 07/21/2010   HYPERCHOLESTEROLEMIA 07/21/2010   Essential hypertension 07/21/2010    Past Surgical History:  Procedure Laterality Date   SHOULDER ARTHROSCOPY W/ ROTATOR CUFF REPAIR Right 2014   [facility]       Home Medications    Prior to Admission medications   Medication Sig Start Date End Date Taking? Authorizing Provider  benzonatate (TESSALON) 100 MG capsule Take 1 capsule (100 mg total) by mouth every 8 (eight) hours. 10/21/23  Yes Malisha Mabey R, NP  lisinopril (ZESTRIL) 40 MG tablet Take 1  tablet (40 mg total) by mouth daily. 01/11/23  Yes Copland, Karleen Hampshire, MD  metFORMIN (GLUCOPHAGE) 1000 MG tablet Take 1 tablet (1,000 mg total) by mouth 2 (two) times daily. 01/11/23  Yes Copland, Karleen Hampshire, MD  omeprazole (PRILOSEC) 40 MG capsule Take 1 capsule (40 mg total) by mouth daily. 01/11/23  Yes Copland, Karleen Hampshire, MD  predniSONE (DELTASONE) 20 MG tablet Take 2 tablets (40 mg total) by mouth daily. 10/21/23  Yes Thekla Colborn R, NP  promethazine-dextromethorphan (PROMETHAZINE-DM) 6.25-15 MG/5ML syrup Take 5 mLs by mouth at bedtime as needed for cough. 10/21/23  Yes Hodan Wurtz R, NP  Semaglutide, 2 MG/DOSE, 8 MG/3ML SOPN Inject 2 mg as directed once a week. 05/24/23  Yes Copland, Karleen Hampshire, MD  sildenafil (VIAGRA) 100 MG tablet Take 0.5-1 tablets (50-100 mg total) by mouth daily as needed for erectile dysfunction. 01/12/23   Copland, Karleen Hampshire, MD    Family History Family History  Problem Relation Age of Onset   Other Father        complications from agent orange   Colon cancer Neg Hx     Social History Social History   Tobacco Use   Smoking status: Former    Current packs/day: 0.00    Types: Cigarettes    Quit date: 03/02/1991    Years since quitting: 32.6   Smokeless tobacco: Never  Vaping Use   Vaping status: Never Used  Substance Use Topics   Alcohol use: No    Alcohol/week: 0.0 standard  drinks of alcohol   Drug use: No     Allergies   Glipizide   Review of Systems Review of Systems   Physical Exam Triage Vital Signs ED Triage Vitals  Encounter Vitals Group     BP 10/21/23 1351 (!) 179/89     Systolic BP Percentile --      Diastolic BP Percentile --      Pulse Rate 10/21/23 1351 (!) 107     Resp 10/21/23 1351 20     Temp 10/21/23 1351 99.3 F (37.4 C)     Temp Source 10/21/23 1351 Oral     SpO2 10/21/23 1351 92 %     Weight --      Height --      Head Circumference --      Peak Flow --      Pain Score 10/21/23 1354 3     Pain Loc --      Pain Education  --      Exclude from Growth Chart --    No data found.  Updated Vital Signs BP (!) 179/89   Pulse (!) 107   Temp 99.3 F (37.4 C) (Oral)   Resp 20   SpO2 92%   Visual Acuity Right Eye Distance:   Left Eye Distance:   Bilateral Distance:    Right Eye Near:   Left Eye Near:    Bilateral Near:     Physical Exam Constitutional:      Appearance: Normal appearance.  HENT:     Nose: Congestion present. No rhinorrhea.  Eyes:     Extraocular Movements: Extraocular movements intact.  Cardiovascular:     Rate and Rhythm: Normal rate and regular rhythm.     Pulses: Normal pulses.     Heart sounds: Normal heart sounds.  Pulmonary:     Effort: Pulmonary effort is normal.     Breath sounds: Normal breath sounds.  Skin:    Comments: 1 cm laceration present to the center of the left knee, profusely bleeding, has saturated 1 pack of guaze, 2+ popliteal, moderate swelling to the anterior left knee, no ecchymosis or deformity, able to complete range of motion and bear weight  Neurological:     Mental Status: He is alert and oriented to person, place, and time. Mental status is at baseline.      UC Treatments / Results  Labs (all labs ordered are listed, but only abnormal results are displayed) Labs Reviewed - No data to display  EKG   Radiology DG Knee Complete 4 Views Left  Result Date: 10/21/2023 CLINICAL DATA:  Knee laceration 1 day ago. EXAM: LEFT KNEE - COMPLETE 4+ VIEW COMPARISON:  Knee radiographs dated 04/13/2020. FINDINGS: The patient is status post knee arthroplasty. There is no knee joint dislocation or significant knee joint effusion. There is soft tissue swelling anterior to the knee. No radiopaque foreign body in the anterior knee. IMPRESSION: No acute osseous injury. Electronically Signed   By: Romona Curls M.D.   On: 10/21/2023 15:37    Procedures Procedures (including critical care time)  Medications Ordered in UC Medications - No data to display  Initial  Impression / Assessment and Plan / UC Course  I have reviewed the triage vital signs and the nursing notes.  Pertinent labs & imaging results that were available during my care of the patient were reviewed by me and considered in my medical decision making (see chart for details).  Laceration of left knee, initial encounter,  acute cough  3 sutures placed advise removal 2 weeks, able to control bleeding with lidocaine epinephrine and manual pressure, up-to-date on tetanus, last known June 2024.  Advised daily cleansing and monitoring for signs of infection, to follow-up for any concerns regarding healing  Cough most likely residual for recent viral illness, COVID 9 days ago, lungs are clear therefore deferring imaging low suspicion for pneumonia or bronchitis, prescribed prednisone, Tessalon and Promethazine DM and advised follow-up if symptoms continue to persist or worsen Final Clinical Impressions(s) / UC Diagnoses   Final diagnoses:  Laceration of left knee, initial encounter  Acute cough     Discharge Instructions      Your evaluated for the laceration to your left knee  Due to the amount of bleeding x-ray has been completed, you will be notified of results via telephone either this afternoon or first thing in the morning  Will to control bleeding with manual pressure and a small amount of epinephrine and lidocaine  3 sutures please return in about 14 days for removal  Up-to-date your tetanus therefore will not need today, last documented June 2024  Cleanse wound and apply compression dressing May remove prior to bed this evening  Cleanse daily with unscented soap and water, pat and do not rub, may leave open to air but if at risk for becoming dirty or friction from clothing please apply a nonstick Band-Aid  May return for any increased swelling, pus, increased pain or redness as these are signs of infection     ED Prescriptions     Medication Sig Dispense Auth. Provider    predniSONE (DELTASONE) 20 MG tablet Take 2 tablets (40 mg total) by mouth daily. 10 tablet Zionah Criswell R, NP   benzonatate (TESSALON) 100 MG capsule Take 1 capsule (100 mg total) by mouth every 8 (eight) hours. 21 capsule Folashade Gamboa R, NP   promethazine-dextromethorphan (PROMETHAZINE-DM) 6.25-15 MG/5ML syrup Take 5 mLs by mouth at bedtime as needed for cough. 118 mL [place]alohilani Maiorino, Elita Boone, NP      PDMP not reviewed this encounter.   Valinda Hoar, NP 10/22/23 [phone]

## 2023-11-01 ENCOUNTER — Telehealth: Payer: Self-pay | Admitting: *Deleted

## 2023-11-01 DIAGNOSIS — E7800 Pure hypercholesterolemia, unspecified: Secondary | ICD-10-CM

## 2023-11-01 DIAGNOSIS — Z125 Encounter for screening for malignant neoplasm of prostate: Secondary | ICD-10-CM

## 2023-11-01 DIAGNOSIS — Z79899 Other long term (current) drug therapy: Secondary | ICD-10-CM

## 2023-11-01 DIAGNOSIS — E1159 Type 2 diabetes mellitus with other circulatory complications: Secondary | ICD-10-CM

## 2023-11-01 NOTE — Telephone Encounter (Signed)
-----   Message from Lovena Neighbours sent at 11/01/2023  1:57 PM EST ----- Regarding: Labs for Wednesday 12.4.24 Please put physical lab orders in future. Thank you, Denny Peon

## 2023-11-04 ENCOUNTER — Ambulatory Visit
Admission: EM | Admit: 2023-11-04 | Discharge: 2023-11-04 | Disposition: A | Discharge status: Home / Self Care | Payer: 59

## 2023-11-04 DIAGNOSIS — Z4802 Encounter for removal of sutures: Secondary | ICD-10-CM | POA: Diagnosis not present

## 2023-11-04 NOTE — ED Triage Notes (Signed)
Patient presents to UC for suture removal to left knee placed 11/9. No s/s of infection present.

## 2023-11-15 ENCOUNTER — Other Ambulatory Visit (INDEPENDENT_AMBULATORY_CARE_PROVIDER_SITE_OTHER): Payer: 59

## 2023-11-15 DIAGNOSIS — E1159 Type 2 diabetes mellitus with other circulatory complications: Secondary | ICD-10-CM | POA: Diagnosis not present

## 2023-11-15 DIAGNOSIS — N521 Erectile dysfunction due to diseases classified elsewhere: Secondary | ICD-10-CM | POA: Diagnosis not present

## 2023-11-15 DIAGNOSIS — Z79899 Other long term (current) drug therapy: Secondary | ICD-10-CM | POA: Diagnosis not present

## 2023-11-15 DIAGNOSIS — E7800 Pure hypercholesterolemia, unspecified: Secondary | ICD-10-CM | POA: Diagnosis not present

## 2023-11-15 DIAGNOSIS — Z125 Encounter for screening for malignant neoplasm of prostate: Secondary | ICD-10-CM

## 2023-11-15 LAB — CBC WITH DIFFERENTIAL/PLATELET
Basophils Absolute: 0 10*3/uL (ref 0.0–0.1)
Basophils Relative: 0.3 % (ref 0.0–3.0)
Eosinophils Absolute: 0.2 10*3/uL (ref 0.0–0.7)
Eosinophils Relative: 3.7 % (ref 0.0–5.0)
HCT: 43.5 % (ref 39.0–52.0)
Hemoglobin: 14.1 g/dL (ref 13.0–17.0)
Lymphocytes Relative: 29.4 % (ref 12.0–46.0)
Lymphs Abs: 1.8 10*3/uL (ref 0.7–4.0)
MCHC: 32.4 g/dL (ref 30.0–36.0)
MCV: 92.5 fL (ref 78.0–100.0)
Monocytes Absolute: 0.8 10*3/uL (ref 0.1–1.0)
Monocytes Relative: 12.9 % — ABNORMAL HIGH (ref 3.0–12.0)
Neutro Abs: 3.3 10*3/uL (ref 1.4–7.7)
Neutrophils Relative %: 53.7 % (ref 43.0–77.0)
Platelets: 294 10*3/uL (ref 150.0–400.0)
RBC: 4.71 Mil/uL (ref 4.22–5.81)
RDW: 13.9 % (ref 11.5–15.5)
WBC: 6.1 10*3/uL (ref 4.0–10.5)

## 2023-11-15 LAB — MICROALBUMIN / CREATININE URINE RATIO
Creatinine,U: 142.2 mg/dL
Microalb Creat Ratio: 3.8 mg/g (ref 0.0–30.0)
Microalb, Ur: 5.5 mg/dL — ABNORMAL HIGH (ref 0.0–1.9)

## 2023-11-15 LAB — LIPID PANEL
Cholesterol: 180 mg/dL (ref 0–200)
HDL: 50.4 mg/dL (ref >39.00)
LDL Cholesterol: 120 mg/dL — ABNORMAL HIGH (ref 0–99)
NonHDL: 129.56
Total CHOL/HDL Ratio: 4
Triglycerides: 47 mg/dL (ref 0.0–149.0)
VLDL: 9.4 mg/dL (ref 0.0–40.0)

## 2023-11-15 LAB — BASIC METABOLIC PANEL
BUN: 18 mg/dL (ref 6–23)
CO2: 29 meq/L (ref 19–32)
Calcium: 9.4 mg/dL (ref 8.4–10.5)
Chloride: 105 meq/L (ref 96–112)
Creatinine, Ser: 0.65 mg/dL (ref 0.40–1.50)
GFR: 104.53 mL/min (ref >60.00)
Glucose, Bld: 165 mg/dL — ABNORMAL HIGH (ref 70–99)
Potassium: 4.1 meq/L (ref 3.5–5.1)
Sodium: 141 meq/L (ref 135–145)

## 2023-11-15 LAB — HEPATIC FUNCTION PANEL
ALT: 24 U/L (ref 0–53)
AST: 16 U/L (ref 0–37)
Albumin: 4.3 g/dL (ref 3.5–5.2)
Alkaline Phosphatase: 115 U/L (ref 39–117)
Bilirubin, Direct: 0 mg/dL (ref 0.0–0.3)
Total Bilirubin: 0.4 mg/dL (ref 0.2–1.2)
Total Protein: 6.5 g/dL (ref 6.0–8.3)

## 2023-11-15 LAB — HEMOGLOBIN A1C: Hgb A1c MFr Bld: 6.2 % (ref 4.6–6.5)

## 2023-11-16 LAB — PSA, TOTAL WITH REFLEX TO PSA, FREE: PSA, Total: 1 ng/mL (ref <=4.0)

## 2023-11-19 NOTE — Progress Notes (Unsigned)
Eric Colan T. Hamlet Lasecki, MD, CAQ Sports Medicine [facility] at [facility] [address] [place], [address]  Phone: [phone]  FAX: [phone]  Eric Frederick - 57 y.o. male  MRN [id number]  Date of Birth: [birth year]  Date: 11/22/2023  PCP: Hannah Beat, MD  Referral: Hannah Beat, MD  No chief complaint on file.  Patient Care Team: Hannah Beat, MD as PCP - General Subjective:   Eric Frederick is a 57 y.o. pleasant patient who presents with the following:  Preventative Health Maintenance Visit:  Health Maintenance Summary Reviewed and updated, unless pt declines services.  Tobacco History Reviewed. Alcohol: No concerns, no excessive use Exercise Habits: Some activity, rec at least 30 mins 5 times a week STD concerns: no risk or activity to increase risk Drug Use: None  Colon Eye exam Flu   Health Maintenance  Topic Date Due   Colonoscopy  06/29/2021   OPHTHALMOLOGY EXAM  02/24/2023   INFLUENZA VACCINE  07/13/2023   COVID-19 Vaccine (7 - 2023-24 season) 08/13/2023   HEMOGLOBIN A1C  05/15/2024   FOOT EXAM  05/23/2024   Diabetic kidney evaluation - eGFR measurement  11/14/2024   Diabetic kidney evaluation - Urine ACR  11/14/2024   DTaP/Tdap/Td (3 - Td or Tdap) 05/23/2033   Hepatitis C Screening  Completed   HIV Screening  Completed   Zoster Vaccines- Shingrix  Completed   HPV VACCINES  Aged Out   Immunization History  Administered Date(s) Administered   Influenza Split 09/12/2012   Influenza,inj,Quad PF,6+ Mos 10/22/2014, 10/28/2015, 10/04/2017, 09/26/2019, 10/26/2020, 01/11/2023   PFIZER(Purple Top)SARS-COV-2 Vaccination 02/25/2020, 03/17/2020, 10/20/2020, 03/16/2021   Pfizer Covid-19 Vaccine Bivalent Booster 5y-11y 09/14/2021   Pneumococcal Conjugate-13 10/22/2014   Pneumococcal Polysaccharide-23 03/13/2013, 12/20/2017   Tdap 03/13/2013, 05/24/2023   Unspecified SARS-COV-2 Vaccination 09/30/2022   Zoster  Recombinant(Shingrix) 07/12/2021, 06/29/2022   Patient Active Problem List   Diagnosis Date Noted   Severe obesity (BMI >= 40) (HCC) 03/12/2014    Priority: High   Type 2 diabetes with circulatory disorder causing erectile dysfunction (HCC) 07/21/2010    Priority: High   HYPERCHOLESTEROLEMIA 07/21/2010    Priority: Medium    Essential hypertension 07/21/2010    Priority: Medium    Severe bilateral knee arthritis 04/14/2020   Hiatal hernia 04/22/2015   Erectile dysfunction associated with type 2 diabetes mellitus (HCC) 03/02/2011    Past Medical History:  Diagnosis Date   Diabetes mellitus    Diverticulosis    GERD (gastroesophageal reflux disease)    Hiatal hernia 04/22/2015   Hyperlipidemia    Hypertension    Serrated adenoma of colon 06/2016    Past Surgical History:  Procedure Laterality Date   SHOULDER ARTHROSCOPY W/ ROTATOR CUFF REPAIR Right 2014   [facility]    Family History  Problem Relation Age of Onset   Other Father        complications from agent orange   Colon cancer Neg Hx     Social History   Social History Narrative   Not on file    Past Medical History, Surgical History, Social History, Family History, Problem List, Medications, and Allergies have been reviewed and updated if relevant.  Review of Systems: Pertinent positives are listed above.  Otherwise, a full 14 point review of systems has been done in full and it is negative except where it is noted positive.  Objective:   There were no vitals taken for this visit. Ideal Body Weight:    Ideal  Body Weight:   No results found.    01/11/2023    2:26 PM 06/29/2022    9:44 AM 04/21/2021    8:49 AM 04/08/2020    9:11 AM 07/11/2018    9:37 AM  Depression screen PHQ 2/9  Decreased Interest 0 0 0 0 0  Down, Depressed, Hopeless 0 0 0 0 0  PHQ - 2 Score 0 0 0 0 0     GEN: well developed, well nourished, no acute distress Eyes: conjunctiva and lids normal, PERRLA, EOMI ENT: TM clear, nares  clear, oral exam WNL Neck: supple, no lymphadenopathy, no thyromegaly, no JVD Pulm: clear to auscultation and percussion, respiratory effort normal CV: regular rate and rhythm, S1-S2, no murmur, rub or gallop, no bruits, peripheral pulses normal and symmetric, no cyanosis, clubbing, edema or varicosities GI: soft, non-tender; no hepatosplenomegaly, masses; active bowel sounds all quadrants GU: deferred Lymph: no cervical, axillary or inguinal adenopathy MSK: gait normal, muscle tone and strength WNL, no joint swelling, effusions, discoloration, crepitus  SKIN: clear, good turgor, color WNL, no rashes, lesions, or ulcerations Neuro: normal mental status, normal strength, sensation, and motion Psych: alert; oriented to person, place and time, normally interactive and not anxious or depressed in appearance.  All labs reviewed with patient. Results for orders placed or performed in visit on 11/15/23  PSA, Total with Reflex to PSA, Free  Result Value Ref Range   PSA, Total 1.0 < OR = 4.0 ng/mL  Hepatic Function Panel  Result Value Ref Range   Total Bilirubin 0.4 0.2 - 1.2 mg/dL   Bilirubin, Direct 0.0 0.0 - 0.3 mg/dL   Alkaline Phosphatase 115 39 - 117 U/L   AST 16 0 - 37 U/L   ALT 24 0 - 53 U/L   Total Protein 6.5 6.0 - 8.3 g/dL   Albumin 4.3 3.5 - 5.2 g/dL  Basic metabolic panel  Result Value Ref Range   Sodium 141 135 - 145 mEq/L   Potassium 4.1 3.5 - 5.1 mEq/L   Chloride 105 96 - 112 mEq/L   CO2 29 19 - 32 mEq/L   Glucose, Bld 165 (H) 70 - 99 mg/dL   BUN 18 6 - 23 mg/dL   Creatinine, Ser 4.78 0.40 - 1.50 mg/dL   GFR 295.62 >13.08 mL/min   Calcium 9.4 8.4 - 10.5 mg/dL  CBC with Differential/Platelet  Result Value Ref Range   WBC 6.1 4.0 - 10.5 K/uL   RBC 4.71 4.22 - 5.81 Mil/uL   Hemoglobin 14.1 13.0 - 17.0 g/dL   HCT 65.7 84.6 - 96.2 %   MCV 92.5 78.0 - 100.0 fl   MCHC 32.4 30.0 - 36.0 g/dL   RDW 95.2 84.1 - 32.4 %   Platelets 294.0 150.0 - 400.0 K/uL   Neutrophils  Relative % 53.7 43.0 - 77.0 %   Lymphocytes Relative 29.4 12.0 - 46.0 %   Monocytes Relative 12.9 (H) 3.0 - 12.0 %   Eosinophils Relative 3.7 0.0 - 5.0 %   Basophils Relative 0.3 0.0 - 3.0 %   Neutro Abs 3.3 1.4 - 7.7 K/uL   Lymphs Abs 1.8 0.7 - 4.0 K/uL   Monocytes Absolute 0.8 0.1 - 1.0 K/uL   Eosinophils Absolute 0.2 0.0 - 0.7 K/uL   Basophils Absolute 0.0 0.0 - 0.1 K/uL  Microalbumin / creatinine urine ratio  Result Value Ref Range   Microalb, Ur 5.5 (H) 0.0 - 1.9 mg/dL   Creatinine,U 401.0 mg/dL   Microalb Creat Ratio  3.8 0.0 - 30.0 mg/g  Lipid panel  Result Value Ref Range   Cholesterol 180 0 - 200 mg/dL   Triglycerides 29.5 0.0 - 149.0 mg/dL   HDL 28.41 >32.44 mg/dL   VLDL 9.4 0.0 - 01.0 mg/dL   LDL Cholesterol 272 (H) 0 - 99 mg/dL   Total CHOL/HDL Ratio 4    NonHDL 129.56   Hemoglobin A1c  Result Value Ref Range   Hgb A1c MFr Bld 6.2 4.6 - 6.5 %    Assessment and Plan:     ICD-10-CM   1. Healthcare maintenance  Z00.00       Health Maintenance Exam: The patient's preventative maintenance and recommended screening tests for an annual wellness exam were reviewed in full today. Brought up to date unless services declined.  Counselled on the importance of diet, exercise, and its role in overall health and mortality. The patient's FH and SH was reviewed, including their home life, tobacco status, and drug and alcohol status.  Follow-up in 1 year for physical exam or additional follow-up below.  Disposition: No follow-ups on file.  No orders of the defined types were placed in this encounter.  There are no discontinued medications. No orders of the defined types were placed in this encounter.   Signed,  Elpidio Galea. Tanae Petrosky, MD   Allergies as of 11/22/2023       Reactions   Glipizide         Medication List        Accurate as of November 19, 2023 11:57 AM. If you have any questions, ask your nurse or doctor.          benzonatate 100 MG  capsule Commonly known as: TESSALON Take 1 capsule (100 mg total) by mouth every 8 (eight) hours.   lisinopril 40 MG tablet Commonly known as: ZESTRIL Take 1 tablet (40 mg total) by mouth daily.   metFORMIN 1000 MG tablet Commonly known as: GLUCOPHAGE Take 1 tablet (1,000 mg total) by mouth 2 (two) times daily.   omeprazole 40 MG capsule Commonly known as: PRILOSEC Take 1 capsule (40 mg total) by mouth daily.   predniSONE 20 MG tablet Commonly known as: DELTASONE Take 2 tablets (40 mg total) by mouth daily.   promethazine-dextromethorphan 6.25-15 MG/5ML syrup Commonly known as: PROMETHAZINE-DM Take 5 mLs by mouth at bedtime as needed for cough.   Semaglutide (2 MG/DOSE) 8 MG/3ML Sopn Inject 2 mg as directed once a week.   sildenafil 100 MG tablet Commonly known as: Viagra Take 0.5-1 tablets (50-100 mg total) by mouth daily as needed for erectile dysfunction.

## 2023-11-22 ENCOUNTER — Ambulatory Visit (INDEPENDENT_AMBULATORY_CARE_PROVIDER_SITE_OTHER): Payer: 59 | Admitting: Family Medicine

## 2023-11-22 ENCOUNTER — Encounter: Payer: Self-pay | Admitting: Family Medicine

## 2023-11-22 VITALS — BP 140/70 | HR 109 | Temp 97.9°F | Ht 64.25 in | Wt 270.4 lb

## 2023-11-22 DIAGNOSIS — Z23 Encounter for immunization: Secondary | ICD-10-CM | POA: Diagnosis not present

## 2023-11-22 DIAGNOSIS — Z1211 Encounter for screening for malignant neoplasm of colon: Secondary | ICD-10-CM

## 2023-11-22 DIAGNOSIS — E7800 Pure hypercholesterolemia, unspecified: Secondary | ICD-10-CM

## 2023-11-22 DIAGNOSIS — Z0000 Encounter for general adult medical examination without abnormal findings: Secondary | ICD-10-CM

## 2023-11-22 MED ORDER — SILDENAFIL CITRATE 100 MG PO TABS: 50.0000 mg | ORAL_TABLET | Freq: Every day | ORAL | 11 refills | Status: DC | PRN

## 2023-11-22 MED ORDER — LISINOPRIL 40 MG PO TABS: 40.0000 mg | ORAL_TABLET | Freq: Every day | ORAL | 3 refills | Status: DC

## 2023-11-22 MED ORDER — ROSUVASTATIN CALCIUM 10 MG PO TABS: 10.0000 mg | ORAL_TABLET | Freq: Every day | ORAL | 3 refills | Status: DC

## 2023-11-22 MED ORDER — TIRZEPATIDE 10 MG/0.5ML ~~LOC~~ SOAJ: 10.0000 mg | SUBCUTANEOUS | 3 refills | Status: DC

## 2023-11-22 MED ORDER — OMEPRAZOLE 40 MG PO CPDR: 40.0000 mg | DELAYED_RELEASE_CAPSULE | Freq: Every day | ORAL | 3 refills | Status: DC

## 2023-11-22 MED ORDER — METFORMIN HCL 1000 MG PO TABS: 1000.0000 mg | ORAL_TABLET | Freq: Two times a day (BID) | ORAL | 3 refills | Status: DC

## 2024-02-28 ENCOUNTER — Encounter: Payer: Self-pay | Admitting: Internal Medicine

## 2024-03-06 LAB — HM DIABETES EYE EXAM

## 2024-03-13 ENCOUNTER — Ambulatory Visit (AMBULATORY_SURGERY_CENTER)

## 2024-03-13 VITALS — Ht 64.25 in | Wt 260.0 lb

## 2024-03-13 DIAGNOSIS — Z860100 Personal history of colon polyps, unspecified: Secondary | ICD-10-CM

## 2024-03-13 MED ORDER — SUFLAVE 178.7 G PO SOLR: 1.0000 | Freq: Once | ORAL | 0 refills | Status: AC

## 2024-03-13 NOTE — Progress Notes (Signed)
 No egg or soy allergy known to patient  No issues known to pt with past sedation with any surgeries or procedures Patient denies ever being told they had issues or difficulty with intubation  No FH of Malignant Hyperthermia Pt is on Mounjaro, Hold instructions provided. Pt is not on  home 02  Pt is not on blood thinners  Pt denies issues with constipation  No A fib or A flutter Have any cardiac testing pending--No Pt can ambulate  Pt denies use of chewing tobacco Discussed diabetic I weight loss medication holds Discussed NSAID holds Checked BMI Pt instructed to use Single[employer]com or GoodRx for a price reduction on prep  Patient's chart reviewed by Cathlyn Parsons CNRA prior to previsit and patient appropriate for the [facility].  Pre visit completed and red dot placed by patient's name on their procedure day (on provider's schedule).

## 2024-04-01 ENCOUNTER — Encounter: Payer: Self-pay | Admitting: Internal Medicine

## 2024-04-02 ENCOUNTER — Other Ambulatory Visit: Payer: Self-pay

## 2024-04-02 ENCOUNTER — Ambulatory Visit: Payer: Self-pay

## 2024-04-02 ENCOUNTER — Inpatient Hospital Stay (HOSPITAL_COMMUNITY)
Admission: EM | Admit: 2024-04-02 | Discharge: 2024-04-06 | DRG: 280 | Disposition: A | Discharge status: Home / Self Care | Attending: Internal Medicine | Admitting: Internal Medicine

## 2024-04-02 ENCOUNTER — Ambulatory Visit (INDEPENDENT_AMBULATORY_CARE_PROVIDER_SITE_OTHER)

## 2024-04-02 ENCOUNTER — Encounter: Payer: Self-pay | Admitting: Nurse Practitioner

## 2024-04-02 ENCOUNTER — Telehealth: Payer: Self-pay | Admitting: Internal Medicine

## 2024-04-02 ENCOUNTER — Ambulatory Visit: Admitting: Nurse Practitioner

## 2024-04-02 VITALS — BP 138/86 | HR 105 | Temp 98.6°F | Ht 64.25 in | Wt 273.6 lb

## 2024-04-02 DIAGNOSIS — E66813 Obesity, class 3: Secondary | ICD-10-CM | POA: Diagnosis present

## 2024-04-02 DIAGNOSIS — Z87891 Personal history of nicotine dependence: Secondary | ICD-10-CM

## 2024-04-02 DIAGNOSIS — Z7984 Long term (current) use of oral hypoglycemic drugs: Secondary | ICD-10-CM

## 2024-04-02 DIAGNOSIS — R0602 Shortness of breath: Secondary | ICD-10-CM | POA: Diagnosis not present

## 2024-04-02 DIAGNOSIS — I21A1 Myocardial infarction type 2: Secondary | ICD-10-CM | POA: Diagnosis present

## 2024-04-02 DIAGNOSIS — Z6841 Body Mass Index (BMI) 40.0 and over, adult: Secondary | ICD-10-CM

## 2024-04-02 DIAGNOSIS — Z79899 Other long term (current) drug therapy: Secondary | ICD-10-CM

## 2024-04-02 DIAGNOSIS — R058 Other specified cough: Secondary | ICD-10-CM | POA: Diagnosis not present

## 2024-04-02 DIAGNOSIS — Z1152 Encounter for screening for COVID-19: Secondary | ICD-10-CM

## 2024-04-02 DIAGNOSIS — E785 Hyperlipidemia, unspecified: Secondary | ICD-10-CM | POA: Diagnosis present

## 2024-04-02 DIAGNOSIS — I509 Heart failure, unspecified: Principal | ICD-10-CM

## 2024-04-02 DIAGNOSIS — I110 Hypertensive heart disease with heart failure: Secondary | ICD-10-CM | POA: Diagnosis not present

## 2024-04-02 DIAGNOSIS — E119 Type 2 diabetes mellitus without complications: Secondary | ICD-10-CM | POA: Diagnosis present

## 2024-04-02 DIAGNOSIS — I472 Ventricular tachycardia, unspecified: Secondary | ICD-10-CM | POA: Diagnosis not present

## 2024-04-02 DIAGNOSIS — I5031 Acute diastolic (congestive) heart failure: Secondary | ICD-10-CM | POA: Diagnosis present

## 2024-04-02 DIAGNOSIS — Z96653 Presence of artificial knee joint, bilateral: Secondary | ICD-10-CM | POA: Diagnosis present

## 2024-04-02 DIAGNOSIS — K219 Gastro-esophageal reflux disease without esophagitis: Secondary | ICD-10-CM | POA: Diagnosis present

## 2024-04-02 DIAGNOSIS — Z7985 Long-term (current) use of injectable non-insulin antidiabetic drugs: Secondary | ICD-10-CM

## 2024-04-02 DIAGNOSIS — R059 Cough, unspecified: Secondary | ICD-10-CM | POA: Insufficient documentation

## 2024-04-02 LAB — COMPREHENSIVE METABOLIC PANEL WITH GFR
ALT: 39 U/L (ref 0–44)
AST: 24 U/L (ref 15–41)
Albumin: 3.8 g/dL (ref 3.5–5.0)
Alkaline Phosphatase: 79 U/L (ref 38–126)
Anion gap: 10 (ref 5–15)
BUN: 11 mg/dL (ref 6–20)
CO2: 24 mmol/L (ref 22–32)
Calcium: 9.4 mg/dL (ref 8.9–10.3)
Chloride: 110 mmol/L (ref 98–111)
Creatinine, Ser: 0.76 mg/dL (ref 0.61–1.24)
GFR, Estimated: >60 mL/min (ref >60)
Glucose, Bld: 94 mg/dL (ref 70–99)
Potassium: 3.8 mmol/L (ref 3.5–5.1)
Sodium: 144 mmol/L (ref 135–145)
Total Bilirubin: 1 mg/dL (ref 0.0–1.2)
Total Protein: 6.3 g/dL — ABNORMAL LOW (ref 6.5–8.1)

## 2024-04-02 LAB — CBC WITH DIFFERENTIAL/PLATELET
Abs Immature Granulocytes: 0.02 10*3/uL (ref 0.00–0.07)
Basophils Absolute: 0 10*3/uL (ref 0.0–0.1)
Basophils Relative: 0 %
Eosinophils Absolute: 0.1 10*3/uL (ref 0.0–0.5)
Eosinophils Relative: 1 %
HCT: 41.8 % (ref 39.0–52.0)
Hemoglobin: 13.8 g/dL (ref 13.0–17.0)
Immature Granulocytes: 0 %
Lymphocytes Relative: 23 %
Lymphs Abs: 1.8 10*3/uL (ref 0.7–4.0)
MCH: 29.9 pg (ref 26.0–34.0)
MCHC: 33 g/dL (ref 30.0–36.0)
MCV: 90.5 fL (ref 80.0–100.0)
Monocytes Absolute: 1 10*3/uL (ref 0.1–1.0)
Monocytes Relative: 12 %
Neutro Abs: 5.1 10*3/uL (ref 1.7–7.7)
Neutrophils Relative %: 64 %
Platelets: 263 10*3/uL (ref 150–400)
RBC: 4.62 MIL/uL (ref 4.22–5.81)
RDW: 13 % (ref 11.5–15.5)
WBC: 8 10*3/uL (ref 4.0–10.5)
nRBC: 0 % (ref 0.0–0.2)

## 2024-04-02 LAB — RESP PANEL BY RT-PCR (RSV, FLU A&B, COVID)  RVPGX2
Influenza A by PCR: NEGATIVE
Influenza B by PCR: NEGATIVE
Resp Syncytial Virus by PCR: NEGATIVE
SARS Coronavirus 2 by RT PCR: NEGATIVE

## 2024-04-02 LAB — D-DIMER, QUANTITATIVE: D-Dimer, Quant: 1.7 ug{FEU}/mL — ABNORMAL HIGH (ref 0.00–0.50)

## 2024-04-02 LAB — TROPONIN I (HIGH SENSITIVITY)
Troponin I (High Sensitivity): 71 ng/L — ABNORMAL HIGH (ref <18)
Troponin I (High Sensitivity): 83 ng/L — ABNORMAL HIGH (ref <18)

## 2024-04-02 LAB — BRAIN NATRIURETIC PEPTIDE: B Natriuretic Peptide: 242.9 pg/mL — ABNORMAL HIGH (ref 0.0–100.0)

## 2024-04-02 MED ORDER — DOXYCYCLINE HYCLATE 100 MG PO TABS: 100.0000 mg | ORAL_TABLET | Freq: Two times a day (BID) | ORAL | 0 refills | Status: DC

## 2024-04-02 MED ORDER — PREDNISONE 10 MG PO TABS: ORAL_TABLET | ORAL | 0 refills | Status: DC

## 2024-04-02 MED ORDER — ALBUTEROL SULFATE HFA 108 (90 BASE) MCG/ACT IN AERS: 2.0000 | INHALATION_SPRAY | Freq: Four times a day (QID) | RESPIRATORY_TRACT | 0 refills | Status: DC | PRN

## 2024-04-02 NOTE — Telephone Encounter (Signed)
 Copied from CRM [id number]. Topic: Clinical - Red Word Triage >> Apr 02, 2024  9:51 AM Elita Guitar wrote: Red Word that prompted transfer to Nurse Triage: has been having SOB when moving around for several days, worried about colonoscopy tomorrow because he is going under anesthesia.  Chief Complaint: Mild SOB upon exertion Symptoms: Chest congestion Frequency: A few days Pertinent Negatives: Patient denies SOB at rest Disposition: [] ED /[] Urgent Care (no appt availability in office) / [x] Appointment(In office/virtual)/ []  [place] [facility]/ [] Home Care/ [] Refused Recommended Disposition /[] [place] [facility]/ []  Follow-up with PCP Additional Notes: Patient called in to report mild SOB upon exertion. Patient stated this has been a new onset over the past few days. Patient stated he feels as if he has a slight cold and chest congestion as well. Patient is mostly concerned because he has a colonoscopy scheduled for tomorrow morning. Advised patient to be seen within 4 hours, per protocol. No availability with PCP/PCP office. Transferred to [facility] for scheduling.   Reason for Disposition  [1] MILD difficulty breathing (e.g., minimal/no SOB at rest, SOB with walking, pulse <100) AND [2] NEW-onset or WORSE than normal  Answer Assessment - Initial Assessment Questions 1. RESPIRATORY STATUS: "Describe your breathing?" (e.g., wheezing, shortness of breath, unable to speak, severe coughing)      SOB upon exertion and walking long distances  2. ONSET: "When did this breathing problem begin?"      A few days 3. PATTERN "Does the difficult breathing come and go, or has it been constant since it started?"      SOB onsets after he starts moving 4. SEVERITY: "How bad is your breathing?" (e.g., mild, moderate, severe)    - MILD: No SOB at rest, mild SOB with walking, speaks normally in sentences, can lie down, no retractions, pulse < 100.    - MODERATE: SOB at rest, SOB with minimal exertion and prefers to  sit, cannot lie down flat, speaks in phrases, mild retractions, audible wheezing, pulse 100-120.    - SEVERE: Very SOB at rest, speaks in single words, struggling to breathe, sitting hunched forward, retractions, pulse > 120      Mild- SOB upon exertion 5. RECURRENT SYMPTOM: "Have you had difficulty breathing before?" If Yes, ask: "When was the last time?" and "What happened that time?"      Denies 6. CARDIAC HISTORY: "Do you have any history of heart disease?" (e.g., heart attack, angina, bypass surgery, angioplasty)      Denies 7. LUNG HISTORY: "Do you have any history of lung disease?"  (e.g., pulmonary embolus, asthma, emphysema)     Denies 8. CAUSE: "What do you think is causing the breathing problem?"      Unknown 9. OTHER SYMPTOMS: "Do you have any other symptoms? (e.g., dizziness, runny nose, cough, chest pain, fever)     States like it "feels like his has a slight cold", chest congestion, "a little bit" of wheezing  Protocols used: Breathing Difficulty-A-AH

## 2024-04-02 NOTE — Assessment & Plan Note (Addendum)
 Cough, chest congestion with phlegm production. No fever or chest pain.  Will  start on doxycycline   and prednisone . While pt was in the office the Chest xay came negative and advised pt to go to the ER for further imaging and evaluation.

## 2024-04-02 NOTE — Progress Notes (Signed)
   Acute Office Visit  Subjective:     Patient ID: Eric Frederick, male    DOB: May 19, 1966, 58 y.o.   MRN: [id number]  Chief Complaint  Patient presents with   Cough    Cough  Patient is in today for   Review of Systems  Respiratory:  Positive for cough.         Objective:    Ht 5' 4.25" (1.632 m)   BMI 44.28 kg/m    Physical Exam  No results found for any visits on 04/02/24.      Assessment & Plan:   Problem List Items Addressed This Visit   None   No orders of the defined types were placed in this encounter.   No follow-ups on file.  Alexzander Dolinger, NP

## 2024-04-02 NOTE — ED Triage Notes (Signed)
 Pt c/o SOB since Sunday. Worsening when laying flat, associated with yellow cough. Denies fevers, chills, and sick exposure. Was seen earlier today at [facility] - recommended ED visit d/t abnormal EKG.

## 2024-04-02 NOTE — Telephone Encounter (Signed)
 Call back to pt, he is going to [facility] in [place] to be seen as feeling ill today, with respiratory sx, states advised not to have procedure tomorrow, states he will call to r/s after he is feeling better

## 2024-04-02 NOTE — Telephone Encounter (Signed)
 Looks like Eric Frederick has an appointment this afternoon, which I agree with.

## 2024-04-02 NOTE — Assessment & Plan Note (Addendum)
 Acute dyspnea with exertion. Denise chest pain, swelling in the lower extremities. Chest Xray negative in the office. EKG with nonspecific T wave changes. O2 saturation dropped to 82 with ambulation. Discussed case with Dr. Geralyn Knee and we both  agreed that the patient requires further evaluation and imaging to rule out cardiac issues and PE. Advised pt to proceed to the emergency room. Pt and his wife is agreeable to go to [facility] ED, Called and informed triage Nurse.

## 2024-04-02 NOTE — Progress Notes (Signed)
 Established Patient Office Visit  Subjective:  Patient ID: Eric Frederick, male    DOB: 1966-02-17  Age: 58 y.o. MRN: [id number]  CC:  Chief Complaint  Patient presents with   Cough    SOB    HPI  Eric Frederick is a 58 year old male who presents with shortness of breath,  chest congestion and cough.  He has been experiencing new set shortness of breath and chest congestion for the past couple of days, particularly pronounced at night, causing him to wake up due to difficulty breathing when lying flat. He finds relief by sitting up and coughing to clear his lungs. Last night, the congestion and wheezing required him to sit in a recliner and take Robitussin for relief.  His shortness of breath worsens with physical activity, such as walking from the waiting area to the examination room and from his car to his work truck, where he had to stop and rest due to feeling winded. He also feels tired and has a runny nose and chest tightness. No fever, chest pain, or swelling.  He has not experienced wheezing before, he is former smoker. He has never used inhalers and does not have asthma. He has had  cough for a couple of weeks.  Current medications include Robitussin for chest congestion.     Past Medical History:  Diagnosis Date   Diabetes mellitus    Diverticulosis    GERD (gastroesophageal reflux disease)    Hiatal hernia 04/22/2015   Hyperlipidemia    Hypertension    Serrated adenoma of colon 06/2016    Past Surgical History:  Procedure Laterality Date   KNEE ARTHROPLASTY Right    2023   SHOULDER ARTHROSCOPY W/ ROTATOR CUFF REPAIR Right 12/12/2012   [name]   TOTAL KNEE ARTHROPLASTY Left    2022    Family History  Problem Relation Age of Onset   Other Father        complications from agent orange   Colon cancer Neg Hx    Rectal cancer Neg Hx    Stomach cancer Neg Hx    Esophageal cancer Neg Hx     Social History   Socioeconomic History   Marital status:  Married    Spouse name: Hettie Lota   Number of children: 1   Years of education: Not on file   Highest education level: Bachelor's degree (e.g., BA, AB, BS)  Occupational History   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[employer] [place]  Tobacco Use   Smoking status: Former    Current packs/day: 0.00    Types: Cigarettes    Quit date: 03/02/1991    Years since quitting: 33.1   Smokeless tobacco: Never  Vaping Use   Vaping status: Never Used  Substance and Sexual Activity   Alcohol use: No    Alcohol/week: 0.0 standard drinks of alcohol   Drug use: No   Sexual activity: Not on file  Other Topics Concern   Not on file  Social History Narrative   Not on file   Social Drivers of Health   Financial Resource Strain: Low Risk  (05/20/2023)   Overall Financial Resource Strain (CARDIA)    Difficulty of Paying Living Expenses: Not hard at all  Food Insecurity: No Food Insecurity (05/20/2023)   Hunger Vital Sign    Worried About Running Out of Food in the Last Year: Never true    Ran Out of Food in the Last Year: Never true  Transportation Needs: No Transportation Needs (  05/20/2023)   PRAPARE - Administrator, Civil Service (Medical): No    Lack of Transportation (Non-Medical): No  Physical Activity: Sufficiently Active (05/20/2023)   Exercise Vital Sign    Days of Exercise per Week: 5 days    Minutes of Exercise per Session: 150+ min  Stress: No Stress Concern Present (05/20/2023)   [employer] of Occupational Health - Occupational Stress Questionnaire    Feeling of Stress : Not at all  Social Connections: Unknown (05/20/2023)   Social Connection and Isolation Panel [NHANES]    Frequency of Communication with Friends and Family: Patient declined    Frequency of Social Gatherings with Friends and Family: Patient declined    Attends Religious Services: More than 4 times per year    Active Member of [employer] or Organizations: Yes    Attends Banker Meetings: Patient  declined    Marital Status: Married  Catering manager Violence: Not on file     Outpatient Medications Prior to Visit  Medication Sig Dispense Refill   amoxicillin (AMOXIL) 500 MG capsule Take 1,000 mg by mouth 2 (two) times daily.     lisinopril  (ZESTRIL ) 40 MG tablet Take 1 tablet (40 mg total) by mouth daily. 90 tablet 3   metFORMIN  (GLUCOPHAGE ) 1000 MG tablet Take 1 tablet (1,000 mg total) by mouth 2 (two) times daily. 180 tablet 3   omeprazole  (PRILOSEC) 40 MG capsule Take 1 capsule (40 mg total) by mouth daily. 90 capsule 3   rosuvastatin  (CRESTOR ) 10 MG tablet Take 1 tablet (10 mg total) by mouth daily. 90 tablet 3   sildenafil  (VIAGRA ) 100 MG tablet Take 0.5-1 tablets (50-100 mg total) by mouth daily as needed for erectile dysfunction. 5 tablet 11   SUFLAVE  178.7 g SOLR SMARTSIG:1 Kit(s) By Mouth Once     tirzepatide (MOUNJARO) 10 MG/0.5ML Pen Inject 10 mg into the skin once a week. 8 mL 3   No facility-administered medications prior to visit.    Allergies  Allergen Reactions   Rosuvastatin  Hives   Glipizide      ROS Review of Systems  Respiratory:  Positive for cough.    Negative unless indicated in HPI.    Objective:    Physical Exam Constitutional:      Appearance: Normal appearance.  HENT:     Head: Normocephalic.     Right Ear: Tympanic membrane normal.     Left Ear: Tympanic membrane normal.  Cardiovascular:     Rate and Rhythm: Regular rhythm. Tachycardia present.     Pulses: Normal pulses.     Heart sounds: Normal heart sounds.  Pulmonary:     Effort: Pulmonary effort is normal.     Breath sounds: Normal breath sounds.  Neurological:     General: No focal deficit present.     Mental Status: He is alert and oriented to person, place, and time.     BP 138/86   Pulse (!) 105   Temp 98.6 F (37 C)   Ht 5' 4.25" (1.632 m)   Wt 273 lb 9.6 oz (124.1 kg)   SpO2 96%   BMI 46.60 kg/m  Wt Readings from Last 3 Encounters:  04/02/24 273 lb 9.6 oz  (124.1 kg)  03/13/24 260 lb (117.9 kg)  11/22/23 270 lb 6 oz (122.6 kg)     Health Maintenance  Topic Date Due   Colonoscopy  06/29/2021   Pneumococcal Vaccine 71-53 Years old (3 of 3 - PCV20 or PCV21) 12/20/2022  COVID-19 Vaccine (7 - 2024-25 season) 04/18/2024 (Originally 08/13/2023)   HEMOGLOBIN A1C  05/15/2024   FOOT EXAM  05/23/2024   INFLUENZA VACCINE  07/12/2024   Diabetic kidney evaluation - eGFR measurement  11/14/2024   Diabetic kidney evaluation - Urine ACR  11/14/2024   OPHTHALMOLOGY EXAM  03/06/2025   DTaP/Tdap/Td (3 - Td or Tdap) 05/23/2033   Hepatitis C Screening  Completed   HIV Screening  Completed   Zoster Vaccines- Shingrix   Completed   HPV VACCINES  Aged Out   Meningococcal B Vaccine  Aged Out    There are no preventive care reminders to display for this patient.  Lab Results  Component Value Date   TSH 0.74 07/28/2010   Lab Results  Component Value Date   WBC 6.1 11/15/2023   HGB 14.1 11/15/2023   HCT 43.5 11/15/2023   MCV 92.5 11/15/2023   PLT 294.0 11/15/2023   Lab Results  Component Value Date   NA 141 11/15/2023   K 4.1 11/15/2023   CO2 29 11/15/2023   GLUCOSE 165 (H) 11/15/2023   BUN 18 11/15/2023   CREATININE 0.65 11/15/2023   BILITOT 0.4 11/15/2023   ALKPHOS 115 11/15/2023   AST 16 11/15/2023   ALT 24 11/15/2023   PROT 6.5 11/15/2023   ALBUMIN 4.3 11/15/2023   CALCIUM  9.4 11/15/2023   ANIONGAP 9 10/29/2012   GFR 104.53 11/15/2023   Lab Results  Component Value Date   CHOL 180 11/15/2023   Lab Results  Component Value Date   HDL 50.40 11/15/2023   Lab Results  Component Value Date   LDLCALC 120 (H) 11/15/2023   Lab Results  Component Value Date   TRIG 47.0 11/15/2023   Lab Results  Component Value Date   CHOLHDL 4 11/15/2023   Lab Results  Component Value Date   HGBA1C 6.2 11/15/2023      Assessment & Plan:  SOB (shortness of breath) Assessment & Plan: Acute dyspnea with exertion. Denise chest pain,  swelling in the lower extremities. Chest Xray negative in the office. EKG with nonspecific T wave changes. O2 saturation dropped to 82 with ambulation. Discussed case with Dr. Geralyn Knee and we both  agreed that the patient requires further evaluation and imaging to rule out cardiac issues and PE. Advised pt to proceed to the emergency room. Pt and his wife is agreeable to go to [facility] ED, Called and informed triage Nurse.    Orders: -     DG Chest 2 View -     EKG 12-Lead  Other cough Assessment & Plan: Cough, chest congestion with phlegm production. No fever or chest pain.  Will  start on doxycycline   and prednisone . While pt was in the office the Chest xay came negative and advised pt to go to the ER for further imaging and evaluation.     Other orders -     predniSONE ; Take 4 tablets ( total 40 mg) by mouth for 2 days; take 3 tablets ( total 30 mg) by mouth for 2 days; take 2 tablets ( total 20 mg) by mouth for 1 day; take 1 tablet ( total 10 mg) by mouth for 1 day.  Dispense: 17 tablet; Refill: 0 -     Albuterol  Sulfate HFA; Inhale 2 puffs into the lungs every 6 (six) hours as needed for wheezing or shortness of breath.  Dispense: 8 g; Refill: 0 -     Doxycycline  Hyclate; Take 1 tablet (100 mg total) by mouth  2 (two) times daily for 7 days.  Dispense: 14 tablet; Refill: 0    Follow-up: No follow-ups on file.   Huntley Knoop, NP

## 2024-04-02 NOTE — ED Notes (Signed)
 Pts trop 83 and d-dimer. Acuity increased. Charge RN updated on need for room. Pt marked for room soon

## 2024-04-02 NOTE — ED Provider Triage Note (Addendum)
 Emergency Medicine Provider Triage Evaluation Note  Eric Frederick , a 58 y.o. male  was evaluated in triage.  Pt complains of shortness of breath for the past 3 days.  Reports this is worse when lying flat.  Also has been having a cough with yellow mucus.  Denies any fevers, chills, or sick exposure.  He was seen at [facility] primary care and recommended to come here due to an abnormality on his EKG.  Patient had chest x-ray done at outside facility earlier today which was normal.  Review of Systems  Positive: As above Negative: As above  Physical Exam  BP (!) 185/93 (BP Location: Left Arm)   Pulse (!) 102   Temp 99.2 F (37.3 C)   Resp 20   Ht 5' 4.25" (1.632 m)   Wt 124.1 kg   SpO2 95%   BMI 46.60 kg/m  Gen:   Awake, no distress   Resp:  Normal effort  MSK:   Moves extremities without difficulty    Medical Decision Making  Medically screening exam initiated at 7:16 PM.  Appropriate orders placed.  Eric Frederick was informed that the remainder of the evaluation will be completed by another provider, this initial triage assessment does not replace that evaluation, and the importance of remaining in the ED until their evaluation is complete.     Rexie Catena, PA-C 04/02/24 1916    Rexie Catena, PA-C 04/02/24 [phone]

## 2024-04-02 NOTE — Patient Instructions (Addendum)
 Start Doxcycline twice a day, take probiotics to reduce GI side effect. Doxycycline  should be taken with food to prevent nausea. Should not lie down for 30 minutes after taking the medication. Be cautious with sun exposure and use sunprotection while on the medication. Take prednisone  in the morning Take plain Mucinex to break the congestion. Incresae fluid intake and rest.

## 2024-04-02 NOTE — Telephone Encounter (Signed)
 Requesting call from nurse to discuss tomorrows procedure.   Patient states he had a recent o/v with another provider and they advised him not to proceed due to being under anesthesia.   Please advise., thank you

## 2024-04-03 ENCOUNTER — Encounter: Admitting: Internal Medicine

## 2024-04-03 ENCOUNTER — Emergency Department (HOSPITAL_COMMUNITY)

## 2024-04-03 DIAGNOSIS — I509 Heart failure, unspecified: Secondary | ICD-10-CM | POA: Diagnosis present

## 2024-04-03 DIAGNOSIS — Z79899 Other long term (current) drug therapy: Secondary | ICD-10-CM | POA: Diagnosis not present

## 2024-04-03 DIAGNOSIS — I21A1 Myocardial infarction type 2: Secondary | ICD-10-CM | POA: Diagnosis present

## 2024-04-03 DIAGNOSIS — E66813 Obesity, class 3: Secondary | ICD-10-CM | POA: Diagnosis present

## 2024-04-03 DIAGNOSIS — E119 Type 2 diabetes mellitus without complications: Secondary | ICD-10-CM | POA: Diagnosis present

## 2024-04-03 DIAGNOSIS — Z7985 Long-term (current) use of injectable non-insulin antidiabetic drugs: Secondary | ICD-10-CM | POA: Diagnosis not present

## 2024-04-03 DIAGNOSIS — Z7984 Long term (current) use of oral hypoglycemic drugs: Secondary | ICD-10-CM | POA: Diagnosis not present

## 2024-04-03 DIAGNOSIS — E785 Hyperlipidemia, unspecified: Secondary | ICD-10-CM | POA: Diagnosis present

## 2024-04-03 DIAGNOSIS — K219 Gastro-esophageal reflux disease without esophagitis: Secondary | ICD-10-CM | POA: Diagnosis present

## 2024-04-03 DIAGNOSIS — Z1152 Encounter for screening for COVID-19: Secondary | ICD-10-CM | POA: Diagnosis not present

## 2024-04-03 DIAGNOSIS — I5031 Acute diastolic (congestive) heart failure: Secondary | ICD-10-CM | POA: Diagnosis present

## 2024-04-03 DIAGNOSIS — Z87891 Personal history of nicotine dependence: Secondary | ICD-10-CM | POA: Diagnosis not present

## 2024-04-03 DIAGNOSIS — I472 Ventricular tachycardia, unspecified: Secondary | ICD-10-CM | POA: Diagnosis not present

## 2024-04-03 DIAGNOSIS — Z96653 Presence of artificial knee joint, bilateral: Secondary | ICD-10-CM | POA: Diagnosis present

## 2024-04-03 DIAGNOSIS — I5030 Unspecified diastolic (congestive) heart failure: Secondary | ICD-10-CM | POA: Diagnosis not present

## 2024-04-03 DIAGNOSIS — Z6841 Body Mass Index (BMI) 40.0 and over, adult: Secondary | ICD-10-CM | POA: Diagnosis not present

## 2024-04-03 DIAGNOSIS — I110 Hypertensive heart disease with heart failure: Secondary | ICD-10-CM | POA: Diagnosis present

## 2024-04-03 LAB — CBG MONITORING, ED
Glucose-Capillary: 163 mg/dL — ABNORMAL HIGH (ref 70–99)
Glucose-Capillary: 203 mg/dL — ABNORMAL HIGH (ref 70–99)
Glucose-Capillary: 172 mg/dL — ABNORMAL HIGH (ref 70–99)
Glucose-Capillary: 143 mg/dL — ABNORMAL HIGH (ref 70–99)

## 2024-04-03 LAB — C-REACTIVE PROTEIN: CRP: 1 mg/dL — ABNORMAL HIGH (ref <1.0)

## 2024-04-03 LAB — BASIC METABOLIC PANEL WITH GFR
Anion gap: 12 (ref 5–15)
BUN: 8 mg/dL (ref 6–20)
CO2: 26 mmol/L (ref 22–32)
Calcium: 9.1 mg/dL (ref 8.9–10.3)
Chloride: 103 mmol/L (ref 98–111)
Creatinine, Ser: 0.79 mg/dL (ref 0.61–1.24)
GFR, Estimated: >60 mL/min (ref >60)
Glucose, Bld: 116 mg/dL — ABNORMAL HIGH (ref 70–99)
Potassium: 3.9 mmol/L (ref 3.5–5.1)
Sodium: 141 mmol/L (ref 135–145)

## 2024-04-03 LAB — TROPONIN I (HIGH SENSITIVITY)
Troponin I (High Sensitivity): 64 ng/L — ABNORMAL HIGH (ref <18)
Troponin I (High Sensitivity): 64 ng/L — ABNORMAL HIGH (ref <18)

## 2024-04-03 LAB — PROCALCITONIN: Procalcitonin: <0.1 ng/mL

## 2024-04-03 LAB — HEMOGLOBIN A1C
Hgb A1c MFr Bld: 4.9 % (ref 4.8–5.6)
Mean Plasma Glucose: 93.93 mg/dL

## 2024-04-03 MED ORDER — SODIUM CHLORIDE 0.9 % IV SOLN
1.0000 g | Freq: Once | INTRAVENOUS | Status: AC
  Administered 2024-04-03: 1 g via INTRAVENOUS
  Filled 2024-04-03: qty 10

## 2024-04-03 MED ORDER — ENOXAPARIN SODIUM 40 MG/0.4ML IJ SOSY
40.0000 mg | PREFILLED_SYRINGE | INTRAMUSCULAR | Status: DC
  Administered 2024-04-03 – 2024-04-05 (×3): 40 mg via SUBCUTANEOUS
  Filled 2024-04-03 (×4): qty 0.4

## 2024-04-03 MED ORDER — SODIUM CHLORIDE 0.9% FLUSH
3.0000 mL | Freq: Two times a day (BID) | INTRAVENOUS | Status: DC
  Administered 2024-04-03 – 2024-04-06 (×6): 3 mL via INTRAVENOUS

## 2024-04-03 MED ORDER — FUROSEMIDE 10 MG/ML IJ SOLN
20.0000 mg | Freq: Once | INTRAMUSCULAR | Status: AC
  Administered 2024-04-03: 20 mg via INTRAVENOUS
  Filled 2024-04-03: qty 2

## 2024-04-03 MED ORDER — ONDANSETRON HCL 4 MG/2ML IJ SOLN: 4.0000 mg | Freq: Four times a day (QID) | INTRAMUSCULAR | Status: DC | PRN

## 2024-04-03 MED ORDER — ACETAMINOPHEN 325 MG PO TABS: 650.0000 mg | ORAL_TABLET | Freq: Four times a day (QID) | ORAL | Status: DC | PRN

## 2024-04-03 MED ORDER — PREDNISONE 5 MG PO TABS: 50.0000 mg | ORAL_TABLET | Freq: Every day | ORAL | Status: DC

## 2024-04-03 MED ORDER — SODIUM CHLORIDE 0.9 % IV SOLN: 500.0000 mg | Freq: Once | INTRAVENOUS | Status: DC

## 2024-04-03 MED ORDER — IOHEXOL 350 MG/ML SOLN
75.0000 mL | Freq: Once | INTRAVENOUS | Status: AC | PRN
  Administered 2024-04-03: 75 mL via INTRAVENOUS

## 2024-04-03 MED ORDER — INSULIN ASPART 100 UNIT/ML IJ SOLN
0.0000 [IU] | Freq: Three times a day (TID) | INTRAMUSCULAR | Status: DC
  Administered 2024-04-03 (×2): 3 [IU] via SUBCUTANEOUS
  Administered 2024-04-04 – 2024-04-05 (×5): 2 [IU] via SUBCUTANEOUS

## 2024-04-03 MED ORDER — LISINOPRIL 20 MG PO TABS
40.0000 mg | ORAL_TABLET | Freq: Every day | ORAL | Status: DC
  Administered 2024-04-03: 40 mg via ORAL
  Filled 2024-04-03: qty 2

## 2024-04-03 MED ORDER — PANTOPRAZOLE SODIUM 40 MG PO TBEC
40.0000 mg | DELAYED_RELEASE_TABLET | Freq: Every day | ORAL | Status: DC
  Administered 2024-04-03 – 2024-04-06 (×4): 40 mg via ORAL
  Filled 2024-04-03 (×4): qty 1

## 2024-04-03 MED ORDER — POLYETHYLENE GLYCOL 3350 17 G PO PACK
17.0000 g | PACK | Freq: Every day | ORAL | Status: DC
  Administered 2024-04-04 – 2024-04-06 (×3): 17 g via ORAL
  Filled 2024-04-03 (×3): qty 1

## 2024-04-03 MED ORDER — ACETAMINOPHEN 650 MG RE SUPP: 650.0000 mg | Freq: Four times a day (QID) | RECTAL | Status: DC | PRN

## 2024-04-03 MED ORDER — METFORMIN HCL 500 MG PO TABS
1000.0000 mg | ORAL_TABLET | Freq: Two times a day (BID) | ORAL | Status: DC
Start: 2024-04-03 — End: 2024-04-03

## 2024-04-03 MED ORDER — SODIUM CHLORIDE 0.9 % IV SOLN
500.0000 mg | Freq: Once | INTRAVENOUS | Status: AC
  Administered 2024-04-03: 500 mg via INTRAVENOUS
  Filled 2024-04-03: qty 5

## 2024-04-03 MED ORDER — INSULIN ASPART 100 UNIT/ML IJ SOLN: 0.0000 [IU] | Freq: Every day | INTRAMUSCULAR | Status: DC

## 2024-04-03 MED ORDER — SODIUM CHLORIDE 0.9 % IV SOLN: 1.0000 g | INTRAVENOUS | Status: DC

## 2024-04-03 MED ORDER — SENNA 8.6 MG PO TABS
1.0000 | ORAL_TABLET | Freq: Every day | ORAL | Status: DC
  Administered 2024-04-03 – 2024-04-05 (×3): 8.6 mg via ORAL
  Filled 2024-04-03 (×3): qty 1

## 2024-04-03 MED ORDER — IPRATROPIUM-ALBUTEROL 0.5-2.5 (3) MG/3ML IN SOLN: 3.0000 mL | Freq: Four times a day (QID) | RESPIRATORY_TRACT | Status: DC | PRN

## 2024-04-03 MED ORDER — FUROSEMIDE 10 MG/ML IJ SOLN
20.0000 mg | Freq: Two times a day (BID) | INTRAMUSCULAR | Status: DC
  Administered 2024-04-03 – 2024-04-04 (×2): 20 mg via INTRAVENOUS
  Filled 2024-04-03 (×2): qty 2

## 2024-04-03 MED ORDER — ONDANSETRON HCL 4 MG PO TABS
4.0000 mg | ORAL_TABLET | Freq: Four times a day (QID) | ORAL | Status: DC | PRN
Start: 2024-04-03 — End: 2024-04-06

## 2024-04-03 NOTE — ED Provider Notes (Signed)
 [place] EMERGENCY DEPARTMENT AT [facility] Provider Note   CSN: [id number] Arrival date & time: 04/02/24  1836     History  Chief Complaint  Patient presents with   Shortness of Breath    Eric Frederick is a 58 y.o. male.  Patient from urgent care with 3-day history of shortness of breath worse with exertion.  Has had a cough productive of yellow mucus worse at night.  Breathing is worse with lying flat and with exerting himself.  Denies chest pain.  Denies fever, chills, nausea, vomiting, leg pain or leg swelling.  No history of ACS or CHF.  Does have history of hypertension and diabetes.  Was referred by primary care because he desaturated to 82% with exertion.  Does not wear oxygen at home.  No history of COPD, asthma or CHF.  Does not smoke.  Cough productive of yellow mucus worse at night.  Denies chest pain.  Denies weight gain.  Denies abdominal pain, vomiting, fever, runny nose or sore throat.  The history is provided by the patient.  Shortness of Breath Associated symptoms: cough   Associated symptoms: no abdominal pain, no chest pain, no fever, no headaches, no rash and no vomiting        Home Medications Prior to Admission medications   Medication Sig Start Date End Date Taking? Authorizing Provider  albuterol  (VENTOLIN  HFA) 108 (90 Base) MCG/ACT inhaler Inhale 2 puffs into the lungs every 6 (six) hours as needed for wheezing or shortness of breath. 04/02/24   Kaur, Charanpreet, NP  amoxicillin (AMOXIL) 500 MG capsule Take 1,000 mg by mouth 2 (two) times daily. 03/13/24   [provider]  doxycycline  (VIBRA -TABS) 100 MG tablet Take 1 tablet (100 mg total) by mouth 2 (two) times daily for 7 days. 04/02/24 04/09/24  Kaur, Charanpreet, NP  lisinopril  (ZESTRIL ) 40 MG tablet Take 1 tablet (40 mg total) by mouth daily. 11/22/23   Copland, Jolena Nay, MD  metFORMIN  (GLUCOPHAGE ) 1000 MG tablet Take 1 tablet (1,000 mg total) by mouth 2 (two) times daily. 11/22/23    Copland, Jolena Nay, MD  omeprazole  (PRILOSEC) 40 MG capsule Take 1 capsule (40 mg total) by mouth daily. 11/22/23   Copland, Jolena Nay, MD  predniSONE  (DELTASONE ) 10 MG tablet Take 4 tablets ( total 40 mg) by mouth for 2 days; take 3 tablets ( total 30 mg) by mouth for 2 days; take 2 tablets ( total 20 mg) by mouth for 1 day; take 1 tablet ( total 10 mg) by mouth for 1 day. 04/02/24   Kaur, Charanpreet, NP  rosuvastatin  (CRESTOR ) 10 MG tablet Take 1 tablet (10 mg total) by mouth daily. 11/22/23   Copland, Jolena Nay, MD  sildenafil  (VIAGRA ) 100 MG tablet Take 0.5-1 tablets (50-100 mg total) by mouth daily as needed for erectile dysfunction. 11/22/23   Copland, Jolena Nay, MD  SUFLAVE  178.7 g SOLR SMARTSIG:1 Kit(s) By Mouth Once 03/18/24   [provider]  tirzepatide Florence Hunt) 10 MG/0.5ML Pen Inject 10 mg into the skin once a week. 11/22/23   Copland, Jolena Nay, MD      Allergies    Rosuvastatin  and Glipizide     Review of Systems   Review of Systems  Constitutional:  Negative for activity change, appetite change and fever.  HENT:  Negative for congestion and rhinorrhea.   Respiratory:  Positive for cough and shortness of breath. Negative for chest tightness.   Cardiovascular:  Positive for leg swelling. Negative for chest pain.  Gastrointestinal:  Negative  for abdominal pain, nausea and vomiting.  Genitourinary:  Negative for dysuria and hematuria.  Musculoskeletal:  Negative for arthralgias and myalgias.  Skin:  Negative for rash.  Neurological:  Negative for dizziness, weakness and headaches.   all other systems are negative except as noted in the HPI and PMH.    Physical Exam Updated Vital Signs BP (!) 189/103 (BP Location: Right Arm)   Pulse (!) 101   Temp 98.1 F (36.7 C) (Oral)   Resp 19   Ht 5' 4.25" (1.632 m)   Wt 124.1 kg   SpO2 98%   BMI 46.60 kg/m  Physical Exam Vitals and nursing note reviewed.  Constitutional:      General: He is not in acute distress.    Appearance:  He is well-developed.     Comments: Mild dyspnea with conversation  HENT:     Head: Normocephalic and atraumatic.     Mouth/Throat:     Pharynx: No oropharyngeal exudate.  Eyes:     Conjunctiva/sclera: Conjunctivae normal.     Pupils: Pupils are equal, round, and reactive to light.  Neck:     Comments: No meningismus. Cardiovascular:     Rate and Rhythm: Normal rate and regular rhythm.     Heart sounds: Normal heart sounds. No murmur heard. Pulmonary:     Effort: Pulmonary effort is normal. No respiratory distress.     Breath sounds: Rales present.  Abdominal:     Palpations: Abdomen is soft.     Tenderness: There is no abdominal tenderness. There is no guarding or rebound.  Musculoskeletal:        General: No tenderness. Normal range of motion.     Cervical back: Normal range of motion and neck supple.     Right lower leg: Edema present.     Left lower leg: Edema present.  Skin:    General: Skin is warm.  Neurological:     Mental Status: He is alert and oriented to person, place, and time.     Cranial Nerves: No cranial nerve deficit.     Motor: No abnormal muscle tone.     Coordination: Coordination normal.     Comments:  5/5 strength throughout. CN 2-12 intact.Equal grip strength.   Psychiatric:        Behavior: Behavior normal.     ED Results / Procedures / Treatments   Labs (all labs ordered are listed, but only abnormal results are displayed) Labs Reviewed  COMPREHENSIVE METABOLIC PANEL WITH GFR - Abnormal; Notable for the following components:      Result Value   Total Protein 6.3 (*)    All other components within normal limits  BRAIN NATRIURETIC PEPTIDE - Abnormal; Notable for the following components:   B Natriuretic Peptide 242.9 (*)    All other components within normal limits  D-DIMER, QUANTITATIVE - Abnormal; Notable for the following components:   D-Dimer, Quant 1.70 (*)    All other components within normal limits  TROPONIN I (HIGH SENSITIVITY) -  Abnormal; Notable for the following components:   Troponin I (High Sensitivity) 71 (*)    All other components within normal limits  TROPONIN I (HIGH SENSITIVITY) - Abnormal; Notable for the following components:   Troponin I (High Sensitivity) 83 (*)    All other components within normal limits  RESP PANEL BY RT-PCR (RSV, FLU A&B, COVID)  [id number]  CBC WITH DIFFERENTIAL/PLATELET  TROPONIN I (HIGH SENSITIVITY)    EKG EKG Interpretation Date/Time:  Tuesday April 02 2024 19:03:58 EDT Ventricular Rate:  107 PR Interval:  158 QRS Duration:  78 QT Interval:  334 QTC Calculation: 445 R Axis:   60  Text Interpretation: Sinus tachycardia with occasional Premature ventricular complexes Otherwise normal ECG  PVC othewise unchanged from prior EKG Confirmed by Celesta Coke ([id number]) on 04/02/2024 10:27:31 PM  Radiology CT Angio Chest PE W and/or Wo Contrast Result Date: 04/03/2024 CLINICAL DATA:  Shortness of breath for 3 days. EXAM: CT ANGIOGRAPHY CHEST WITH CONTRAST TECHNIQUE: Multidetector CT imaging of the chest was performed using the standard protocol during bolus administration of intravenous contrast. Multiplanar CT image reconstructions and MIPs were obtained to evaluate the vascular anatomy. RADIATION DOSE REDUCTION: This exam was performed according to the departmental dose-optimization program which includes automated exposure control, adjustment of the mA and/or kV according to patient size and/or use of iterative reconstruction technique. CONTRAST:  75mL OMNIPAQUE  IOHEXOL  350 MG/ML SOLN COMPARISON:  None Available. FINDINGS: Cardiovascular: There is mild calcification of the aortic arch, without evidence of aortic aneurysm. Satisfactory opacification of the pulmonary arteries to the segmental level. No evidence of pulmonary embolism. Normal heart size. No pericardial effusion. Mediastinum/Nodes: No enlarged mediastinal, hilar, or axillary lymph nodes. Thyroid gland, trachea, and esophagus  demonstrate no significant findings. Lungs/Pleura: There is mild diffuse interstitial thickening. Small multifocal right upper lobe infiltrates are seen. Moderate severity posterior right basilar and mild posterior left basilar atelectasis and/or infiltrate is also seen. There are small bilateral pleural effusions. No pneumothorax is identified. Upper Abdomen: There is a small hiatal hernia. Musculoskeletal: No chest wall abnormality. No acute or significant osseous findings. Review of the MIP images confirms the above findings. IMPRESSION: 1. No evidence of pulmonary embolism. 2. Mild multifocal right upper lobe infiltrates. 3. Mild to moderate severity bibasilar atelectasis and/or infiltrate, right greater than left. 4. Interstitial thickening which may represent sequelae associated with interstitial edema. 5. Small bilateral pleural effusions. Electronically Signed   By: Virgle Grime M.D.   On: 04/03/2024 03:59   DG Chest 2 View Result Date: 04/02/2024 CLINICAL DATA:  Shortness of breath, cough. EXAM: CHEST - 2 VIEW COMPARISON:  October 29, 2012. FINDINGS: The heart size and mediastinal contours are within normal limits. Both lungs are clear. The visualized skeletal structures are unremarkable. IMPRESSION: No active cardiopulmonary disease. Electronically Signed   By: Rosalene Colon M.D.   On: 04/02/2024 16:37    Procedures Procedures    Medications Ordered in ED Medications - No data to display  ED Course/ Medical Decision Making/ A&P                                 Medical Decision Making Amount and/or Complexity of Data Reviewed Labs: ordered. Decision-making details documented in ED Course. Radiology: ordered and independent interpretation performed. Decision-making details documented in ED Course. ECG/medicine tests: ordered and independent interpretation performed. Decision-making details documented in ED Course.  Risk Prescription drug management. Decision regarding  hospitalization.   3 days of shortness of breath worse with exertion worse with lying flat.  No chest pain.  No increased work of breathing at rest.  98% on room air with clear lungs with minimal rales.  X-ray urgent care was negative for infiltrate.  EKG shows no acute ischemia.  Concern for new onset CHF.  The patient is hypertensive.  Mid 90s on room air dropped to the mid 80s with exertion.  Denies chest pain.  IV  Lasix  is given.  Troponin minimally elevated 71 and 83 likely secondary to hypertensive urgency and CHF exacerbation.  BNP is 243.  D-dimer was positive and CT scan was done to rule out pulmonary embolism.  On attempted ambulation, patient became short of breath and desaturated to 87%.  Will plan admission for new onset CHF.  CT negative for pulmonary embolism.  Does show bilateral infiltrates and interstitial edema.  Suspect this is likely fluid rather than infection.  Will give pneumonia antibiotics however.  Will give IV Lasix .  Admission d/w Dr. Brice Campi.         Final Clinical Impression(s) / ED Diagnoses Final diagnoses:  None    Rx / DC Orders ED Discharge Orders     None         Adi Seales, Mara [place]in[place], MD 04/03/24 0500

## 2024-04-03 NOTE — ED Notes (Signed)
 Nt called CCMD to put pt on monitor

## 2024-04-03 NOTE — ED Notes (Signed)
Patient and visitor updated on plan of care ?

## 2024-04-03 NOTE — H&P (Signed)
 History and Physical    Patient: Eric Frederick ZOX:[id number] DOB: 1966-07-20 DOA: 04/02/2024 DOS: the patient was seen and examined on 04/03/2024 PCP: Scherrie Curt, MD  Patient coming from: Home  Chief Complaint:  Chief Complaint  Patient presents with   Shortness of Breath   HPI: ARDELL Frederick is a 58 y.o. male with medical history significant of diabetes mellitus 2, dyslipidemia, hypertension,, tobacco use and obesity with a BMI of 46.  Patient reports symptoms began this past Sunday with shortness of breath.  Was awakened during the night Sunday into Monday with a dry cough and wheezing.  Subsequently he noticed dyspnea on exertion but no peripheral edema.  He has not had fevers or chills or sick contacts.  He has had immunizations for COVID, pneumococcal pneumonia and shingles.  He has not had RSV vaccination.  Upon presentation to the ED he was slightly tachycardic, normotensive, afebrile and O2 sats were 96.  Abnormal labs include BNP of 242, troponin 71-83-64.  He did not have leukocytosis and PCR for COVID, influenza and RSV were negative.  Chest x-ray was negative.  Due to ongoing shortness of breath associated with an elevated D-dimer there were concerns for possible PE therefore CTA of the chest was obtained.  This revealed no evidence of PE but there were mild multifocal right upper lobe infiltrates with mild to moderate bibasilar atelectasis or infiltrates right greater than left.  There were also small bilateral pleural effusions as well as interstitial thickening that could be associated with interstitial edema.  EDP suspected combination of CHF and community-acquired pneumonia.  He has been given Lasix  20 mg IV x 1 with a total of 2100 cc of urine output since that time.  He has also been started on IV Rocephin  and Zithromax  to treat pneumonia.  Hospitalist service has been asked to evaluate the patient for admission.   Review of Systems: As mentioned in the history of present  illness. All other systems reviewed and are negative.   Past Medical History:  Diagnosis Date   Diabetes mellitus    Diverticulosis    GERD (gastroesophageal reflux disease)    Hiatal hernia 04/22/2015   Hyperlipidemia    Hypertension    Serrated adenoma of colon 06/2016   Past Surgical History:  Procedure Laterality Date   KNEE ARTHROPLASTY Right    2023   SHOULDER ARTHROSCOPY W/ ROTATOR CUFF REPAIR Right 12/12/2012   [name]   TOTAL KNEE ARTHROPLASTY Left    2022   Social History:  reports that he quit smoking about 33 years ago. His smoking use included cigarettes. He has never used smokeless tobacco. He reports that he does not drink alcohol and does not use drugs.  Allergies  Allergen Reactions   Rosuvastatin  Hives   Glipizide  Hives, Itching and Rash    Family History  Problem Relation Age of Onset   Other Father        complications from agent orange   Colon cancer Neg Hx    Rectal cancer Neg Hx    Stomach cancer Neg Hx    Esophageal cancer Neg Hx     Prior to Admission medications   Medication Sig Start Date End Date Taking? Authorizing Provider  amoxicillin (AMOXIL) 500 MG capsule Take 1,000 mg by mouth 2 (two) times daily. 03/13/24  Yes [provider]  ascorbic acid (VITAMIN C) 1000 MG tablet Take 1,000 mg by mouth daily.   Yes [provider]  Cinnamon 500 MG TABS  Take 1,000 mg by mouth daily.   Yes [provider]  doxycycline  (VIBRA -TABS) 100 MG tablet Take 1 tablet (100 mg total) by mouth 2 (two) times daily for 7 days. 04/02/24 04/09/24 Yes Kaur, Charanpreet, NP  ibuprofen (ADVIL) 200 MG tablet Take 600 mg by mouth every 6 (six) hours as needed for headache or mild pain (pain score 1-3) (back pain).   Yes [provider]  lisinopril  (ZESTRIL ) 40 MG tablet Take 1 tablet (40 mg total) by mouth daily. Patient taking differently: Take 40 mg by mouth at bedtime. 11/22/23  Yes Copland, Jolena Nay, MD  metFORMIN  (GLUCOPHAGE ) 1000  MG tablet Take 1 tablet (1,000 mg total) by mouth 2 (two) times daily. 11/22/23  Yes Copland, Jolena Nay, MD  Multiple Vitamins-Minerals (MULTIVITAMIN MEN 50+) TABS Take 1 tablet by mouth daily.   Yes [provider]  omeprazole  (PRILOSEC) 40 MG capsule Take 1 capsule (40 mg total) by mouth daily. 11/22/23  Yes Copland, Jolena Nay, MD  predniSONE  (DELTASONE ) 10 MG tablet Take 4 tablets ( total 40 mg) by mouth for 2 days; take 3 tablets ( total 30 mg) by mouth for 2 days; take 2 tablets ( total 20 mg) by mouth for 1 day; take 1 tablet ( total 10 mg) by mouth for 1 day. 04/02/24  Yes Kaur, Charanpreet, NP  sildenafil  (VIAGRA ) 100 MG tablet Take 0.5-1 tablets (50-100 mg total) by mouth daily as needed for erectile dysfunction. 11/22/23  Yes Copland, Jolena Nay, MD  tirzepatide [facility]) 10 MG/0.5ML Pen Inject 10 mg into the skin once a week. 11/22/23  Yes Copland, Jolena Nay, MD  albuterol  (VENTOLIN  HFA) 108 (90 Base) MCG/ACT inhaler Inhale 2 puffs into the lungs every 6 (six) hours as needed for wheezing or shortness of breath. 04/02/24   Tona Francis, NP  SUFLAVE  178.7 g SOLR SMARTSIG:1 Kit(s) By Mouth Once Patient not taking: Reported on 04/03/2024 03/18/24   [provider]    Physical Exam: Vitals:   04/03/24 0600 04/03/24 0630 04/03/24 0645 04/03/24 0845  BP: (!) 163/94 (!) 171/101 (!) 182/92   Pulse: 95 94 94   Resp: 20 16 19    Temp:    99 F (37.2 C)  TempSrc:    Oral  SpO2: 96% 97% 95%   Weight:      Height:       Constitutional: NAD, calm, comfortable Respiratory: Lung sounds are essentially unremarkable with few scattered crackles on posterior and.  Normal respiratory effort. No accessory muscle use.  Room air Cardiovascular: Regular rate and rhythm, no murmurs / rubs / gallops.  Trace bilateral lower extremity edema. 2+ pedal pulses.   Abdomen: no tenderness, no masses palpated. No hepatosplenomegaly. Bowel sounds positive.  Musculoskeletal: no clubbing / cyanosis. No  joint deformity upper and lower extremities. Good ROM, no contractures. Normal muscle tone.  Skin: no rashes, lesions, ulcers. No induration Neurologic: CN 2-12 grossly intact. Sensation intact, Strength 5/5 x all 4 extremities.  Psychiatric: Normal judgment and insight. Alert and oriented x 3. Normal mood.    Data Reviewed:  Initial labs: Sodium 144, potassium 3.8, chloride 110, CO2 24, glucose 94, BUN 11, creatinine 0.76, LFTs are normal  BNP 242.9, troponin 71-83-64-64  WBC 8000 with normal differential, hemoglobin 13.8, platelets 263,000  D dimer 1.7  Follow-up labs today: Sodium 141, potassium 3.9, chloride 103, CO2 26, glucose 116, BUN 8, creatinine 0.79, GFR greater than 60  Assessment and Plan: Acute dyspnea multifactorial: Possible new onset CHF Right-sided community-acquired pneumonia Patient responded  well to IV Lasix  with 2100 cc urine output-will wait on echocardiogram results before ordering additional Lasix  noting patient only has trace lower extremity edema Echocardiogram ordered Also has findings concerning for possible community-acquired pneumonia so we will continue IV Rocephin  and Zithromax , check procalcitonin and CRP Daily weights with strict intake and output Flutter valve No wheezing but as a precaution we will allow for DuoNebs every 6 hours as needed for shortness of breath  Diabetes mellitus 2 Hold metformin  since received IV contrast for CTA chest On Mounjaro at home and doses every Friday Follow CBGs and provide SSI Check hemoglobin A1c: 4.9  Hypertension Continue Zestril   Dyslipidemia Not on pharmacotherapy  Obesity Continue Mounjaro-currently on 10 mg dosage  Ongoing tobacco use Counseled regarding cessation    Advance Care Planning:   Code Status: Full Code   VTE prophylaxis: Lovenox   Consults: None  Family Communication: Wife at bedside  Severity of Illness: The appropriate patient status for this patient is INPATIENT.  Inpatient status is judged to be reasonable and necessary in order to provide the required intensity of service to ensure the patient's safety. The patient's presenting symptoms, physical exam findings, and initial radiographic and laboratory data in the context of their chronic comorbidities is felt to place them at high risk for further clinical deterioration. Furthermore, it is not anticipated that the patient will be medically stable for discharge from the hospital within 2 midnights of admission.   * I certify that at the point of admission it is my clinical judgment that the patient will require inpatient hospital care spanning beyond 2 midnights from the point of admission due to high intensity of service, high risk for further deterioration and high frequency of surveillance required.*  Author: Kathye Parkin, NP 04/03/2024 9:32 AM  For on call review [email or web address].

## 2024-04-04 ENCOUNTER — Inpatient Hospital Stay (HOSPITAL_COMMUNITY)

## 2024-04-04 DIAGNOSIS — I5031 Acute diastolic (congestive) heart failure: Secondary | ICD-10-CM | POA: Diagnosis not present

## 2024-04-04 DIAGNOSIS — I509 Heart failure, unspecified: Secondary | ICD-10-CM | POA: Diagnosis not present

## 2024-04-04 LAB — CBC
HCT: 45.5 % (ref 39.0–52.0)
Hemoglobin: 14.6 g/dL (ref 13.0–17.0)
MCH: 29.4 pg (ref 26.0–34.0)
MCHC: 32.1 g/dL (ref 30.0–36.0)
MCV: 91.5 fL (ref 80.0–100.0)
Platelets: 296 10*3/uL (ref 150–400)
RBC: 4.97 MIL/uL (ref 4.22–5.81)
RDW: 12.6 % (ref 11.5–15.5)
WBC: 6.6 10*3/uL (ref 4.0–10.5)
nRBC: 0 % (ref 0.0–0.2)

## 2024-04-04 LAB — ECHOCARDIOGRAM COMPLETE
AR max vel: 2.12 cm2
AV Area VTI: 2.68 cm2
AV Area mean vel: 2.33 cm2
AV Mean grad: 2 mmHg
AV Peak grad: 4.8 mmHg
Ao pk vel: 1.1 m/s
Area-P 1/2: 3.03 cm2
Height: 64.567 in
S' Lateral: 3.1 cm
Single Plane A4C EF: 66.4 %
Weight: 4197.56 [oz_av]

## 2024-04-04 LAB — GLUCOSE, CAPILLARY
Glucose-Capillary: 150 mg/dL — ABNORMAL HIGH (ref 70–99)
Glucose-Capillary: 136 mg/dL — ABNORMAL HIGH (ref 70–99)
Glucose-Capillary: 133 mg/dL — ABNORMAL HIGH (ref 70–99)

## 2024-04-04 LAB — MAGNESIUM: Magnesium: 1.8 mg/dL (ref 1.7–2.4)

## 2024-04-04 LAB — BASIC METABOLIC PANEL WITH GFR
Anion gap: 11 (ref 5–15)
BUN: 17 mg/dL (ref 6–20)
CO2: 23 mmol/L (ref 22–32)
Calcium: 8.6 mg/dL — ABNORMAL LOW (ref 8.9–10.3)
Chloride: 107 mmol/L (ref 98–111)
Creatinine, Ser: 0.88 mg/dL (ref 0.61–1.24)
GFR, Estimated: >60 mL/min (ref >60)
Glucose, Bld: 135 mg/dL — ABNORMAL HIGH (ref 70–99)
Potassium: 4.2 mmol/L (ref 3.5–5.1)
Sodium: 141 mmol/L (ref 135–145)

## 2024-04-04 LAB — CBG MONITORING, ED: Glucose-Capillary: 137 mg/dL — ABNORMAL HIGH (ref 70–99)

## 2024-04-04 MED ORDER — EMPAGLIFLOZIN 10 MG PO TABS
10.0000 mg | ORAL_TABLET | Freq: Every day | ORAL | Status: DC
  Administered 2024-04-04 – 2024-04-06 (×3): 10 mg via ORAL
  Filled 2024-04-04 (×3): qty 1

## 2024-04-04 MED ORDER — FUROSEMIDE 10 MG/ML IJ SOLN
40.0000 mg | Freq: Two times a day (BID) | INTRAMUSCULAR | Status: DC
  Administered 2024-04-04 – 2024-04-06 (×5): 40 mg via INTRAVENOUS
  Filled 2024-04-04 (×5): qty 4

## 2024-04-04 MED ORDER — PERFLUTREN LIPID MICROSPHERE
1.0000 mL | INTRAVENOUS | Status: AC | PRN
  Administered 2024-04-04: 2 mL via INTRAVENOUS

## 2024-04-04 NOTE — TOC CM/SW Note (Signed)
 Transition of Care [facility]) - Inpatient Brief Assessment   Patient Details  Name: Eric Frederick MRN: [id number] Date of Birth: 1966/06/24  Transition of Care [facility]) CM/SW Contact:    Arron Big, LCSWA Phone Number: 04/04/2024, 2:40 PM   Clinical Narrative: CSW introduced self/role to patient and wife at bedside.  Patient stated he lives at home with his wife. Patient has reliable transportation. Patient has a PCP and uses Psychologist, forensic. Patient reports no prior hx of HH or SNF. Patient reports the following DME: RW.   TOC will continue to follow.     Transition of Care Asessment: Insurance and Status: Insurance coverage has been reviewed Patient has primary care physician: Yes Home environment has been reviewed: Home with wife Prior level of function:: Independent Prior/Current Home Services: No current home services Social Drivers of Health Review: SDOH reviewed no interventions necessary Readmission risk has been reviewed: No Transition of care needs: no transition of care needs at this time

## 2024-04-04 NOTE — Progress Notes (Signed)
*  PRELIMINARY RESULTS* Echocardiogram 2D Echocardiogram has been performed.  Eric Frederick L Eric Frederick RDCS 04/04/2024, 3:16 PM

## 2024-04-04 NOTE — Progress Notes (Signed)
 PROGRESS NOTE    Eric Frederick  ZOX:[id number] DOB: 24-Feb-1966 DOA: 04/02/2024 PCP: Scherrie Curt, MD  57/M with history of type 2 diabetes mellitus, dyslipidemia, hypertension, obesity presented to the ED with dyspnea on exertion for 3 to 4 days, associated with dry cough, wheezing, scant edema.  Presented to the ED, vital signs stable, BNP 242, troponin 71, 83, chest x-ray was unremarkable, D-dimer elevated, CTA chest was negative for PE concern for multifocal upper lobe infiltrates with mild bibasilar atelectasis or infiltrates and small bilateral pleural effusions as well as interstitial thickening which could be associated with edema - Admitted, started on diuretics  Subjective: -Feels better, breathing is improving, waiting for echo  Assessment and Plan:  Acute CHF, new diagnosis - Echo pending - Continue IV Lasix  today, hold lisinopril , start Jardiance  - With longstanding diabetes and hypertension he could have ICM - Monitor I's/O, daily weights - Dietitian consult  Diabetes mellitus 2 Hold metformin  since received IV contrast for CTA chest On Mounjaro at home and doses every Friday - hemoglobin A1c: 4.9   Hypertension - Hold Zestril , monitor on diuretics   Dyslipidemia Not on pharmacotherapy   Obesity Continue Mounjaro-currently on 10 mg dosage   DVT prophylaxis: Lovenox  Code Status: Full code Family Communication: Family at bedside Disposition Plan:   Consultants:    Procedures:   Antimicrobials:    Objective: Vitals:   04/04/24 0430 04/04/24 0800 04/04/24 0810 04/04/24 0900  BP:  136/84  (!) 154/81  Pulse:  69  78  Resp:  10  (!) 23  Temp: 98.4 F (36.9 C)  98.4 F (36.9 C)   TempSrc: Oral  Oral   SpO2:  99%  100%  Weight:      Height:        Intake/Output Summary (Last 24 hours) at 04/04/2024 1020 Last data filed at 04/03/2024 2020 Gross per 24 hour  Intake --  Output 825 ml  Net -825 ml   Filed Weights   04/02/24 1904  Weight:  124.1 kg    Examination:  General exam: Obese male sitting up in bed, AAO x 3 HEENT: Neck obese unable to assess JVD Respiratory system: Decreased breath sounds at the bases otherwise clear Cardiovascular system: S1 & S2 heard, RRR.  Abd: nondistended, soft and nontender.Normal bowel sounds heard. Central nervous system: Alert and oriented. No focal neurological deficits. Extremities: Trace edema Skin: No rashes Psychiatry:  Mood & affect appropriate.     Data Reviewed:   CBC: Recent Labs  Lab 04/02/24 1913 04/04/24 0438  WBC 8.0 6.6  NEUTROABS 5.1  --   HGB 13.8 14.6  HCT 41.8 45.5  MCV 90.5 91.5  PLT 263 296   Basic Metabolic Panel: Recent Labs  Lab 04/02/24 1913 04/03/24 0808 04/04/24 0438  NA 144 141 141  K 3.8 3.9 4.2  CL 110 103 107  CO2 24 26 23   GLUCOSE 94 116* 135*  BUN 11 8 17   CREATININE 0.76 0.79 0.88  CALCIUM  9.4 9.1 8.6*  MG  --   --  1.8   GFR: Estimated Creatinine Clearance: 112 mL/min (by C-G formula based on SCr of 0.88 mg/dL). Liver Function Tests: Recent Labs  Lab 04/02/24 1913  AST 24  ALT 39  ALKPHOS 79  BILITOT 1.0  PROT 6.3*  ALBUMIN 3.8   No results for input(s): "LIPASE", "AMYLASE" in the last 168 hours. No results for input(s): "AMMONIA" in the last 168 hours. Coagulation Profile: No results for input(s): "INR", "  PROTIME" in the last 168 hours. Cardiac Enzymes: No results for input(s): "CKTOTAL", "CKMB", "CKMBINDEX", "TROPONINI" in the last 168 hours. BNP (last 3 results) No results for input(s): "PROBNP" in the last 8760 hours. HbA1C: Recent Labs    04/03/24 0808  HGBA1C 4.9   CBG: Recent Labs  Lab 04/03/24 0752 04/03/24 1223 04/03/24 1731 04/03/24 2215 04/04/24 0804  GLUCAP 163* 203* 172* 143* 137*   Lipid Profile: No results for input(s): "CHOL", "HDL", "LDLCALC", "TRIG", "CHOLHDL", "LDLDIRECT" in the last 72 hours. Thyroid Function Tests: No results for input(s): "TSH", "T4TOTAL", "FREET4", "T3FREE",  "THYROIDAB" in the last 72 hours. Anemia Panel: No results for input(s): "VITAMINB12", "FOLATE", "FERRITIN", "TIBC", "IRON", "RETICCTPCT" in the last 72 hours. Urine analysis:    Component Value Date/Time   COLORURINE YELLOW 07/12/2021 1632   APPEARANCEUR CLEAR 07/12/2021 1632   APPEARANCEUR Hazy 10/29/2012 0146   LABSPEC 1.020 07/12/2021 1632   LABSPEC 1.031 10/29/2012 0146   PHURINE 6.0 07/12/2021 1632   GLUCOSEU NEGATIVE 07/12/2021 1632   HGBUR NEGATIVE 07/12/2021 1632   BILIRUBINUR NEGATIVE 07/12/2021 1632   BILIRUBINUR Negative 10/29/2012 0146   KETONESUR NEGATIVE 07/12/2021 1632   PROTEINUR 30 mg/dL 16/09/9603 5409   UROBILINOGEN 0.2 07/12/2021 1632   NITRITE NEGATIVE 07/12/2021 1632   LEUKOCYTESUR NEGATIVE 07/12/2021 1632   LEUKOCYTESUR Negative 10/29/2012 0146   Sepsis Labs: @LABRCNTIP (procalcitonin:4,lacticidven:4)  ) Recent Results (from the past 240 hours)  Resp panel by RT-PCR (RSV, Flu A&B, Covid) Anterior Nasal Swab     Status: None   Collection Time: 04/02/24  7:16 PM   Specimen: Anterior Nasal Swab  Result Value Ref Range Status   SARS Coronavirus 2 by RT PCR NEGATIVE NEGATIVE Final   Influenza A by PCR NEGATIVE NEGATIVE Final   Influenza B by PCR NEGATIVE NEGATIVE Final    Comment: (NOTE) The Xpert Xpress SARS-CoV-2/FLU/RSV plus assay is intended as an aid in the diagnosis of influenza from Nasopharyngeal swab specimens and should not be used as a sole basis for treatment. Nasal washings and aspirates are unacceptable for Xpert Xpress SARS-CoV-2/FLU/RSV testing.  Fact Sheet for Patients: BloggerCourse.com  Fact Sheet for Healthcare Providers: SeriousBroker.it  This test is not yet approved or cleared by the [place]  FDA and has been authorized for detection and/or diagnosis of SARS-CoV-2 by FDA under an Emergency Use Authorization (EUA). This EUA will remain in effect (meaning this test can be  used) for the duration of the COVID-19 declaration under Section 564(b)(1) of the Act, 21 U.S.C. section 360bbb-3(b)(1), unless the authorization is terminated or revoked.     Resp Syncytial Virus by PCR NEGATIVE NEGATIVE Final    Comment: (NOTE) Fact Sheet for Patients: BloggerCourse.com  Fact Sheet for Healthcare Providers: SeriousBroker.it  This test is not yet approved or cleared by the [place]  FDA and has been authorized for detection and/or diagnosis of SARS-CoV-2 by FDA under an Emergency Use Authorization (EUA). This EUA will remain in effect (meaning this test can be used) for the duration of the COVID-19 declaration under Section 564(b)(1) of the Act, 21 U.S.C. section 360bbb-3(b)(1), unless the authorization is terminated or revoked.  Performed at [facility], [address], [place], [place] [address]      Radiology Studies: CT Angio Chest PE W and/or Wo Contrast Result Date: 04/03/2024 CLINICAL DATA:  Shortness of breath for 3 days. EXAM: CT ANGIOGRAPHY CHEST WITH CONTRAST TECHNIQUE: Multidetector CT imaging of the chest was performed using the standard protocol during bolus administration of  intravenous contrast. Multiplanar CT image reconstructions and MIPs were obtained to evaluate the vascular anatomy. RADIATION DOSE REDUCTION: This exam was performed according to the departmental dose-optimization program which includes automated exposure control, adjustment of the mA and/or kV according to patient size and/or use of iterative reconstruction technique. CONTRAST:  75mL OMNIPAQUE  IOHEXOL  350 MG/ML SOLN COMPARISON:  None Available. FINDINGS: Cardiovascular: There is mild calcification of the aortic arch, without evidence of aortic aneurysm. Satisfactory opacification of the pulmonary arteries to the segmental level. No evidence of pulmonary embolism. Normal heart size. No pericardial effusion. Mediastinum/Nodes:  No enlarged mediastinal, hilar, or axillary lymph nodes. Thyroid gland, trachea, and esophagus demonstrate no significant findings. Lungs/Pleura: There is mild diffuse interstitial thickening. Small multifocal right upper lobe infiltrates are seen. Moderate severity posterior right basilar and mild posterior left basilar atelectasis and/or infiltrate is also seen. There are small bilateral pleural effusions. No pneumothorax is identified. Upper Abdomen: There is a small hiatal hernia. Musculoskeletal: No chest wall abnormality. No acute or significant osseous findings. Review of the MIP images confirms the above findings. IMPRESSION: 1. No evidence of pulmonary embolism. 2. Mild multifocal right upper lobe infiltrates. 3. Mild to moderate severity bibasilar atelectasis and/or infiltrate, right greater than left. 4. Interstitial thickening which may represent sequelae associated with interstitial edema. 5. Small bilateral pleural effusions. Electronically Signed   By: Virgle Grime M.D.   On: 04/03/2024 03:59   DG Chest 2 View Result Date: 04/02/2024 CLINICAL DATA:  Shortness of breath, cough. EXAM: CHEST - 2 VIEW COMPARISON:  October 29, 2012. FINDINGS: The heart size and mediastinal contours are within normal limits. Both lungs are clear. The visualized skeletal structures are unremarkable. IMPRESSION: No active cardiopulmonary disease. Electronically Signed   By: Rosalene Colon M.D.   On: 04/02/2024 16:37     Scheduled Meds:  enoxaparin  (LOVENOX ) injection  40 mg Subcutaneous Q24H   furosemide   40 mg Intravenous BID   insulin  aspart  0-15 Units Subcutaneous TID WC   insulin  aspart  0-5 Units Subcutaneous QHS   lisinopril   40 mg Oral QHS   pantoprazole   40 mg Oral Daily   polyethylene glycol  17 g Oral Daily   senna  1 tablet Oral QHS   sodium chloride  flush  3 mL Intravenous Q12H   Continuous Infusions:   LOS: 1 day    Time spent:    Deforest Fast, MD [facility]   04/04/2024, 10:20 AM

## 2024-04-05 ENCOUNTER — Encounter (HOSPITAL_COMMUNITY): Payer: Self-pay | Admitting: Family Medicine

## 2024-04-05 DIAGNOSIS — I509 Heart failure, unspecified: Secondary | ICD-10-CM | POA: Diagnosis not present

## 2024-04-05 DIAGNOSIS — I5030 Unspecified diastolic (congestive) heart failure: Secondary | ICD-10-CM | POA: Diagnosis not present

## 2024-04-05 DIAGNOSIS — I472 Ventricular tachycardia, unspecified: Secondary | ICD-10-CM

## 2024-04-05 LAB — MAGNESIUM: Magnesium: 2.2 mg/dL (ref 1.7–2.4)

## 2024-04-05 LAB — BASIC METABOLIC PANEL WITH GFR
Anion gap: 11 (ref 5–15)
BUN: 20 mg/dL (ref 6–20)
CO2: 28 mmol/L (ref 22–32)
Calcium: 8.8 mg/dL — ABNORMAL LOW (ref 8.9–10.3)
Chloride: 103 mmol/L (ref 98–111)
Creatinine, Ser: 1 mg/dL (ref 0.61–1.24)
GFR, Estimated: >60 mL/min (ref >60)
Glucose, Bld: 134 mg/dL — ABNORMAL HIGH (ref 70–99)
Potassium: 4 mmol/L (ref 3.5–5.1)
Sodium: 142 mmol/L (ref 135–145)

## 2024-04-05 LAB — PHOSPHORUS: Phosphorus: 5.4 mg/dL — ABNORMAL HIGH (ref 2.5–4.6)

## 2024-04-05 LAB — GLUCOSE, CAPILLARY
Glucose-Capillary: 106 mg/dL — ABNORMAL HIGH (ref 70–99)
Glucose-Capillary: 140 mg/dL — ABNORMAL HIGH (ref 70–99)
Glucose-Capillary: 138 mg/dL — ABNORMAL HIGH (ref 70–99)
Glucose-Capillary: 116 mg/dL — ABNORMAL HIGH (ref 70–99)

## 2024-04-05 MED ORDER — MAGNESIUM SULFATE 2 GM/50ML IV SOLN
2.0000 g | Freq: Once | INTRAVENOUS | Status: AC
  Administered 2024-04-05: 2 g via INTRAVENOUS
  Filled 2024-04-05: qty 50

## 2024-04-05 MED ORDER — LOSARTAN POTASSIUM 25 MG PO TABS
25.0000 mg | ORAL_TABLET | Freq: Every day | ORAL | Status: DC
  Administered 2024-04-05 – 2024-04-06 (×2): 25 mg via ORAL
  Filled 2024-04-05 (×2): qty 1

## 2024-04-05 MED ORDER — CARVEDILOL 6.25 MG PO TABS
6.2500 mg | ORAL_TABLET | Freq: Two times a day (BID) | ORAL | Status: DC
  Administered 2024-04-05 – 2024-04-06 (×3): 6.25 mg via ORAL
  Filled 2024-04-05 (×3): qty 1

## 2024-04-05 MED ORDER — SPIRONOLACTONE 12.5 MG HALF TABLET
12.5000 mg | ORAL_TABLET | Freq: Every day | ORAL | Status: DC
  Administered 2024-04-05 – 2024-04-06 (×2): 12.5 mg via ORAL
  Filled 2024-04-05 (×3): qty 1

## 2024-04-05 NOTE — Progress Notes (Signed)
 PROGRESS NOTE    Eric Frederick  NIO:[id number] DOB: 04/17/1966 DOA: 04/02/2024 PCP: Scherrie Curt, MD  57/M with history of type 2 diabetes mellitus, dyslipidemia, hypertension, obesity presented to the ED with dyspnea on exertion for 3 to 4 days, associated with dry cough, wheezing, scant edema.  In the ED> VSS, BNP 242, troponin 71, 83, chest x-ray was unremarkable, D-dimer elevated, CTA chest was negative for PE concern for multifocal upper lobe infiltrates with mild bibasilar atelectasis or infiltrates and small bilateral pleural effusions as well as interstitial thickening which could be associated with edema - Admitted, started on diuretics, with EF 50-55%, grade 1 DD, normal RV - 4/25 AM with 24 beat run of V. tach  Subjective: - Feels much better, breathing is improving, asymptomatic during V. tach episode this morning  Assessment and Plan:  Acute diastolic CHF, new diagnosis - 2D echo noted EF 50-55%, no wall motion abnormality, grade 1 DD, normal RV -Improving with diuresis, 8 L negative, continue IV Lasix  today, started on Jardiance , BP remains significantly elevated, add losartan  and low-dose Aldactone  - Dietitian consult  Ventricular tachycardia - This morning had 24 beat run of V. tach, asymptomatic, given mag X1, potassium is 4 - Will request cards eval  Diabetes mellitus 2 Metformin  on hold now, resume at DC On Mounjaro , doses every Friday - hemoglobin A1c: 4.9   Hypertension - Meds as above   Obesity BMI is 43.9, recommended diet lifestyle modification On Mounjaro at baseline   DVT prophylaxis: Lovenox  Code Status: Full code Family Communication: Family at bedside Disposition Plan:   Consultants:    Procedures:   Antimicrobials:    Objective: Vitals:   04/04/24 1942 04/05/24 0047 04/05/24 0411 04/05/24 0740  BP: 139/83 (!) 147/86 139/71 (!) 177/94  Pulse: 91 87 83 85  Resp: 18 17 18 18   Temp: 98.6 F (37 C) 98 F (36.7 C) 98.1 F (36.7  C) 98 F (36.7 C)  TempSrc: Oral Oral Oral Oral  SpO2: 93% 94% 96% 94%  Weight:   118.2 kg   Height:        Intake/Output Summary (Last 24 hours) at 04/05/2024 0947 Last data filed at 04/05/2024 0900 Gross per 24 hour  Intake 700 ml  Output 6175 ml  Net -5475 ml   Filed Weights   04/02/24 1904 04/04/24 1147 04/05/24 0411  Weight: 124.1 kg 119 kg 118.2 kg    Examination:  General exam: Obese male sitting up in bed, AAO x 3 HEENT: Neck obese unable to assess JVD Respiratory system: Decreased breath sounds at the bases otherwise clear Cardiovascular system: S1 & S2 heard, RRR.  Abd: nondistended, soft and nontender.Normal bowel sounds heard. Extremities: Trace edema Skin: No rashes Psychiatry:  Mood & affect appropriate.     Data Reviewed:   CBC: Recent Labs  Lab 04/02/24 1913 04/04/24 0438  WBC 8.0 6.6  NEUTROABS 5.1  --   HGB 13.8 14.6  HCT 41.8 45.5  MCV 90.5 91.5  PLT 263 296   Basic Metabolic Panel: Recent Labs  Lab 04/02/24 1913 04/03/24 0808 04/04/24 0438 04/05/24 0323  NA 144 141 141 142  K 3.8 3.9 4.2 4.0  CL 110 103 107 103  CO2 24 26 23 28   GLUCOSE 94 116* 135* 134*  BUN 11 8 17 20   CREATININE 0.76 0.79 0.88 1.00  CALCIUM  9.4 9.1 8.6* 8.8*  MG  --   --  1.8 2.2  PHOS  --   --   --  5.4*   GFR: Estimated Creatinine Clearance: 96.4 mL/min (by C-G formula based on SCr of 1 mg/dL). Liver Function Tests: Recent Labs  Lab 04/02/24 1913  AST 24  ALT 39  ALKPHOS 79  BILITOT 1.0  PROT 6.3*  ALBUMIN 3.8   No results for input(s): "LIPASE", "AMYLASE" in the last 168 hours. No results for input(s): "AMMONIA" in the last 168 hours. Coagulation Profile: No results for input(s): "INR", "PROTIME" in the last 168 hours. Cardiac Enzymes: No results for input(s): "CKTOTAL", "CKMB", "CKMBINDEX", "TROPONINI" in the last 168 hours. BNP (last 3 results) No results for input(s): "PROBNP" in the last 8760 hours. HbA1C: Recent Labs     04/03/24 0808  HGBA1C 4.9   CBG: Recent Labs  Lab 04/04/24 0804 04/04/24 1209 04/04/24 1545 04/04/24 2105 04/05/24 0614  GLUCAP 137* 150* 136* 133* 106*   Lipid Profile: No results for input(s): "CHOL", "HDL", "LDLCALC", "TRIG", "CHOLHDL", "LDLDIRECT" in the last 72 hours. Thyroid Function Tests: No results for input(s): "TSH", "T4TOTAL", "FREET4", "T3FREE", "THYROIDAB" in the last 72 hours. Anemia Panel: No results for input(s): "VITAMINB12", "FOLATE", "FERRITIN", "TIBC", "IRON", "RETICCTPCT" in the last 72 hours. Urine analysis:    Component Value Date/Time   COLORURINE YELLOW 07/12/2021 1632   APPEARANCEUR CLEAR 07/12/2021 1632   APPEARANCEUR Hazy 10/29/2012 0146   LABSPEC 1.020 07/12/2021 1632   LABSPEC 1.031 10/29/2012 0146   PHURINE 6.0 07/12/2021 1632   GLUCOSEU NEGATIVE 07/12/2021 1632   HGBUR NEGATIVE 07/12/2021 1632   BILIRUBINUR NEGATIVE 07/12/2021 1632   BILIRUBINUR Negative 10/29/2012 0146   KETONESUR NEGATIVE 07/12/2021 1632   PROTEINUR 30 mg/dL 16/09/9603 5409   UROBILINOGEN 0.2 07/12/2021 1632   NITRITE NEGATIVE 07/12/2021 1632   LEUKOCYTESUR NEGATIVE 07/12/2021 1632   LEUKOCYTESUR Negative 10/29/2012 0146   Sepsis Labs: @LABRCNTIP (procalcitonin:4,lacticidven:4)  ) Recent Results (from the past 240 hours)  Resp panel by RT-PCR (RSV, Flu A&B, Covid) Anterior Nasal Swab     Status: None   Collection Time: 04/02/24  7:16 PM   Specimen: Anterior Nasal Swab  Result Value Ref Range Status   SARS Coronavirus 2 by RT PCR NEGATIVE NEGATIVE Final   Influenza A by PCR NEGATIVE NEGATIVE Final   Influenza B by PCR NEGATIVE NEGATIVE Final    Comment: (NOTE) The Xpert Xpress SARS-CoV-2/FLU/RSV plus assay is intended as an aid in the diagnosis of influenza from Nasopharyngeal swab specimens and should not be used as a sole basis for treatment. Nasal washings and aspirates are unacceptable for Xpert Xpress SARS-CoV-2/FLU/RSV testing.  Fact Sheet for  Patients: BloggerCourse.com  Fact Sheet for Healthcare Providers: SeriousBroker.it  This test is not yet approved or cleared by the [place]  FDA and has been authorized for detection and/or diagnosis of SARS-CoV-2 by FDA under an Emergency Use Authorization (EUA). This EUA will remain in effect (meaning this test can be used) for the duration of the COVID-19 declaration under Section 564(b)(1) of the Act, 21 U.S.C. section 360bbb-3(b)(1), unless the authorization is terminated or revoked.     Resp Syncytial Virus by PCR NEGATIVE NEGATIVE Final    Comment: (NOTE) Fact Sheet for Patients: BloggerCourse.com  Fact Sheet for Healthcare Providers: SeriousBroker.it  This test is not yet approved or cleared by the [place]  FDA and has been authorized for detection and/or diagnosis of SARS-CoV-2 by FDA under an Emergency Use Authorization (EUA). This EUA will remain in effect (meaning this test can be used) for the duration of the COVID-19 declaration under Section 564(b)(1) of the Act,  21 U.S.C. section 360bbb-3(b)(1), unless the authorization is terminated or revoked.  Performed at [facility], [address] [place], [place] [address]      Radiology Studies: ECHOCARDIOGRAM COMPLETE Result Date: 04/04/2024    ECHOCARDIOGRAM REPORT   Patient Name:   Eric Frederick Date of Exam: 04/04/2024 Medical Rec #:  [id number]      Height:       64.6 in Accession #:    [id number]     Weight:       262.3 lb Date of Birth:  12/09/1966      BSA:          2.209 m Patient Age:    57 years       BP:           163/79 mmHg Patient Gender: M              HR:           89 bpm. Exam Location:  Inpatient Procedure: 2D Echo, Cardiac Doppler, Color Doppler and Intracardiac            Opacification Agent (Both Spectral and Color Flow Doppler were            utilized during procedure). Indications:     CHF-acute diastolic  History:        Patient has no prior history of Echocardiogram examinations.                 Signs/Symptoms:Shortness of Breath; Risk Factors:Diabetes and                 Hypertension.  Sonographer:    Juanita Shaw Referring Phys: [id number] ALLISON L ELLIS IMPRESSIONS  1. Left ventricular ejection fraction, by estimation, is 50 to 55%. The left ventricle has low normal function. The left ventricle has no regional wall motion abnormalities. Left ventricular diastolic parameters are consistent with Grade I diastolic dysfunction (impaired relaxation).  2. Right ventricular systolic function is normal. The right ventricular size is normal.  3. The mitral valve is normal in structure. No evidence of mitral valve regurgitation. No evidence of mitral stenosis.  4. The aortic valve is normal in structure. Aortic valve regurgitation is mild. No aortic stenosis is present.  5. The inferior vena cava is normal in size with greater than 50% respiratory variability, suggesting right atrial pressure of 3 mmHg. FINDINGS  Left Ventricle: Left ventricular ejection fraction, by estimation, is 50 to 55%. The left ventricle has low normal function. The left ventricle has no regional wall motion abnormalities. The left ventricular internal cavity size was normal in size. There is no left ventricular hypertrophy. Left ventricular diastolic parameters are consistent with Grade I diastolic dysfunction (impaired relaxation). Right Ventricle: The right ventricular size is normal. No increase in right ventricular wall thickness. Right ventricular systolic function is normal. Left Atrium: Left atrial size was normal in size. Right Atrium: Right atrial size was normal in size. Pericardium: There is no evidence of pericardial effusion. Mitral Valve: The mitral valve is normal in structure. No evidence of mitral valve regurgitation. No evidence of mitral valve stenosis. Tricuspid Valve: The tricuspid valve is normal in structure.  Tricuspid valve regurgitation is not demonstrated. No evidence of tricuspid stenosis. Aortic Valve: The aortic valve is normal in structure. Aortic valve regurgitation is mild. No aortic stenosis is present. Aortic valve mean gradient measures 2.0 mmHg. Aortic valve peak gradient measures 4.8 mmHg. Aortic valve area, by VTI measures 2.68 cm.  Pulmonic Valve: The pulmonic valve was normal in structure. Pulmonic valve regurgitation is not visualized. No evidence of pulmonic stenosis. Aorta: The aortic root is normal in size and structure. Venous: The inferior vena cava is normal in size with greater than 50% respiratory variability, suggesting right atrial pressure of 3 mmHg. IAS/Shunts: No atrial level shunt detected by color flow Doppler.  LEFT VENTRICLE PLAX 2D LVIDd:         4.40 cm      Diastology LVIDs:         3.10 cm      LV e' medial:    7.07 cm/s LV PW:         1.40 cm      LV E/e' medial:  7.7 LV IVS:        1.20 cm      LV e' lateral:   7.83 cm/s LVOT diam:     2.00 cm      LV E/e' lateral: 6.9 LV SV:         40 LV SV Index:   18 LVOT Area:     3.14 cm  LV Volumes (MOD) LV vol d, MOD A4C: 200.0 ml LV vol s, MOD A4C: 67.2 ml LV SV MOD A4C:     200.0 ml RIGHT VENTRICLE            IVC RV Basal diam:  3.80 cm    IVC diam: 1.00 cm RV S prime:     9.14 cm/s LEFT ATRIUM             Index        RIGHT ATRIUM          Index LA diam:        2.90 cm 1.31 cm/m   RA Area:     8.55 cm LA Vol (A2C):   31.6 ml 14.30 ml/m  RA Volume:   13.60 ml 6.16 ml/m LA Vol (A4C):   47.6 ml 21.55 ml/m LA Biplane Vol: 39.4 ml 17.83 ml/m  AORTIC VALVE                    PULMONIC VALVE AV Area (Vmax):    2.12 cm     PV Vmax:       0.89 m/s AV Area (Vmean):   2.33 cm     PV Peak grad:  3.2 mmHg AV Area (VTI):     2.68 cm AV Vmax:           110.00 cm/s AV Vmean:          69.700 cm/s AV VTI:            0.149 m AV Peak Grad:      4.8 mmHg AV Mean Grad:      2.0 mmHg LVOT Vmax:         74.30 cm/s LVOT Vmean:        51.800 cm/s LVOT  VTI:          0.127 m LVOT/AV VTI ratio: 0.85  AORTA Ao Root diam: 3.30 cm Ao Asc diam:  3.10 cm MITRAL VALVE MV Area (PHT): 3.03 cm    SHUNTS MV Decel Time: 250 msec    Systemic VTI:  0.13 m MV E velocity: 54.30 cm/s  Systemic Diam: 2.00 cm MV A velocity: 48.80 cm/s MV E/A ratio:  1.11 Arta Lark Electronically signed by Arta Lark Signature Date/Time: 04/04/2024/3:23:43 PM    Final  Scheduled Meds:  empagliflozin   10 mg Oral Daily   enoxaparin  (LOVENOX ) injection  40 mg Subcutaneous Q24H   furosemide   40 mg Intravenous BID   insulin  aspart  0-15 Units Subcutaneous TID WC   insulin  aspart  0-5 Units Subcutaneous QHS   losartan   25 mg Oral Daily   pantoprazole   40 mg Oral Daily   polyethylene glycol  17 g Oral Daily   senna  1 tablet Oral QHS   sodium chloride  flush  3 mL Intravenous Q12H   spironolactone   12.5 mg Oral Daily   Continuous Infusions:  magnesium  sulfate bolus IVPB       LOS: 2 days    Time spent:    Deforest Fast, MD [facility]   04/05/2024, 9:47 AM

## 2024-04-05 NOTE — Progress Notes (Signed)
 Pt had episode of 24 beats  NSVT at 0600H while standing at bedside using urinal, asymptomatic no active complaints. Notified Dr. Segars about the events, with order placed. Kept patient safe and comfortable.

## 2024-04-05 NOTE — Plan of Care (Signed)
  Problem: Education: Goal: Ability to describe self-care measures that may prevent or decrease complications (Diabetes Survival Skills Education) will improve Outcome: Progressing   Problem: Fluid Volume: Goal: Ability to maintain a balanced intake and output will improve Outcome: Progressing

## 2024-04-05 NOTE — Progress Notes (Signed)
 Heart Failure Nurse Navigator Progress Note  PCP: Scherrie Curt, MD PCP-Cardiologist: None Admission Diagnosis: None Admitted from: [facility]  Presentation:   Eric Frederick presented with shortness of breath since Sunday, hard to lay flat in bed, cough, went to Primary care and they recommended he go to the ED for a abnormal EKG. BP 189/103, HR 101, BNP 242.9, BMI 46.60. D-Dimer 1.70, Troponin 71. CXR from PCP showed no active cardiopulmonary disease, CTA chest showed no PE, mild multifocal RUL infiltrates, mild to moderate bibasilar atelectasis and/or infiltrates R > L, possible interstitial edema, small bilateral pleural effusions. Echocardiogram showed EF 50-55%, G1DD, normal RV function, mild AR, normal IVC.  4/25  patient noted to have 24-beat run of NSVT, was asymptomatic during this episode. Nursing staff reported that patient was standing at bedside using the urinal during the episode. was given magnesium  x 1. Cardiology consulted in  setting of NSVT with acute CHF.   Patient was educated on the sign and symptoms of heart failure, daily weights, when to call his doctor or go to the ED, Diet/ fluid restrictions, taking all medications as prescribed and attending all medical appointments. Patient verbalized his understanding of all education. A HF TOC appointment was scheduled for 04/12/2024 @ 11 am.     ECHO/ LVEF: 50-55 % New  Clinical Course:  Past Medical History:  Diagnosis Date   Diabetes mellitus    Diverticulosis    GERD (gastroesophageal reflux disease)    Hiatal hernia 04/22/2015   Hyperlipidemia    Hypertension    Serrated adenoma of colon 06/2016     Social History   Socioeconomic History   Marital status: Married    Spouse name: Hettie Lota   Number of children: 1   Years of education: Not on file   Highest education level: Bachelor's degree (e.g., BA, AB, BS)  Occupational History   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[employer]  Tobacco Use    Smoking status: Former    Current packs/day: 0.00    Types: Cigarettes    Quit date: 03/02/1991    Years since quitting: 33.1   Smokeless tobacco: Never  Vaping Use   Vaping status: Never Used  Substance and Sexual Activity   Alcohol use: No    Alcohol/week: 0.0 standard drinks of alcohol   Drug use: No   Sexual activity: Not on file  Other Topics Concern   Not on file  Social History Narrative   Not on file   Social Drivers of Health   Financial Resource Strain: Low Risk  (05/20/2023)   Overall Financial Resource Strain (CARDIA)    Difficulty of Paying Living Expenses: Not hard at all  Food Insecurity: No Food Insecurity (04/04/2024)   Hunger Vital Sign    Worried About Running Out of Food in the Last Year: Never true    Ran Out of Food in the Last Year: Never true  Transportation Needs: No Transportation Needs (04/04/2024)   PRAPARE - Administrator, Civil Service (Medical): No    Lack of Transportation (Non-Medical): No  Physical Activity: Sufficiently Active (05/20/2023)   Exercise Vital Sign    Days of Exercise per Week: 5 days    Minutes of Exercise per Session: 150+ min  Stress: No Stress Concern Present (05/20/2023)   [employer] of Occupational Health - Occupational Stress Questionnaire    Feeling of Stress : Not at all  Social Connections: Unknown (05/20/2023)   Social Connection and Isolation Panel [  NHANES]    Frequency of Communication with Friends and Family: Patient declined    Frequency of Social Gatherings with Friends and Family: Patient declined    Attends Religious Services: More than 4 times per year    Active Member of [employer] or Organizations: Yes    Attends Banker Meetings: Patient declined    Marital Status: Married   Water engineer and Provision:  Detailed education and instructions provided on heart failure disease management including the following:  Signs and symptoms of Heart Failure When to call the  physician Importance of daily weights Low sodium diet Fluid restriction Medication management Anticipated future follow-up appointments  Patient education given on each of the above topics.  Patient acknowledges understanding via teach back method and acceptance of all instructions.  Education Materials:  "Living Better With Heart Failure" Booklet, HF zone tool, & Daily Weight Tracker Tool.  Patient has scale at home: yes Patient has pill box at home: Yes    High Risk Criteria for Readmission and/or Poor Patient Outcomes: Heart failure hospital admissions (last 6 months): 1  No Show rate: 0 Difficult social situation: No Demonstrates medication adherence: yes Primary Language: English Literacy level: reading, writing, and comprehension  Barriers of Care:   New HF diagnosis Diet/ fluid restrictions Daily weights  Considerations/Referrals:   Referral made to Heart Failure Pharmacist Stewardship: No Referral made to Heart Failure CSW/NCM TOC: NA Referral made to Heart & Vascular TOC clinic: Yes, 04/12/2024 @ 11 am.   Items for Follow-up on DC/TOC: Continued HF Education Diet/ fluid restrictions/ daily weights   Randie Bustle, BSN, RN Heart Failure Teacher, adult education Only

## 2024-04-05 NOTE — Plan of Care (Signed)
  Problem: Coping: Goal: Ability to adjust to condition or change in health will improve Outcome: Progressing   Problem: Fluid Volume: Goal: Ability to maintain a balanced intake and output will improve Outcome: Progressing   Problem: Nutritional: Goal: Maintenance of adequate nutrition will improve Outcome: Progressing   Problem: Skin Integrity: Goal: Risk for impaired skin integrity will decrease Outcome: Progressing   Problem: Tissue Perfusion: Goal: Adequacy of tissue perfusion will improve Outcome: Progressing   Problem: Clinical Measurements: Goal: Will remain free from infection Outcome: Progressing   Problem: Activity: Goal: Risk for activity intolerance will decrease Outcome: Progressing   Problem: Nutrition: Goal: Adequate nutrition will be maintained Outcome: Progressing   Problem: Safety: Goal: Ability to remain free from injury will improve Outcome: Progressing

## 2024-04-05 NOTE — Consult Note (Addendum)
 Cardiology Consultation   Patient ID: Eric Frederick MRN: [id number]; DOB: 1966-04-16  Admit date: 04/02/2024 Date of Consult: 04/05/2024  PCP:  Eric Curt, MD   [facility] Providers Cardiologist:  New, lives in [place] can set him up there  Patient Profile:   Eric Frederick is a 58 y.o. male with a hx of diabetes, HLD, HTN, tobacco use disorder, obesity who is being seen 04/05/2024 for the evaluation of acute CHF, NSVT at the request of Dr. Drexel Frederick.  History of Present Illness:   Eric Frederick has past medical history as stated above. He came to [facility] ED on 04/03/2024 with shortness of breath x 3 days that was worse with exertion. Reported associated orthopnea and DOE. He reported no know history of heart failure. He does not typically wear oxygen at home.He was seen by his PCP on 4/22 who started the patient on doxycyline and prednisone . They did a CXR at his PCP that was negative and was instructed to go to the ED for further evaluation.   Relevant workup in the ED/since admission includes: CBC, BMP normal, Mag normal, BNP elevated at 242, d-dimer elevated at 1.70, troponin 64 > 64, CXR from PCP showed no active cardiopulmonary disease, CTA chest showed no PE, mild multifocal RUL infiltrates, mild to moderate bibasilar atelectasis and/or infiltrates R > L, possible interstitial edema, small bilateral pleural effusions. Echocardiogram showed EF 50-55%, G1DD, normal RV function, mild AR, normal IVC.  Patient was admitted to medicine service. He was started on IV Lasix  40 mg BID for diuresis and was responding well. Was started on Jardiance  10 mg, losartan  25 mg and spironolactone  12.5 mg.   Morning of 4/25 patient was noted to have 24-beat run of NSVT. The patient was asymptomatic during this episode. Nursing staff reported that patient was standing at bedside using the urinal during the episode. He was given magnesium  x 1. Cardiology was consulted in the setting of NSVT  with acute CHF.   After speaking with patient and his son, they agree with the history as stated above. He said he first experienced shortness of breath, worse with exertion, starting Sunday 4/20. He had associated cough, especially when trying to lay down at night. He notes some LE edema, but claims he has some at his baseline so it was hard to see if there was any significant changes.   Past Medical History:  Diagnosis Date   Diabetes mellitus    Diverticulosis    GERD (gastroesophageal reflux disease)    Hiatal hernia 04/22/2015   Hyperlipidemia    Hypertension    Serrated adenoma of colon 06/2016   Past Surgical History:  Procedure Laterality Date   KNEE ARTHROPLASTY Right    2023   SHOULDER ARTHROSCOPY W/ ROTATOR CUFF REPAIR Right 12/12/2012   Eric Frederick   TOTAL KNEE ARTHROPLASTY Left    2022    Home Medications:  Prior to Admission medications   Medication Sig Start Date End Date Taking? Authorizing Provider  amoxicillin (AMOXIL) 500 MG capsule Take 1,000 mg by mouth 2 (two) times daily. 03/13/24  Yes [provider]  ascorbic acid (VITAMIN C) 1000 MG tablet Take 1,000 mg by mouth daily.   Yes [provider]  Cinnamon 500 MG TABS Take 1,000 mg by mouth daily.   Yes [provider]  doxycycline  (VIBRA -TABS) 100 MG tablet Take 1 tablet (100 mg total) by mouth 2 (two) times daily for 7 days. 04/02/24 04/09/24 Yes Eric Francis, NP  ibuprofen (ADVIL) 200 MG tablet Take 600 mg by mouth every 6 (six) hours as needed for headache or mild pain (pain score 1-3) (back pain).   Yes [provider]  lisinopril  (ZESTRIL ) 40 MG tablet Take 1 tablet (40 mg total) by mouth daily. Patient taking differently: Take 40 mg by mouth at bedtime. 11/22/23  Yes Frederick, Eric Nay, MD  metFORMIN  (GLUCOPHAGE ) 1000 MG tablet Take 1 tablet (1,000 mg total) by mouth 2 (two) times daily. 11/22/23  Yes Frederick, Eric Nay, MD  Multiple Vitamins-Minerals (MULTIVITAMIN MEN 50+)  TABS Take 1 tablet by mouth daily.   Yes [provider]  omeprazole  (PRILOSEC) 40 MG capsule Take 1 capsule (40 mg total) by mouth daily. 11/22/23  Yes Frederick, Eric Nay, MD  predniSONE  (DELTASONE ) 10 MG tablet Take 4 tablets ( total 40 mg) by mouth for 2 days; take 3 tablets ( total 30 mg) by mouth for 2 days; take 2 tablets ( total 20 mg) by mouth for 1 day; take 1 tablet ( total 10 mg) by mouth for 1 day. 04/02/24  Yes Frederick, Charanpreet, NP  sildenafil  (VIAGRA ) 100 MG tablet Take 0.5-1 tablets (50-100 mg total) by mouth daily as needed for erectile dysfunction. 11/22/23  Yes Frederick, Eric Nay, MD  tirzepatide [facility]) 10 MG/0.5ML Pen Inject 10 mg into the skin once a week. 11/22/23  Yes Frederick, Eric Nay, MD  albuterol  (VENTOLIN  HFA) 108 (90 Base) MCG/ACT inhaler Inhale 2 puffs into the lungs every 6 (six) hours as needed for wheezing or shortness of breath. 04/02/24   Eric Francis, NP   Inpatient Medications: Scheduled Meds:  carvedilol   6.25 mg Oral BID WC   empagliflozin   10 mg Oral Daily   enoxaparin  (LOVENOX ) injection  40 mg Subcutaneous Q24H   furosemide   40 mg Intravenous BID   insulin  aspart  0-15 Units Subcutaneous TID WC   insulin  aspart  0-5 Units Subcutaneous QHS   losartan   25 mg Oral Daily   pantoprazole   40 mg Oral Daily   polyethylene glycol  17 g Oral Daily   senna  1 tablet Oral QHS   sodium chloride  flush  3 mL Intravenous Q12H   spironolactone   12.5 mg Oral Daily   Continuous Infusions:  magnesium  sulfate bolus IVPB 2 g (04/05/24 1051)   PRN Meds: acetaminophen  **OR** [DISCONTINUED] acetaminophen , ipratropium-albuterol , ondansetron  **OR** ondansetron  (ZOFRAN ) IV  Allergies:    Allergies  Allergen Reactions   Rosuvastatin  Hives   Glipizide  Hives, Itching and Rash   Social History:   Social History   Socioeconomic History   Marital status: Married    Spouse name: Eric Frederick   Number of children: 1   Years of education: Not on file   Highest  education level: Bachelor's degree (e.g., BA, AB, BS)  Occupational History   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[employer]  Tobacco Use   Smoking status: Former    Current packs/day: 0.00    Types: Cigarettes    Quit date: 03/02/1991    Years since quitting: 33.1   Smokeless tobacco: Never  Vaping Use   Vaping status: Never Used  Substance and Sexual Activity   Alcohol use: No    Alcohol/week: 0.0 standard drinks of alcohol   Drug use: No   Sexual activity: Not on file  Other Topics Concern   Not on file  Social History Narrative   Not on file   Social Drivers of Health   Financial Resource Strain: Low Risk  (05/20/2023)   Overall Financial  Resource Strain (CARDIA)    Difficulty of Paying Living Expenses: Not hard at all  Food Insecurity: No Food Insecurity (04/04/2024)   Hunger Vital Sign    Worried About Running Out of Food in the Last Year: Never true    Ran Out of Food in the Last Year: Never true  Transportation Needs: No Transportation Needs (04/04/2024)   PRAPARE - Administrator, Civil Service (Medical): No    Lack of Transportation (Non-Medical): No  Physical Activity: Sufficiently Active (05/20/2023)   Exercise Vital Sign    Days of Exercise per Week: 5 days    Minutes of Exercise per Session: 150+ min  Stress: No Stress Concern Present (05/20/2023)   [employer] of Occupational Health - Occupational Stress Questionnaire    Feeling of Stress : Not at all  Social Connections: Unknown (05/20/2023)   Social Connection and Isolation Panel [NHANES]    Frequency of Communication with Friends and Family: Patient declined    Frequency of Social Gatherings with Friends and Family: Patient declined    Attends Religious Services: More than 4 times per year    Active Member of [employer] or Organizations: Yes    Attends Banker Meetings: Patient declined    Marital Status: Married  Catering manager Violence: Not At Risk (04/04/2024)   Humiliation,  Afraid, Rape, and Kick questionnaire    Fear of Current or Ex-Partner: No    Emotionally Abused: No    Physically Abused: No    Sexually Abused: No    Family History:   Family History  Problem Relation Age of Onset   Other Father        complications from agent orange   Colon cancer Neg Hx    Rectal cancer Neg Hx    Stomach cancer Neg Hx    Esophageal cancer Neg Hx      ROS:  Please see the history of present illness.  All other ROS reviewed and negative.     Physical Exam/Data:   Vitals:   04/04/24 1942 04/05/24 0047 04/05/24 0411 04/05/24 0740  BP: 139/83 (!) 147/86 139/71 (!) 177/94  Pulse: 91 87 83 85  Resp: 18 17 18 18   Temp: 98.6 F (37 C) 98 F (36.7 C) 98.1 F (36.7 C) 98 F (36.7 C)  TempSrc: Oral Oral Oral Oral  SpO2: 93% 94% 96% 94%  Weight:   118.2 kg   Height:       Intake/Output Summary (Last 24 hours) at 04/05/2024 1054 Last data filed at 04/05/2024 0900 Gross per 24 hour  Intake 700 ml  Output 6175 ml  Net -5475 ml      04/05/2024    4:11 AM 04/04/2024   11:47 AM 04/02/2024    7:04 PM  Last 3 Weights  Weight (lbs) 260 lb 9.3 oz 262 lb 5.6 oz 273 lb 9.6 oz  Weight (kg) 118.2 kg 119 kg 124.104 kg     Body mass index is 43.95 kg/m.   General:  Well nourished, well developed, in no acute distress, on room air HEENT: normal Neck: unable to assess JVD due to body habitus Vascular: No carotid bruits; Distal pulses 2+ bilaterally Cardiac:  normal S1, S2; RRR; no murmur  Lungs:  distant lung sounds, but clear   Abd: soft, nontender, Ext: 1+ pitting LE bilateral edema Musculoskeletal:  No deformities, BUE and BLE strength normal and equal Skin: warm and dry  Neuro:  , no focal abnormalities noted Psych:  Normal affect   EKG:  The EKG was personally reviewed and demonstrates:  sinus tachycardia, HR 107, PVC  Telemetry:  Telemetry was personally reviewed and demonstrates:  sinus rhythm, HR 80-90s, 24-beat run of NSVT noted 6am 4/25, patient was  asymptomatic   Relevant CV Studies: Echocardiogram, 04/04/2024 IMPRESSIONS  Left ventricular ejection fraction, by estimation, is 50 to 55% . The left ventricle has low normal function. The left ventricle has no regional wall motion abnormalities. Left ventricular diastolic parameters are consistent with Grade I diastolic dysfunction ( impaired relaxation) Right ventricular systolic function is normal. The right ventricular size is normal. The mitral valve is normal in structure. No evidence of mitral valve regurgitation. No evidence of mitral stenosis. The aortic valve is normal in structure. Aortic valve regurgitation is mild. No aortic stenosis is present. The inferior vena cava is normal in size with greater than 50% respiratory variability, suggesting right atrial pressure of 3 mmHg.  Laboratory Data:  High Sensitivity Troponin:   Recent Labs  Lab 04/02/24 1913 04/02/24 2208 04/03/24 0535 04/03/24 0808  TROPONINIHS 71* 83* 64* 64*     Chemistry Recent Labs  Lab 04/03/24 0808 04/04/24 0438 04/05/24 0323  NA 141 141 142  K 3.9 4.2 4.0  CL 103 107 103  CO2 26 23 28   GLUCOSE 116* 135* 134*  BUN 8 17 20   CREATININE 0.79 0.88 1.00  CALCIUM  9.1 8.6* 8.8*  MG  --  1.8 2.2  GFRNONAA >60 >60 >60  ANIONGAP 12 11 11     Recent Labs  Lab 04/02/24 1913  PROT 6.3*  ALBUMIN 3.8  AST 24  ALT 39  ALKPHOS 79  BILITOT 1.0   Lipids No results for input(s): "CHOL", "TRIG", "HDL", "LABVLDL", "LDLCALC", "CHOLHDL" in the last 168 hours.  Hematology Recent Labs  Lab 04/02/24 1913 04/04/24 0438  WBC 8.0 6.6  RBC 4.62 4.97  HGB 13.8 14.6  HCT 41.8 45.5  MCV 90.5 91.5  MCH 29.9 29.4  MCHC 33.0 32.1  RDW 13.0 12.6  PLT 263 296   Thyroid No results for input(s): "TSH", "FREET4" in the last 168 hours.  BNP Recent Labs  Lab 04/02/24 1913  BNP 242.9*    DDimer  Recent Labs  Lab 04/02/24 1913  DDIMER 1.70*   Radiology/Studies:  ECHOCARDIOGRAM COMPLETE Result Date:  04/04/2024    ECHOCARDIOGRAM REPORT   Patient Name:   SEBASTIAN LURZ Date of Exam: 04/04/2024 Medical Rec #:  [id number]      Height:       64.6 in Accession #:    [id number]     Weight:       262.3 lb Date of Birth:  Nov 21, 1966      BSA:          2.209 m Patient Age:    57 years       BP:           163/79 mmHg Patient Gender: M              HR:           89 bpm. Exam Location:  Inpatient Procedure: 2D Echo, Cardiac Doppler, Color Doppler and Intracardiac            Opacification Agent (Both Spectral and Color Flow Doppler were            utilized during procedure). Indications:    CHF-acute diastolic  History:        Patient has no prior  history of Echocardiogram examinations.                 Signs/Symptoms:Shortness of Breath; Risk Factors:Diabetes and                 Hypertension.  Sonographer:    Juanita Shaw Referring Phys: [id number] ALLISON L ELLIS IMPRESSIONS  1. Left ventricular ejection fraction, by estimation, is 50 to 55%. The left ventricle has low normal function. The left ventricle has no regional wall motion abnormalities. Left ventricular diastolic parameters are consistent with Grade I diastolic dysfunction (impaired relaxation).  2. Right ventricular systolic function is normal. The right ventricular size is normal.  3. The mitral valve is normal in structure. No evidence of mitral valve regurgitation. No evidence of mitral stenosis.  4. The aortic valve is normal in structure. Aortic valve regurgitation is mild. No aortic stenosis is present.  5. The inferior vena cava is normal in size with greater than 50% respiratory variability, suggesting right atrial pressure of 3 mmHg. FINDINGS  Left Ventricle: Left ventricular ejection fraction, by estimation, is 50 to 55%. The left ventricle has low normal function. The left ventricle has no regional wall motion abnormalities. The left ventricular internal cavity size was normal in size. There is no left ventricular hypertrophy. Left ventricular diastolic  parameters are consistent with Grade I diastolic dysfunction (impaired relaxation). Right Ventricle: The right ventricular size is normal. No increase in right ventricular wall thickness. Right ventricular systolic function is normal. Left Atrium: Left atrial size was normal in size. Right Atrium: Right atrial size was normal in size. Pericardium: There is no evidence of pericardial effusion. Mitral Valve: The mitral valve is normal in structure. No evidence of mitral valve regurgitation. No evidence of mitral valve stenosis. Tricuspid Valve: The tricuspid valve is normal in structure. Tricuspid valve regurgitation is not demonstrated. No evidence of tricuspid stenosis. Aortic Valve: The aortic valve is normal in structure. Aortic valve regurgitation is mild. No aortic stenosis is present. Aortic valve mean gradient measures 2.0 mmHg. Aortic valve peak gradient measures 4.8 mmHg. Aortic valve area, by VTI measures 2.68 cm. Pulmonic Valve: The pulmonic valve was normal in structure. Pulmonic valve regurgitation is not visualized. No evidence of pulmonic stenosis. Aorta: The aortic root is normal in size and structure. Venous: The inferior vena cava is normal in size with greater than 50% respiratory variability, suggesting right atrial pressure of 3 mmHg. IAS/Shunts: No atrial level shunt detected by color flow Doppler.  LEFT VENTRICLE PLAX 2D LVIDd:         4.40 cm      Diastology LVIDs:         3.10 cm      LV e' medial:    7.07 cm/s LV PW:         1.40 cm      LV E/e' medial:  7.7 LV IVS:        1.20 cm      LV e' lateral:   7.83 cm/s LVOT diam:     2.00 cm      LV E/e' lateral: 6.9 LV SV:         40 LV SV Index:   18 LVOT Area:     3.14 cm  LV Volumes (MOD) LV vol d, MOD A4C: 200.0 ml LV vol s, MOD A4C: 67.2 ml LV SV MOD A4C:     200.0 ml RIGHT VENTRICLE            IVC  RV Basal diam:  3.80 cm    IVC diam: 1.00 cm RV S prime:     9.14 cm/s LEFT ATRIUM             Index        RIGHT ATRIUM          Index LA diam:         2.90 cm 1.31 cm/m   RA Area:     8.55 cm LA Vol (A2C):   31.6 ml 14.30 ml/m  RA Volume:   13.60 ml 6.16 ml/m LA Vol (A4C):   47.6 ml 21.55 ml/m LA Biplane Vol: 39.4 ml 17.83 ml/m  AORTIC VALVE                    PULMONIC VALVE AV Area (Vmax):    2.12 cm     PV Vmax:       0.89 m/s AV Area (Vmean):   2.33 cm     PV Peak grad:  3.2 mmHg AV Area (VTI):     2.68 cm AV Vmax:           110.00 cm/s AV Vmean:          69.700 cm/s AV VTI:            0.149 m AV Peak Grad:      4.8 mmHg AV Mean Grad:      2.0 mmHg LVOT Vmax:         74.30 cm/s LVOT Vmean:        51.800 cm/s LVOT VTI:          0.127 m LVOT/AV VTI ratio: 0.85  AORTA Ao Root diam: 3.30 cm Ao Asc diam:  3.10 cm MITRAL VALVE MV Area (PHT): 3.03 cm    SHUNTS MV Decel Time: 250 msec    Systemic VTI:  0.13 m MV E velocity: 54.30 cm/s  Systemic Diam: 2.00 cm MV A velocity: 48.80 cm/s MV E/A ratio:  1.11 Arta Lark Electronically signed by Arta Lark Signature Date/Time: 04/04/2024/3:23:43 PM    Final    CT Angio Chest PE W and/or Wo Contrast Result Date: 04/03/2024 CLINICAL DATA:  Shortness of breath for 3 days. EXAM: CT ANGIOGRAPHY CHEST WITH CONTRAST TECHNIQUE: Multidetector CT imaging of the chest was performed using the standard protocol during bolus administration of intravenous contrast. Multiplanar CT image reconstructions and MIPs were obtained to evaluate the vascular anatomy. RADIATION DOSE REDUCTION: This exam was performed according to the departmental dose-optimization program which includes automated exposure control, adjustment of the mA and/or kV according to patient size and/or use of iterative reconstruction technique. CONTRAST:  75mL OMNIPAQUE  IOHEXOL  350 MG/ML SOLN COMPARISON:  None Available. FINDINGS: Cardiovascular: There is mild calcification of the aortic arch, without evidence of aortic aneurysm. Satisfactory opacification of the pulmonary arteries to the segmental level. No evidence of pulmonary embolism. Normal  heart size. No pericardial effusion. Mediastinum/Nodes: No enlarged mediastinal, hilar, or axillary lymph nodes. Thyroid gland, trachea, and esophagus demonstrate no significant findings. Lungs/Pleura: There is mild diffuse interstitial thickening. Small multifocal right upper lobe infiltrates are seen. Moderate severity posterior right basilar and mild posterior left basilar atelectasis and/or infiltrate is also seen. There are small bilateral pleural effusions. No pneumothorax is identified. Upper Abdomen: There is a small hiatal hernia. Musculoskeletal: No chest wall abnormality. No acute or significant osseous findings. Review of the MIP images confirms the above findings. IMPRESSION: 1. No evidence of pulmonary embolism. 2. Mild  multifocal right upper lobe infiltrates. 3. Mild to moderate severity bibasilar atelectasis and/or infiltrate, right greater than left. 4. Interstitial thickening which may represent sequelae associated with interstitial edema. 5. Small bilateral pleural effusions. Electronically Signed   By: Virgle Grime M.D.   On: 04/03/2024 03:59   DG Chest 2 View Result Date: 04/02/2024 CLINICAL DATA:  Shortness of breath, cough. EXAM: CHEST - 2 VIEW COMPARISON:  October 29, 2012. FINDINGS: The heart size and mediastinal contours are within normal limits. Both lungs are clear. The visualized skeletal structures are unremarkable. IMPRESSION: No active cardiopulmonary disease. Electronically Signed   By: Rosalene Colon M.D.   On: 04/02/2024 16:37   Assessment and Plan:   Newly diagnosed HFpEF Echo this admission showed: EF 50-55%, G1DD, normal RV function, mild AR, normal IVC BNP elevated at 242 Presented to ED, per PCP recommendation, for shortness of breath and orthopnea  No prior known history of CHF Creatinine slightly above baseline  Net nearly -9 L this admission, with -6 L output just yesterday  Weight 260 lb today, down 2 lb from day prior  Most recent BP 177/94, starting  on new meds as below today  Currently on room air   Patient reports feeling much better since diuresing  Currently on IV Lasix  40 mg BID Currently on Jardiance  10 mg daily Started on Losartan  25 mg daily today Started on spironolactone  12.5 mg daily today Start CoReg  6.25 mg BID  Continue to monitor daily weights, BMPs, and strict I&O's  Episode of NSVT Patient had 24-beat episode of NSVT early morning 4/25 while using urinal Asymptomatic during this episode Given magnesium  2 g x 1 dose  Potassium 4.0, Magnesium  2.2  HR 80-90s Starting CoReg  6.25 mg BID   Per primary Diabetes HTN Obesity   Risk Assessment/Risk Scores:   [place]  Heart Association (NYHA) Functional Class NYHA Class II For questions or updates, please contact [facility] Please consult [email or web address] for contact info under  Signed, Jiles Mote, PA-C  04/05/2024 10:54 AM  Patient seen and examined, note reviewed with the signed Advanced Practice Provider. I personally reviewed laboratory data, imaging studies and relevant notes. I independently examined the patient and formulated the important aspects of the plan. I have personally discussed the plan with the patient and/or family. Comments or changes to the note/plan are indicated below.   Patient seen and examined at his bedside. His wife and son was at the bedside. He is aware of his new diagnosis of heart failure with preserved ejection fraction.   Newly diagnosed heart failure with preserved ejection fraction  NSVT Diabetes Mellitus  Hypertension  Obesity  Clinically he still appears to be volume overloaded.  I agree with continuing his Lasix  40 mg twice daily for at least 1 more day and then reassess the need to transition to p.o. He has been started on guideline directed medications: Continue his losartan  25 mg daily, spironolactone  12.5 mg a day, Jardiance  10 mg daily.  In the setting of his NSVT he will benefit from a low-dose  beta-blocker Coreg  6.25 mg twice daily has been added to his regiment.  Will continue to monitor. Please keep mag above 2 and K above 4. I was able to speak with the patient and his family and answered all of their questions.  Will continue to follow with you  Fidelis Loth DO, MS [facility] Attending Cardiologist [facility]  [address] [place], [place] [address] [phone]  Website: [email or web address]

## 2024-04-06 ENCOUNTER — Other Ambulatory Visit (HOSPITAL_COMMUNITY): Payer: Self-pay

## 2024-04-06 DIAGNOSIS — I5031 Acute diastolic (congestive) heart failure: Secondary | ICD-10-CM

## 2024-04-06 LAB — BASIC METABOLIC PANEL WITH GFR
Anion gap: 11 (ref 5–15)
BUN: 25 mg/dL — ABNORMAL HIGH (ref 6–20)
CO2: 27 mmol/L (ref 22–32)
Calcium: 9 mg/dL (ref 8.9–10.3)
Chloride: 102 mmol/L (ref 98–111)
Creatinine, Ser: 0.88 mg/dL (ref 0.61–1.24)
GFR, Estimated: >60 mL/min (ref >60)
Glucose, Bld: 116 mg/dL — ABNORMAL HIGH (ref 70–99)
Potassium: 4.1 mmol/L (ref 3.5–5.1)
Sodium: 140 mmol/L (ref 135–145)

## 2024-04-06 LAB — MAGNESIUM: Magnesium: 2.5 mg/dL — ABNORMAL HIGH (ref 1.7–2.4)

## 2024-04-06 LAB — GLUCOSE, CAPILLARY: Glucose-Capillary: 113 mg/dL — ABNORMAL HIGH (ref 70–99)

## 2024-04-06 MED ORDER — EMPAGLIFLOZIN 10 MG PO TABS
10.0000 mg | ORAL_TABLET | Freq: Every day | ORAL | 1 refills | Status: DC
  Filled 2024-04-06 – 2024-05-03 (×2): qty 30, 30d supply, fill #0

## 2024-04-06 MED ORDER — FUROSEMIDE 40 MG PO TABS
40.0000 mg | ORAL_TABLET | Freq: Every day | ORAL | 1 refills | Status: DC
Start: 2024-04-06 — End: 2024-05-17
  Filled 2024-04-06 – 2024-05-03 (×2): qty 40, 30d supply, fill #0

## 2024-04-06 MED ORDER — LOSARTAN POTASSIUM 25 MG PO TABS
25.0000 mg | ORAL_TABLET | Freq: Every day | ORAL | 1 refills | Status: DC
  Filled 2024-04-06 – 2024-05-03 (×2): qty 30, 30d supply, fill #0

## 2024-04-06 MED ORDER — CARVEDILOL 6.25 MG PO TABS
6.2500 mg | ORAL_TABLET | Freq: Two times a day (BID) | ORAL | 1 refills | Status: DC
  Filled 2024-04-06: qty 60, 30d supply, fill #0

## 2024-04-06 MED ORDER — SPIRONOLACTONE 25 MG PO TABS
12.5000 mg | ORAL_TABLET | Freq: Every day | ORAL | 1 refills | Status: DC
  Filled 2024-04-06: qty 14, 28d supply, fill #0

## 2024-04-06 NOTE — Discharge Summary (Signed)
 Physician Discharge Summary  Eric Frederick ZOX:[id number] DOB: 09-28-1966 DOA: 04/02/2024  PCP: Scherrie Curt, MD  Admit date: 04/02/2024 Discharge date: 04/06/2024  Time spent: 45 minutes  Recommendations for Outpatient Follow-up:  CHF TOC clinic on 5/2, please check BMP at follow-up Needs further discussions regarding possible OSA, does not seem open to this suggestion now   Discharge Diagnoses:  Acute diastolic CHF, new diagnosis Ventricular tachycardia Type 2 diabetes mellitus Hypertension Obesity  Discharge Condition: Improved  Diet recommendation: Low-sodium, diabetic  Filed Weights   04/04/24 1147 04/05/24 0411 04/06/24 0430  Weight: 119 kg 118.2 kg 116.2 kg    History of present illness:  57/M with history of type 2 diabetes mellitus, dyslipidemia, hypertension, obesity presented to the ED with dyspnea on exertion for 3 to 4 days, associated with dry cough, wheezing, scant edema.  In the ED> VSS, BNP 242, troponin 71, 83, chest x-ray was unremarkable, D-dimer elevated, CTA chest was negative for PE concern for multifocal upper lobe infiltrates with mild bibasilar atelectasis or infiltrates and small bilateral pleural effusions as well as interstitial thickening which could be associated with edema - Admitted, started on diuretics, with EF 50-55%, grade 1 DD, normal RV - 4/25 AM with 24 beat run of V. tach  Hospital Course:   Acute diastolic CHF, new diagnosis - 2D echo noted EF 50-55%, no wall motion abnormality, grade 1 DD, normal RV -Improved with diuresis, down 17 LB -Switch to oral Lasix  40 Mg daily and additional as needed for weight gain -Also started on losartan , Jardiance  and Aldactone  - Counseled regarding diet, weight loss and lifestyle modification -Follow-up made in [facility] clinic 5/2   Ventricular tachycardia - Yesterday morning had a 24 beat run of V. tach was asymptomatic, magnesium  marginally low the previous day was repleted, repeat has been  normal  - Seen by Cardiology in consultation as a result, started on low-dose Coreg  -Follow-up with cards   Diabetes mellitus 2 Metformin  held on admission, resume at DC On Mounjaro , doses every Friday - hemoglobin A1c: 4.9   Hypertension - Meds as above   Obesity BMI is 43.9,  -recommended diet lifestyle modification On Mounjaro at baseline  Suspected OSA - Patient reports having a sleep study few years ago and being prescribed CPAP only for a week, does not seem to think he has sleep apnea, reluctant to repeat sleep study at this time, - Recommended weight loss and further discussions regarding this  Discharge Exam: Vitals:   04/06/24 0430 04/06/24 0725  BP: (!) 148/76 127/81  Pulse: 62 82  Resp: 19 20  Temp: 98.2 F (36.8 C) 98.1 F (36.7 C)  SpO2: 91% 92%   General exam: Obese male sitting up in bed, AAO x 3 HEENT: Neck obese unable to assess JVD Respiratory system: Decreased breath sounds at the bases otherwise clear Cardiovascular system: S1 & S2 heard, RRR.  Abd: nondistended, soft and nontender.Normal bowel sounds heard. Extremities: Trace edema Skin: No rashes Psychiatry:  Mood & affect appropriate  Discharge Instructions    Allergies as of 04/06/2024       Reactions   Rosuvastatin  Hives   Glipizide  Hives, Itching, Rash        Medication List     STOP taking these medications    amoxicillin 500 MG capsule Commonly known as: AMOXIL   doxycycline  100 MG tablet Commonly known as: VIBRA -TABS   ibuprofen 200 MG tablet Commonly known as: ADVIL   lisinopril  40 MG tablet Commonly  known as: ZESTRIL    predniSONE  10 MG tablet Commonly known as: DELTASONE        TAKE these medications    albuterol  108 (90 Base) MCG/ACT inhaler Commonly known as: VENTOLIN  HFA Inhale 2 puffs into the lungs every 6 (six) hours as needed for wheezing or shortness of breath.   ascorbic acid 1000 MG tablet Commonly known as: VITAMIN C Take 1,000 mg by mouth  daily.   carvedilol  6.25 MG tablet Commonly known as: COREG  Take 1 tablet (6.25 mg total) by mouth 2 (two) times daily with a meal.   Cinnamon 500 MG Tabs Take 1,000 mg by mouth daily.   furosemide  40 MG tablet Commonly known as: Lasix  Take 1 tablet (40 mg total) by mouth daily. TAke an extra dose for weight gain of 3lb in 1 day or 5lb in 1 week   Jardiance  10 MG Tabs tablet Generic drug: empagliflozin  Take 1 tablet (10 mg total) by mouth daily. Start taking on: April 07, 2024   losartan  25 MG tablet Commonly known as: COZAAR  Take 1 tablet (25 mg total) by mouth daily. Start taking on: April 07, 2024   metFORMIN  1000 MG tablet Commonly known as: GLUCOPHAGE  Take 1 tablet (1,000 mg total) by mouth 2 (two) times daily.   Multivitamin Men 50+ Tabs Take 1 tablet by mouth daily.   omeprazole  40 MG capsule Commonly known as: PRILOSEC Take 1 capsule (40 mg total) by mouth daily.   sildenafil  100 MG tablet Commonly known as: Viagra  Take 0.5-1 tablets (50-100 mg total) by mouth daily as needed for erectile dysfunction.   spironolactone  25 MG tablet Commonly known as: ALDACTONE  Take 0.5 tablets (12.5 mg total) by mouth daily. Start taking on: April 07, 2024   tirzepatide 10 MG/0.5ML Pen Commonly known as: MOUNJARO Inject 10 mg into the skin once a week.       Allergies  Allergen Reactions   Rosuvastatin  Hives   Glipizide  Hives, Itching and Rash    Follow-up Information     [facility]. Go in 6 day(s).   Specialty: Cardiology Why: HospitAL FOLLOW UP 04/12/2024 @ 11 AM PLEASE bring a current medication list to appointment FREE valet parking, Entrance C, off [employer] information: [address] [place]  [phone]                 The results of significant diagnostics from this hospitalization (including imaging, microbiology, ancillary and laboratory) are listed  below for reference.    Significant Diagnostic Studies: ECHOCARDIOGRAM COMPLETE Result Date: 04/04/2024    ECHOCARDIOGRAM REPORT   Patient Name:   Eric Frederick Date of Exam: 04/04/2024 Medical Rec #:  [id number]      Height:       64.6 in Accession #:    [id number]     Weight:       262.3 lb Date of Birth:  10-30-1966      BSA:          2.209 m Patient Age:    57 years       BP:           163/79 mmHg Patient Gender: M              HR:           89 bpm. Exam Location:  Inpatient Procedure: 2D Echo, Cardiac Doppler, Color Doppler and Intracardiac  Opacification Agent (Both Spectral and Color Flow Doppler were            utilized during procedure). Indications:    CHF-acute diastolic  History:        Patient has no prior history of Echocardiogram examinations.                 Signs/Symptoms:Shortness of Breath; Risk Factors:Diabetes and                 Hypertension.  Sonographer:    Juanita Shaw Referring Phys: [id number] ALLISON L ELLIS IMPRESSIONS  1. Left ventricular ejection fraction, by estimation, is 50 to 55%. The left ventricle has low normal function. The left ventricle has no regional wall motion abnormalities. Left ventricular diastolic parameters are consistent with Grade I diastolic dysfunction (impaired relaxation).  2. Right ventricular systolic function is normal. The right ventricular size is normal.  3. The mitral valve is normal in structure. No evidence of mitral valve regurgitation. No evidence of mitral stenosis.  4. The aortic valve is normal in structure. Aortic valve regurgitation is mild. No aortic stenosis is present.  5. The inferior vena cava is normal in size with greater than 50% respiratory variability, suggesting right atrial pressure of 3 mmHg. FINDINGS  Left Ventricle: Left ventricular ejection fraction, by estimation, is 50 to 55%. The left ventricle has low normal function. The left ventricle has no regional wall motion abnormalities. The left ventricular internal cavity  size was normal in size. There is no left ventricular hypertrophy. Left ventricular diastolic parameters are consistent with Grade I diastolic dysfunction (impaired relaxation). Right Ventricle: The right ventricular size is normal. No increase in right ventricular wall thickness. Right ventricular systolic function is normal. Left Atrium: Left atrial size was normal in size. Right Atrium: Right atrial size was normal in size. Pericardium: There is no evidence of pericardial effusion. Mitral Valve: The mitral valve is normal in structure. No evidence of mitral valve regurgitation. No evidence of mitral valve stenosis. Tricuspid Valve: The tricuspid valve is normal in structure. Tricuspid valve regurgitation is not demonstrated. No evidence of tricuspid stenosis. Aortic Valve: The aortic valve is normal in structure. Aortic valve regurgitation is mild. No aortic stenosis is present. Aortic valve mean gradient measures 2.0 mmHg. Aortic valve peak gradient measures 4.8 mmHg. Aortic valve area, by VTI measures 2.68 cm. Pulmonic Valve: The pulmonic valve was normal in structure. Pulmonic valve regurgitation is not visualized. No evidence of pulmonic stenosis. Aorta: The aortic root is normal in size and structure. Venous: The inferior vena cava is normal in size with greater than 50% respiratory variability, suggesting right atrial pressure of 3 mmHg. IAS/Shunts: No atrial level shunt detected by color flow Doppler.  LEFT VENTRICLE PLAX 2D LVIDd:         4.40 cm      Diastology LVIDs:         3.10 cm      LV e' medial:    7.07 cm/s LV PW:         1.40 cm      LV E/e' medial:  7.7 LV IVS:        1.20 cm      LV e' lateral:   7.83 cm/s LVOT diam:     2.00 cm      LV E/e' lateral: 6.9 LV SV:         40 LV SV Index:   18 LVOT Area:     3.14 cm  LV Volumes (MOD) LV vol d, MOD A4C: 200.0 ml LV vol s, MOD A4C: 67.2 ml LV SV MOD A4C:     200.0 ml RIGHT VENTRICLE            IVC RV Basal diam:  3.80 cm    IVC diam: 1.00 cm RV S  prime:     9.14 cm/s LEFT ATRIUM             Index        RIGHT ATRIUM          Index LA diam:        2.90 cm 1.31 cm/m   RA Area:     8.55 cm LA Vol (A2C):   31.6 ml 14.30 ml/m  RA Volume:   13.60 ml 6.16 ml/m LA Vol (A4C):   47.6 ml 21.55 ml/m LA Biplane Vol: 39.4 ml 17.83 ml/m  AORTIC VALVE                    PULMONIC VALVE AV Area (Vmax):    2.12 cm     PV Vmax:       0.89 m/s AV Area (Vmean):   2.33 cm     PV Peak grad:  3.2 mmHg AV Area (VTI):     2.68 cm AV Vmax:           110.00 cm/s AV Vmean:          69.700 cm/s AV VTI:            0.149 m AV Peak Grad:      4.8 mmHg AV Mean Grad:      2.0 mmHg LVOT Vmax:         74.30 cm/s LVOT Vmean:        51.800 cm/s LVOT VTI:          0.127 m LVOT/AV VTI ratio: 0.85  AORTA Ao Root diam: 3.30 cm Ao Asc diam:  3.10 cm MITRAL VALVE MV Area (PHT): 3.03 cm    SHUNTS MV Decel Time: 250 msec    Systemic VTI:  0.13 m MV E velocity: 54.30 cm/s  Systemic Diam: 2.00 cm MV A velocity: 48.80 cm/s MV E/A ratio:  1.11 Arta Lark Electronically signed by Arta Lark Signature Date/Time: 04/04/2024/3:23:43 PM    Final    CT Angio Chest PE W and/or Wo Contrast Result Date: 04/03/2024 CLINICAL DATA:  Shortness of breath for 3 days. EXAM: CT ANGIOGRAPHY CHEST WITH CONTRAST TECHNIQUE: Multidetector CT imaging of the chest was performed using the standard protocol during bolus administration of intravenous contrast. Multiplanar CT image reconstructions and MIPs were obtained to evaluate the vascular anatomy. RADIATION DOSE REDUCTION: This exam was performed according to the departmental dose-optimization program which includes automated exposure control, adjustment of the mA and/or kV according to patient size and/or use of iterative reconstruction technique. CONTRAST:  75mL OMNIPAQUE  IOHEXOL  350 MG/ML SOLN COMPARISON:  None Available. FINDINGS: Cardiovascular: There is mild calcification of the aortic arch, without evidence of aortic aneurysm. Satisfactory  opacification of the pulmonary arteries to the segmental level. No evidence of pulmonary embolism. Normal heart size. No pericardial effusion. Mediastinum/Nodes: No enlarged mediastinal, hilar, or axillary lymph nodes. Thyroid gland, trachea, and esophagus demonstrate no significant findings. Lungs/Pleura: There is mild diffuse interstitial thickening. Small multifocal right upper lobe infiltrates are seen. Moderate severity posterior right basilar and mild posterior left basilar atelectasis and/or infiltrate is also seen. There are small bilateral pleural effusions.  No pneumothorax is identified. Upper Abdomen: There is a small hiatal hernia. Musculoskeletal: No chest wall abnormality. No acute or significant osseous findings. Review of the MIP images confirms the above findings. IMPRESSION: 1. No evidence of pulmonary embolism. 2. Mild multifocal right upper lobe infiltrates. 3. Mild to moderate severity bibasilar atelectasis and/or infiltrate, right greater than left. 4. Interstitial thickening which may represent sequelae associated with interstitial edema. 5. Small bilateral pleural effusions. Electronically Signed   By: Virgle Grime M.D.   On: 04/03/2024 03:59   DG Chest 2 View Result Date: 04/02/2024 CLINICAL DATA:  Shortness of breath, cough. EXAM: CHEST - 2 VIEW COMPARISON:  October 29, 2012. FINDINGS: The heart size and mediastinal contours are within normal limits. Both lungs are clear. The visualized skeletal structures are unremarkable. IMPRESSION: No active cardiopulmonary disease. Electronically Signed   By: Rosalene Colon M.D.   On: 04/02/2024 16:37    Microbiology: Recent Results (from the past 240 hours)  Resp panel by RT-PCR (RSV, Flu A&B, Covid) Anterior Nasal Swab     Status: None   Collection Time: 04/02/24  7:16 PM   Specimen: Anterior Nasal Swab  Result Value Ref Range Status   SARS Coronavirus 2 by RT PCR NEGATIVE NEGATIVE Final   Influenza A by PCR NEGATIVE NEGATIVE  Final   Influenza B by PCR NEGATIVE NEGATIVE Final    Comment: (NOTE) The Xpert Xpress SARS-CoV-2/FLU/RSV plus assay is intended as an aid in the diagnosis of influenza from Nasopharyngeal swab specimens and should not be used as a sole basis for treatment. Nasal washings and aspirates are unacceptable for Xpert Xpress SARS-CoV-2/FLU/RSV testing.  Fact Sheet for Patients: BloggerCourse.com  Fact Sheet for Healthcare Providers: SeriousBroker.it  This test is not yet approved or cleared by the [place]  FDA and has been authorized for detection and/or diagnosis of SARS-CoV-2 by FDA under an Emergency Use Authorization (EUA). This EUA will remain in effect (meaning this test can be used) for the duration of the COVID-19 declaration under Section 564(b)(1) of the Act, 21 U.S.C. section 360bbb-3(b)(1), unless the authorization is terminated or revoked.     Resp Syncytial Virus by PCR NEGATIVE NEGATIVE Final    Comment: (NOTE) Fact Sheet for Patients: BloggerCourse.com  Fact Sheet for Healthcare Providers: SeriousBroker.it  This test is not yet approved or cleared by the [place]  FDA and has been authorized for detection and/or diagnosis of SARS-CoV-2 by FDA under an Emergency Use Authorization (EUA). This EUA will remain in effect (meaning this test can be used) for the duration of the COVID-19 declaration under Section 564(b)(1) of the Act, 21 U.S.C. section 360bbb-3(b)(1), unless the authorization is terminated or revoked.  Performed at [facility], [address], [place], [place] [address]      Labs: Basic Metabolic Panel: Recent Labs  Lab 04/02/24 1913 04/03/24 0808 04/04/24 0438 04/05/24 0323 04/06/24 0251  NA 144 141 141 142 140  K 3.8 3.9 4.2 4.0 4.1  CL 110 103 107 103 102  CO2 24 26 23 28 27   GLUCOSE 94 116* 135* 134* 116*  BUN 11 8 17 20   25*  CREATININE 0.76 0.79 0.88 1.00 0.88  CALCIUM  9.4 9.1 8.6* 8.8* 9.0  MG  --   --  1.8 2.2 2.5*  PHOS  --   --   --  5.4*  --    Liver Function Tests: Recent Labs  Lab 04/02/24 1913  AST 24  ALT 39  ALKPHOS 79  BILITOT 1.0  PROT 6.3*  ALBUMIN 3.8   No results for input(s): "LIPASE", "AMYLASE" in the last 168 hours. No results for input(s): "AMMONIA" in the last 168 hours. CBC: Recent Labs  Lab 04/02/24 1913 04/04/24 0438  WBC 8.0 6.6  NEUTROABS 5.1  --   HGB 13.8 14.6  HCT 41.8 45.5  MCV 90.5 91.5  PLT 263 296   Cardiac Enzymes: No results for input(s): "CKTOTAL", "CKMB", "CKMBINDEX", "TROPONINI" in the last 168 hours. BNP: BNP (last 3 results) Recent Labs    04/02/24 1913  BNP 242.9*    ProBNP (last 3 results) No results for input(s): "PROBNP" in the last 8760 hours.  CBG: Recent Labs  Lab 04/05/24 0614 04/05/24 1120 04/05/24 1546 04/05/24 2106 04/06/24 0619  GLUCAP 106* 140* 138* 116* 113*       Signed:  Deforest Fast MD.  [facility] 04/06/2024, 12:04 PM

## 2024-04-06 NOTE — TOC Transition Note (Signed)
 Transition of Care [facility]) - Discharge Note   Patient Details  Name: JAHSI PADBERG MRN: [id number] Date of Birth: 1966-04-18  Transition of Care [facility]) CM/SW Contact:  Omie Bickers, RN Phone Number: 04/06/2024, 9:37 AM   Clinical Narrative:     Eldora Greet w patient at bedside.  Meds for DC will go through [facility] pharmacy. Provided him Jardiance  copay reduction card and explained use, he was appreciative.  No other TOC needs identified.   Final next level of care: Home/Self Care Barriers to Discharge: No Barriers Identified   Patient Goals and CMS Choice            Discharge Placement                       Discharge Plan and Services Additional resources added to the After Visit Summary for                                       Social Drivers of Health (SDOH) Interventions SDOH Screenings   Food Insecurity: No Food Insecurity (04/04/2024)  Housing: Low Risk  (04/05/2024)  Transportation Needs: No Transportation Needs (04/05/2024)  Utilities: Not At Risk (04/04/2024)  Alcohol Screen: Low Risk  (04/05/2024)  Depression (PHQ2-9): Low Risk  (04/02/2024)  Financial Resource Strain: Low Risk  (04/05/2024)  Physical Activity: Sufficiently Active (05/20/2023)  Social Connections: Unknown (05/20/2023)  Stress: No Stress Concern Present (05/20/2023)  Tobacco Use: Medium Risk (04/02/2024)     Readmission Risk Interventions     No data to display

## 2024-04-06 NOTE — Progress Notes (Signed)
   Patient Name: Eric Frederick Date of Encounter: 04/06/2024 [facility] Cardiologist: None   Interval Summary  .    Ambulating in room feeling well, no significant shortness of breath.  Ready go home  Vital Signs .    Vitals:   04/05/24 1958 04/06/24 0054 04/06/24 0430 04/06/24 0725  BP: 128/80 131/72 (!) 148/76 127/81  Pulse: 83 73 62 82  Resp: 20 16 19 20   Temp: 98.3 F (36.8 C) 98.2 F (36.8 C) 98.2 F (36.8 C) 98.1 F (36.7 C)  TempSrc: Oral Oral Oral Oral  SpO2: 93% 95% 91% 92%  Weight:   116.2 kg   Height:        Intake/Output Summary (Last 24 hours) at 04/06/2024 0900 Last data filed at 04/06/2024 0655 Gross per 24 hour  Intake 480 ml  Output 3200 ml  Net -2720 ml      04/06/2024    4:30 AM 04/05/2024    4:11 AM 04/04/2024   11:47 AM  Last 3 Weights  Weight (lbs) 256 lb 2.8 oz 260 lb 9.3 oz 262 lb 5.6 oz  Weight (kg) 116.2 kg 118.2 kg 119 kg      Telemetry/ECG    NSR - Personally Reviewed Echocardiogram-EF 50 to 55% grade 1 diastolic dysfunction normal RV  Physical Exam .   GEN: No acute distress.   Neck: No JVD Cardiac: RRR, no murmurs, rubs, or gallops.  Respiratory: Clear to auscultation bilaterally. GI: Soft, nontender, non-distended  MS: No edema  Assessment & Plan .     58 year old with acute diastolic heart failure NSVT diabetes hypertension morbid obesity obesity BMI 43  Acute diastolic heart failure - Yesterday out 3.5 L the day before 5.5 L creatinine stable at 0.8 potassium 4.1 Lasix  40 mg IV twice daily -Coreg  6.25 mg twice a day, Jardiance  10 mg a day, losartan  25 mg a day, spironolactone  12.5 mg a day, BP 127/81 -Okay with transition to Lasix  40 mg once daily with as needed and have follow-up in our [facility] clinic.  Scheduled for 04/12/2024 at 11 AM.  Nonsustained ventricular tachycardia - Previously 24 beat run of VT asymptomatic magnesium  and potassium given.  This prompted cardiology evaluation.  No further VT.  Diabetes  with hypertension and morbid obesity - Resume metformin  on discharge.  On Mounjaro, hemoglobin A1c 4.9.  Discussed with Dr. Drexel Gentles  For questions or updates, please contact [facility] Please consult [email or web address] for contact info under        Signed, Dorothye Gathers, MD

## 2024-04-08 ENCOUNTER — Telehealth: Payer: Self-pay

## 2024-04-08 NOTE — Transitions of Care (Post Inpatient/ED Visit) (Signed)
   04/08/2024  Name: Eric Frederick MRN: [id number] DOB: [birth year]  Today's TOC FU Call Status: Today's TOC FU Call Status:: Unsuccessful Call (1st Attempt) Unsuccessful Call (1st Attempt) Date: 04/08/24  Attempted to reach the patient regarding the most recent Inpatient/ED visit.  Follow Up Plan: Additional outreach attempts will be made to reach the patient to complete the Transitions of Care (Post Inpatient/ED visit) call.   [place], RN, BSN, CCM [facility], [facility] RN Care Manager Direct Dial: [phone]

## 2024-04-08 NOTE — Transitions of Care (Post Inpatient/ED Visit) (Signed)
 04/08/2024  Name: Eric Frederick MRN: [id number] DOB: 16-Nov-1966  Today's TOC FU Call Status:  Declines 30 day program Today's TOC FU Call Status:: Successful TOC FU Call Completed Patient's Name and Date of Birth confirmed.  Transition Care Management Follow-up Telephone Call Date of Discharge: 04/06/24 Discharge Facility: [facility]) Type of Discharge: Inpatient Admission Primary Inpatient Discharge Diagnosis:: Heart Failure COPD How have you been since you were released from the hospital?: Better Any questions or concerns?: No  Items Reviewed: Did you receive and understand the discharge instructions provided?: Yes Medications obtained,verified, and reconciled?: Yes (Medications Reviewed)  Medications Reviewed Today: Medications Reviewed Today     Reviewed by Jamie Mccoy, RN (Registered Nurse) on 04/08/24 at 1247  Med List Status: <None>   Medication Order Taking? Sig Documenting Provider Last Dose Status Informant  albuterol  (VENTOLIN  HFA) 108 (90 Base) MCG/ACT inhaler [id number] No Inhale 2 puffs into the lungs every 6 (six) hours as needed for wheezing or shortness of breath.  Patient not taking: Reported on 04/08/2024   Tona Francis, NP Not Taking Active Self, Spouse/Significant Other, Pharmacy Records           Med Note Miriam American, Josepha Nickels Apr 08, 2024 11:26 AM) Taking only as needed  ascorbic acid (VITAMIN C) 1000 MG tablet [id number] Yes Take 1,000 mg by mouth daily. [provider] Taking Active Self, Spouse/Significant Other, Pharmacy Records  carvedilol  (COREG ) 6.25 MG tablet [id number] Yes Take 1 tablet (6.25 mg total) by mouth 2 (two) times daily with a meal. Deforest Fast, MD Taking Active   Cinnamon 500 MG TABS [id number] Yes Take 1,000 mg by mouth daily. [provider] Taking Active Self, Spouse/Significant Other, Pharmacy Records  empagliflozin  (JARDIANCE ) 10 MG TABS tablet [id number] Yes Take 1 tablet (10 mg total) by mouth  daily. Deforest Fast, MD Taking Active   furosemide  (LASIX ) 40 MG tablet [id number] Yes Take 1 tablet (40 mg total) by mouth daily. TAke an extra dose for weight gain of 3lb in 1 day or 5lb in 1 week Deforest Fast, MD Taking Active            Med Note Miriam American, Josepha Nickels Apr 08, 2024 11:28 AM) Taking daily and as needed  losartan  (COZAAR ) 25 MG tablet [id number] Yes Take 1 tablet (25 mg total) by mouth daily. Deforest Fast, MD Taking Active   metFORMIN  (GLUCOPHAGE ) 1000 MG tablet [id number] Yes Take 1 tablet (1,000 mg total) by mouth 2 (two) times daily. Scherrie Curt, MD Taking Active Self, Spouse/Significant Other, Pharmacy Records  Multiple Vitamins-Minerals (MULTIVITAMIN MEN 50+) TABS [id number] Yes Take 1 tablet by mouth daily. [provider] Taking Active Self, Spouse/Significant Other, Pharmacy Records  omeprazole  (PRILOSEC) 40 MG capsule [id number] Yes Take 1 capsule (40 mg total) by mouth daily. Copland, Jolena Nay, MD Taking Active Self, Spouse/Significant Other, Pharmacy Records  sildenafil  (VIAGRA ) 100 MG tablet [id number] Yes Take 0.5-1 tablets (50-100 mg total) by mouth daily as needed for erectile dysfunction. Copland, Jolena Nay, MD Taking Active Self, Spouse/Significant Other, Pharmacy Records  spironolactone  (ALDACTONE ) 25 MG tablet [id number] Yes Take 0.5 tablets (12.5 mg total) by mouth daily. Deforest Fast, MD Taking Active   tirzepatide [facility]) 10 MG/0.5ML Pen [id number] Yes Inject 10 mg into the skin once a week. Scherrie Curt, MD Taking Active Self, Spouse/Significant Other, Pharmacy Records           Med Note (LEE, NICOLE   Wed Apr 03, 2024  6:50  AM) Pt injects on Friday               Follow up appointments reviewed:  04/12/24 11 AM [facility] TOC     SDOH Interventions Today    Flowsheet Row Most Recent Value  SDOH Interventions   Food Insecurity Interventions Intervention Not Indicated  Housing Interventions Intervention Not Indicated   Transportation Interventions Intervention Not Indicated  Utilities Interventions Intervention Not Indicated       [place], RN, BSN, CCM [place]  [facility], [facility] RN Care Manager Direct Dial: [phone]

## 2024-04-11 NOTE — Progress Notes (Signed)
 HEART & VASCULAR TRANSITION OF CARE CONSULT NOTE    Referring Physician:Dr. Drexel Gentles PCP: Scherrie Curt, MD   Chief Complaint: Diastolic heart failure  HPI: Referred to clinic by Dr. Drexel Gentles  for heart failure consultation.   Eric Frederick is a 58 y.o. male with diastolic heart failure, hypertension, obesity, dyslipidemia and DM 2.   Admitted 4/25 with DOE, dry cough, wheezing and edema.  Workup revealed diastolic HF and pneumonia. D-dimer was elevated, CTA negative for PE.  IV Lasix  and antibiotics started.  Echo showed EF 50 to 55%,GIDD, normal RV.  While admitted he had a 24 beat run of asymptomatic VT, electrolytes repleted.  Diuresed~17 lbs. GDMT started.  Today he presents for [facility] clinic visit with his wife. Overall feeling good. Denies palpitations, CP, dizziness, edema, or PND/Orthopnea. Denies SOB. Appetite ok. No fever or chills. Weight at home 252-254 pounds. Taking all medications. Denies ETOH, tobacco or drug use. No family history of heart disease. 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   Past Medical History:  Diagnosis Date   Diabetes mellitus    Diverticulosis    GERD (gastroesophageal reflux disease)    Hiatal hernia 04/22/2015   Hyperlipidemia    Hypertension    Serrated adenoma of colon 06/2016    Current Outpatient Medications  Medication Sig Dispense Refill   albuterol  (VENTOLIN  HFA) 108 (90 Base) MCG/ACT inhaler Inhale 2 puffs into the lungs every 6 (six) hours as needed for wheezing or shortness of breath. 8 g 0   ascorbic acid (VITAMIN C) 1000 MG tablet Take 1,000 mg by mouth daily.     carvedilol  (COREG ) 6.25 MG tablet Take 1 tablet (6.25 mg total) by mouth 2 (two) times daily with a meal. 60 tablet 1   Cinnamon 500 MG TABS Take 1,000 mg by mouth daily.     empagliflozin  (JARDIANCE ) 10 MG TABS tablet Take 1 tablet (10 mg total) by mouth daily. 30 tablet 1   furosemide  (LASIX ) 40 MG tablet Take 1 tablet (40 mg total)  by mouth daily. TAke an extra dose for weight gain of 3lb in 1 day or 5lb in 1 week 60 tablet 1   losartan  (COZAAR ) 25 MG tablet Take 1 tablet (25 mg total) by mouth daily. 30 tablet 1   metFORMIN  (GLUCOPHAGE ) 1000 MG tablet Take 1 tablet (1,000 mg total) by mouth 2 (two) times daily. 180 tablet 3   Multiple Vitamins-Minerals (MULTIVITAMIN MEN 50+) TABS Take 1 tablet by mouth daily.     omeprazole  (PRILOSEC) 40 MG capsule Take 1 capsule (40 mg total) by mouth daily. 90 capsule 3   sildenafil  (VIAGRA ) 100 MG tablet Take 0.5-1 tablets (50-100 mg total) by mouth daily as needed for erectile dysfunction. 5 tablet 11   spironolactone  (ALDACTONE ) 25 MG tablet Take 0.5 tablets (12.5 mg total) by mouth daily. 30 tablet 1   tirzepatide (MOUNJARO) 10 MG/0.5ML Pen Inject 10 mg into the skin once a week. 8 mL 3   No current facility-administered medications for this encounter.    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[employer] [place]  Tobacco Use   Smoking status: Former    Current packs/day: 0.00    Types: Cigarettes    Quit date: 03/02/1991    Years since quitting: 33.1   Smokeless tobacco: Never  Vaping Use   Vaping status: Never Used  Substance and Sexual Activity   Alcohol use: No    Alcohol/week: 0.0 standard drinks of alcohol   Drug use: No   Sexual activity: Not on file  Other Topics Concern   Not on file  Social History Narrative   Not on file   Social Drivers of Health   Financial Resource Strain: Low Risk  (04/05/2024)   Overall Financial Resource Strain (CARDIA)    Difficulty of Paying Living Expenses: Not very hard  Food Insecurity: No Food  Insecurity (04/08/2024)   Hunger Vital Sign    Worried About Running Out of Food in the Last Year: Never true    Ran Out of Food in the Last Year: Never true  Transportation Needs: No Transportation Needs (04/08/2024)   PRAPARE - Administrator, Civil Service (Medical): No    Lack of Transportation (Non-Medical): No  Physical Activity: Sufficiently Active (05/20/2023)   Exercise Vital Sign    Days of Exercise per Week: 5 days    Minutes of Exercise per Session: 150+ min  Stress: No Stress Concern Present (05/20/2023)   [employer] of Occupational Health - Occupational Stress Questionnaire    Feeling of Stress : Not at all  Social Connections: Unknown (05/20/2023)   Social Connection and Isolation Panel [NHANES]    Frequency of Communication with Friends and Family: Patient declined    Frequency of Social Gatherings with Friends and Family: Patient declined    Attends Religious Services: More than 4 times per year    Active Member of [employer] or Organizations: Yes    Attends Banker Meetings: Patient declined    Marital Status: Married  Catering manager Violence: Not At Risk (04/08/2024)   Humiliation, Afraid, Rape, and Kick questionnaire    Fear of Current or Ex-Partner: No    Emotionally Abused: No    Physically Abused: No    Sexually Abused: No      Family History  Problem Relation Age of Onset   Other Father        complications from agent orange   Colon cancer Neg Hx    Rectal cancer Neg Hx    Stomach cancer Neg Hx    Esophageal cancer Neg Hx     Vitals:   04/12/24 1058  BP: (!) 143/123  Pulse: 98  SpO2: 95%  Weight: 117.7 kg (259 lb 6.4 oz)  Height: 5\' 5"  (1.651 m)    PHYSICAL EXAM: General:  well appearing.  No respiratory difficulty. Walked into clinic Neck: supple. JVD flat.  Cor: PMI nondisplaced. Irregular rate & rhythm. No rubs, gallops or murmurs. Lungs: clear Extremities: no cyanosis, clubbing, rash, edema  Neuro: alert &  oriented x 3. Moves all 4 extremities w/o difficulty. Affect pleasant.   Wt Readings from Last 3 Encounters:  04/12/24 117.7 kg (259 lb 6.4 oz)  04/06/24 116.2 kg (256 lb 2.8 oz)  04/02/24 124.1 kg (273 lb 9.6 oz)     ECG: a fib RVR 131 bpm (Personally reviewed and reviewed with Dr. Alease Amend)     ASSESSMENT & PLAN: Atrial fibrillation with RVR - New today. EKG with a fib RVR 131 bpm. Asymptomatic - Start  Eliquis 5 mg BID. Baseline CBC today.  - Arrange DCCV in 3 weeks with Dr. Alease Amend - Discussed sleep study, he wants to hold off at this time - Increase coreg  to 12.5 mg BID - Plan discussed with Dr. Alease Amend  Recently diagnosed diastolic heart failure - Echo 1/61 EF 50 to 55%,GIDD, normal RV.   - HTN CM? Tachy mediated? Though a fib appears to be new NYHA I-II, volume appears stable GDMT  Diuretic- Continue lasix  40 mg daily BB-  Increase coreg  6.25>12.5 mg BID Ace/ARB/ARNI Continue losartan  25 mg daily MRA Increase spiro 12.5>25 mg daily  SGLT2i  Continue Jardiance  10 mg daily - Labs today, repeat in ~10 days.   Hx of VT - 24 beat run while admitted. Asymptomatic  - In A fib RVR today. See #1 - BMET today  Hypertension - BP slightly elevated, improved on recheck. Increase meds at above.   Obesity - Body mass index is 43.17 kg/m.  - Continue Mounjaro  Work note given today.   Referred to HFSW (PCP, Medications, Transportation, ETOH Abuse, Drug Abuse, Insurance, Financial ):  No Refer to Pharmacy: No Refer to Home Health:  No Refer to Advanced Heart Failure Clinic: Yes Refer to General Cardiology: No  Follow up post DCCV in 5-6 weeks with Dr. Alease Amend.

## 2024-04-12 ENCOUNTER — Other Ambulatory Visit (HOSPITAL_COMMUNITY): Payer: Self-pay

## 2024-04-12 ENCOUNTER — Encounter (HOSPITAL_COMMUNITY): Payer: Self-pay | Admitting: Internal Medicine

## 2024-04-12 ENCOUNTER — Encounter (HOSPITAL_COMMUNITY): Payer: Self-pay

## 2024-04-12 ENCOUNTER — Ambulatory Visit (HOSPITAL_COMMUNITY)
Admit: 2024-04-12 | Discharge: 2024-04-12 | Disposition: A | Discharge status: Home / Self Care | Attending: Internal Medicine | Admitting: Internal Medicine

## 2024-04-12 VITALS — BP 132/85 | HR 98 | Ht 65.0 in | Wt 259.4 lb

## 2024-04-12 DIAGNOSIS — E669 Obesity, unspecified: Secondary | ICD-10-CM | POA: Diagnosis not present

## 2024-04-12 DIAGNOSIS — Z79899 Other long term (current) drug therapy: Secondary | ICD-10-CM | POA: Diagnosis not present

## 2024-04-12 DIAGNOSIS — I4891 Unspecified atrial fibrillation: Secondary | ICD-10-CM | POA: Insufficient documentation

## 2024-04-12 DIAGNOSIS — R7989 Other specified abnormal findings of blood chemistry: Secondary | ICD-10-CM | POA: Diagnosis not present

## 2024-04-12 DIAGNOSIS — I5032 Chronic diastolic (congestive) heart failure: Secondary | ICD-10-CM | POA: Insufficient documentation

## 2024-04-12 DIAGNOSIS — I110 Hypertensive heart disease with heart failure: Secondary | ICD-10-CM | POA: Insufficient documentation

## 2024-04-12 DIAGNOSIS — Z6841 Body Mass Index (BMI) 40.0 and over, adult: Secondary | ICD-10-CM | POA: Insufficient documentation

## 2024-04-12 DIAGNOSIS — I10 Essential (primary) hypertension: Secondary | ICD-10-CM | POA: Diagnosis not present

## 2024-04-12 DIAGNOSIS — I5031 Acute diastolic (congestive) heart failure: Secondary | ICD-10-CM | POA: Diagnosis present

## 2024-04-12 DIAGNOSIS — Z7984 Long term (current) use of oral hypoglycemic drugs: Secondary | ICD-10-CM | POA: Insufficient documentation

## 2024-04-12 DIAGNOSIS — Z8679 Personal history of other diseases of the circulatory system: Secondary | ICD-10-CM

## 2024-04-12 DIAGNOSIS — Z7901 Long term (current) use of anticoagulants: Secondary | ICD-10-CM | POA: Diagnosis not present

## 2024-04-12 DIAGNOSIS — E66813 Obesity, class 3: Secondary | ICD-10-CM

## 2024-04-12 LAB — CBC
HCT: 50.3 % (ref 39.0–52.0)
Hemoglobin: 15.8 g/dL (ref 13.0–17.0)
MCH: 28.2 pg (ref 26.0–34.0)
MCHC: 31.4 g/dL (ref 30.0–36.0)
MCV: 89.7 fL (ref 80.0–100.0)
Platelets: 319 10*3/uL (ref 150–400)
RBC: 5.61 MIL/uL (ref 4.22–5.81)
RDW: 12.3 % (ref 11.5–15.5)
WBC: 8.1 10*3/uL (ref 4.0–10.5)
nRBC: 0 % (ref 0.0–0.2)

## 2024-04-12 LAB — BASIC METABOLIC PANEL WITH GFR
Anion gap: 12 (ref 5–15)
BUN: 17 mg/dL (ref 6–20)
CO2: 24 mmol/L (ref 22–32)
Calcium: 9.4 mg/dL (ref 8.9–10.3)
Chloride: 104 mmol/L (ref 98–111)
Creatinine, Ser: 0.73 mg/dL (ref 0.61–1.24)
GFR, Estimated: >60 mL/min (ref >60)
Glucose, Bld: 125 mg/dL — ABNORMAL HIGH (ref 70–99)
Potassium: 4.1 mmol/L (ref 3.5–5.1)
Sodium: 140 mmol/L (ref 135–145)

## 2024-04-12 LAB — BRAIN NATRIURETIC PEPTIDE: B Natriuretic Peptide: 32.8 pg/mL (ref 0.0–100.0)

## 2024-04-12 MED ORDER — CARVEDILOL 12.5 MG PO TABS: 12.5000 mg | ORAL_TABLET | Freq: Two times a day (BID) | ORAL | 5 refills | Status: DC

## 2024-04-12 MED ORDER — SPIRONOLACTONE 25 MG PO TABS: 25.0000 mg | ORAL_TABLET | Freq: Every day | ORAL | 5 refills | Status: DC

## 2024-04-12 MED ORDER — APIXABAN 5 MG PO TABS: 5.0000 mg | ORAL_TABLET | Freq: Two times a day (BID) | ORAL | 5 refills | Status: DC

## 2024-04-12 NOTE — Patient Instructions (Addendum)
 Medication Changes:  INCREASE CARVEDILOL  TO 12.5MG  TWICE DAILY   INCREASE SPIRONOLACTONE  TO 25MG  ONCE DAILY   START: ELIQUIS 5MG  TWICE DAILY- 30 DAY FREE TRIAL CARD GIVEN, AND 10$ COPAY CARD GIVEN  Lab Work:  Labs done today, your results will be available in MyChart, we will contact you for abnormal readings.  Testing/Procedures:   Dear Eric Frederick   You are scheduled for a Cardioversion on Wednesday, May 28 with Dr. Alease Amend.  Please arrive at the [facility] (Main Entrance A) at [facility]: [address] [place], [place] [address] at 6:30 AM (This time is 1 hour(s) before your procedure to ensure your preparation).   Free valet parking service is available. You will check in at ADMITTING.   *Please Note: You will receive a call the day before your procedure to confirm the appointment time. That time may have changed from the original time based on the schedule for that day.*   DIET:  Nothing to eat or drink after midnight except a sip of water with medications (see medication instructions below)  MEDICATION INSTRUCTIONS: !!IF ANY NEW MEDICATIONS ARE STARTED AFTER TODAY, PLEASE NOTIFY YOUR PROVIDER AS SOON AS POSSIBLE!!  FYI: Medications such as Semaglutide  (Ozempic , Wegovy ), Tirzepatide (Mounjaro, Zepbound), Dulaglutide  (Trulicity ), etc ("GLP1 agonists") AND Canagliflozin (Invokana), Dapagliflozin (Farxiga), Empagliflozin  (Jardiance ), Ertugliflozin (Steglatro), Bexagliflozin Arboriculturist) or any combination with one of these drugs such as Invokamet (Canagliflozin/Metformin ), Synjardy (Empagliflozin /Metformin ), etc ("SGLT2 inhibitors") must be held around the time of a procedure. This is not a comprehensive list of all of these drugs. Please review all of your medications and talk to your provider if you take any one of these. If you are not sure, ask your provider.   DO NOT TAKE SPIRONOLACTONE  THE MORNING OF PROCEDURE   DO NOT TAKE FUROSEMIDE  THE MORNING OF PROCEDURE   DO  NOT TAKE METFORMIN  THE MORNING OF PROCEDURE   HOLD: Tirzepatide (Mounjaro, Zepbound) for 7 days prior to the procedure. Last dose on Friday, May 16  HOLD: Empagliflozin  (Jardiance ) for 3 days prior to the procedure. Last dose on Saturday, May 24.   MAKE SURE TO NOT MISS ANY DOSES OF ELIQUIS- PLEASE NOTIFY US  IF YOU DO  LABS: TODAY  FYI:  For your safety, and to allow us  to monitor your vital signs accurately during the surgery/procedure we request: If you have artificial nails, gel coating, SNS etc, please have those removed prior to your surgery/procedure. Not having the nail coverings /polish removed may result in cancellation or delay of your surgery/procedure.  Your support person will be asked to wait in the waiting room during your procedure.  It is OK to have someone drop you off and come back when you are ready to be discharged.  You cannot drive after the procedure and will need someone to drive you home.  Bring your insurance cards.  *Special Note: Every effort is made to have your procedure done on time. Occasionally there are emergencies that occur at the hospital that may cause delays. Please be patient if a delay does occur.   Follow-Up in: 6 WEEKS AS SCHEDULED   At the Advanced Heart Failure Clinic, you and your health needs are our priority. We have a designated team specialized in the treatment of Heart Failure. This Care Team includes your primary Heart Failure Specialized Cardiologist (physician), Advanced Practice Providers (APPs- Physician Assistants and Nurse Practitioners), and Pharmacist who all work together to provide you with the care you need, when you need  it.   You may see any of the following providers on your designated Care Team at your next follow up:  Dr. Jules Oar Dr. Peder Bourdon Dr. Alwin Baars Dr. Judyth Nunnery Nieves Bars, NP Ruddy Corral, [place] [facility] [place], [place] Dennise Fitz, NP [place] Lee, NP Luster Salters,  PharmD   Please be sure to bring in all your medications bottles to every appointment.   Need to Contact Us :  If you have any questions or concerns before your next appointment please send us  a message through [place] or call our office at [phone].    TO LEAVE A MESSAGE FOR THE NURSE SELECT OPTION 2, PLEASE LEAVE A MESSAGE INCLUDING: YOUR NAME DATE OF BIRTH CALL BACK NUMBER REASON FOR CALL**this is important as we prioritize the call backs  YOU WILL RECEIVE A CALL BACK THE SAME DAY AS LONG AS YOU CALL BEFORE 4:00 PM

## 2024-04-12 NOTE — Progress Notes (Signed)
 Orders placed for DCCV on 5/28 with Dr. Alease Amend.

## 2024-04-23 ENCOUNTER — Other Ambulatory Visit (HOSPITAL_COMMUNITY): Payer: Self-pay | Admitting: Cardiology

## 2024-04-23 MED ORDER — SPIRONOLACTONE 25 MG PO TABS: 25.0000 mg | ORAL_TABLET | Freq: Every day | ORAL | 5 refills | Status: DC

## 2024-04-28 NOTE — Pre-Procedure Instructions (Signed)
 Spoke with patient regarding procedure on 5/28 with anesthesia.  Anesthesia requires Mounjaro to be held 7 days before procedure.  He takes on Fridays, his last dose will be 5/16 until after procedure.

## 2024-05-02 ENCOUNTER — Other Ambulatory Visit (HOSPITAL_COMMUNITY): Payer: Self-pay

## 2024-05-03 ENCOUNTER — Other Ambulatory Visit (HOSPITAL_COMMUNITY): Payer: Self-pay

## 2024-05-07 NOTE — Progress Notes (Signed)
 Pt called for pre procedure instructions. Arrival time 0630 NPO after midnight explained Instructed to take am meds with sip of water and confirmed no missed doses of eliquis  and to take in am.   Instructed pt need for ride home tomorrow and have responsible adult with them for 24 hrs post procedure.

## 2024-05-08 ENCOUNTER — Ambulatory Visit (HOSPITAL_COMMUNITY)

## 2024-05-08 ENCOUNTER — Ambulatory Visit (HOSPITAL_COMMUNITY)
Admission: RE | Admit: 2024-05-08 | Discharge: 2024-05-08 | Disposition: A | Discharge status: Home / Self Care | Attending: Cardiology | Admitting: Cardiology

## 2024-05-08 ENCOUNTER — Encounter (HOSPITAL_COMMUNITY)
Admission: RE | Disposition: A | Discharge status: Home / Self Care | Payer: Self-pay | Source: Home / Self Care | Attending: Cardiology

## 2024-05-08 DIAGNOSIS — I4891 Unspecified atrial fibrillation: Secondary | ICD-10-CM | POA: Diagnosis not present

## 2024-05-08 DIAGNOSIS — Z538 Procedure and treatment not carried out for other reasons: Secondary | ICD-10-CM | POA: Insufficient documentation

## 2024-05-08 SURGERY — CARDIOVERSION (CATH LAB)
Anesthesia: Monitor Anesthesia Care

## 2024-05-08 NOTE — H&P (Addendum)
 Advanced Heart Failure Team History and Physical Note   PCP:  Scherrie Curt, MD  PCP-Cardiology: None     Reason for Admission:    HPI:   Eric Frederick is a 58 y.o. male with diastolic heart failure, hypertension, obesity, dyslipidemia and DM 2.   Here for scheduled cardioversion, has been compliant with his anticoagulation. Currently in NSR      Home Medications Prior to Admission medications   Medication Sig Start Date End Date Taking? Authorizing Provider  apixaban  (ELIQUIS ) 5 MG TABS tablet Take 1 tablet (5 mg total) by mouth 2 (two) times daily. 04/12/24  Yes Sheryl Donna, NP  carvedilol  (COREG ) 12.5 MG tablet Take 1 tablet (12.5 mg total) by mouth 2 (two) times daily with a meal. 04/12/24  Yes Sheryl Donna, NP  CINNAMON PO Take 1 tablet by mouth daily.   Yes [provider]  empagliflozin  (JARDIANCE ) 10 MG TABS tablet Take 1 tablet (10 mg total) by mouth daily. 04/07/24  Yes Deforest Fast, MD  furosemide  (LASIX ) 40 MG tablet Take 1 tablet (40 mg total) by mouth daily. TAke an extra dose for weight gain of 3lb in 1 day or 5lb in 1 week 04/06/24  Yes Deforest Fast, MD  losartan  (COZAAR ) 25 MG tablet Take 1 tablet (25 mg total) by mouth daily. 04/07/24  Yes Deforest Fast, MD  metFORMIN  (GLUCOPHAGE ) 1000 MG tablet Take 1 tablet (1,000 mg total) by mouth 2 (two) times daily. 11/22/23  Yes Copland, Jolena Nay, MD  Multiple Vitamins-Minerals (MULTIVITAMIN MEN 50+) TABS Take 1 tablet by mouth daily.   Yes [provider]  omeprazole  (PRILOSEC) 40 MG capsule Take 1 capsule (40 mg total) by mouth daily. 11/22/23  Yes Copland, Jolena Nay, MD  sildenafil  (VIAGRA ) 100 MG tablet Take 0.5-1 tablets (50-100 mg total) by mouth daily as needed for erectile dysfunction. 11/22/23  Yes Copland, Jolena Nay, MD  spironolactone  (ALDACTONE ) 25 MG tablet Take 1 tablet (25 mg total) by mouth daily. 04/23/24  Yes Sheryl Donna, NP  tirzepatide [facility]) 10 MG/0.5ML Pen Inject 10 mg into the  skin once a week. 11/22/23  Yes Scherrie Curt, MD    Past Medical History: Past Medical History:  Diagnosis Date   Diabetes mellitus    Diverticulosis    GERD (gastroesophageal reflux disease)    Hiatal hernia 04/22/2015   Hyperlipidemia    Hypertension    Serrated adenoma of colon 06/2016    Past Surgical History: Past Surgical History:  Procedure Laterality Date   KNEE ARTHROPLASTY Right    2023   SHOULDER ARTHROSCOPY W/ ROTATOR CUFF REPAIR Right 12/12/2012   [place]   TOTAL KNEE ARTHROPLASTY Left    2022    Family History:  Family History  Problem Relation Age of Onset   Other Father        complications from agent orange   Colon cancer Neg Hx    Rectal cancer Neg Hx    Stomach cancer Neg Hx    Esophageal cancer Neg Hx     Social History: Social History   Socioeconomic History   Marital status: Married    Spouse name: Hettie Lota   Number of children: 1   Years of education: Not on file   Highest education level: Bachelor's degree (e.g., BA, AB, BS)  Occupational History   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[employer]  Tobacco Use   Smoking status: Former    Current packs/day: 0.00    Types: Cigarettes  Quit date: 03/02/1991    Years since quitting: 33.2   Smokeless tobacco: Never  Vaping Use   Vaping status: Never Used  Substance and Sexual Activity   Alcohol use: No    Alcohol/week: 0.0 standard drinks of alcohol   Drug use: No   Sexual activity: Not on file  Other Topics Concern   Not on file  Social History Narrative   Not on file   Social Drivers of Health   Financial Resource Strain: Low Risk  (04/05/2024)   Overall Financial Resource Strain (CARDIA)    Difficulty of Paying Living Expenses: Not very hard  Food Insecurity: No Food Insecurity (04/08/2024)   Hunger Vital Sign    Worried About Running Out of Food in the Last Year: Never true    Ran Out of Food in the Last Year: Never true  Transportation Needs: No Transportation Needs  (04/08/2024)   PRAPARE - Administrator, Civil Service (Medical): No    Lack of Transportation (Non-Medical): No  Physical Activity: Sufficiently Active (05/20/2023)   Exercise Vital Sign    Days of Exercise per Week: 5 days    Minutes of Exercise per Session: 150+ min  Stress: No Stress Concern Present (05/20/2023)   [employer] of Occupational Health - Occupational Stress Questionnaire    Feeling of Stress : Not at all  Social Connections: Unknown (05/20/2023)   Social Connection and Isolation Panel [NHANES]    Frequency of Communication with Friends and Family: Patient declined    Frequency of Social Gatherings with Friends and Family: Patient declined    Attends Religious Services: More than 4 times per year    Active Member of [employer] or Organizations: Yes    Attends Banker Meetings: Patient declined    Marital Status: Married    Allergies:  Allergies  Allergen Reactions   Rosuvastatin  Hives   Glipizide  Hives, Itching and Rash    Objective:    Vital Signs:       There were no vitals filed for this visit.    Patient Profile  Eric Frederick is a 58 y.o. male with diastolic heart failure, hypertension, obesity, dyslipidemia and DM 2. He is here for scheduled cardioversion after 3 weeks of anticoagulation.    Assessment/Plan   Atrial fibrillation: Converted to NSR. Procedure cancelled, follow up in clinic.    Lauralee Poll, MD 05/08/2024, 9:10 AM  Advanced Heart Failure Team Pager [phone] (M-F; 7a - 5p)  Please contact [facility] for night-coverage after hours (4p -7a ) and weekends on [email or web address]

## 2024-05-10 ENCOUNTER — Other Ambulatory Visit (HOSPITAL_COMMUNITY): Payer: Self-pay | Admitting: Cardiology

## 2024-05-10 MED ORDER — EMPAGLIFLOZIN 10 MG PO TABS: 10.0000 mg | ORAL_TABLET | Freq: Every day | ORAL | 1 refills | Status: DC

## 2024-05-17 ENCOUNTER — Telehealth (HOSPITAL_COMMUNITY): Payer: Self-pay

## 2024-05-17 ENCOUNTER — Ambulatory Visit (HOSPITAL_COMMUNITY)
Admission: RE | Admit: 2024-05-17 | Discharge: 2024-05-17 | Disposition: A | Discharge status: Home / Self Care | Source: Ambulatory Visit | Attending: Cardiology | Admitting: Cardiology

## 2024-05-17 VITALS — BP 124/70 | HR 72 | Wt 263.0 lb

## 2024-05-17 DIAGNOSIS — Z7901 Long term (current) use of anticoagulants: Secondary | ICD-10-CM | POA: Insufficient documentation

## 2024-05-17 DIAGNOSIS — E669 Obesity, unspecified: Secondary | ICD-10-CM | POA: Diagnosis not present

## 2024-05-17 DIAGNOSIS — I5031 Acute diastolic (congestive) heart failure: Secondary | ICD-10-CM | POA: Diagnosis not present

## 2024-05-17 DIAGNOSIS — I10 Essential (primary) hypertension: Secondary | ICD-10-CM | POA: Diagnosis not present

## 2024-05-17 DIAGNOSIS — G4730 Sleep apnea, unspecified: Secondary | ICD-10-CM | POA: Insufficient documentation

## 2024-05-17 DIAGNOSIS — I4891 Unspecified atrial fibrillation: Secondary | ICD-10-CM | POA: Diagnosis not present

## 2024-05-17 DIAGNOSIS — Z79899 Other long term (current) drug therapy: Secondary | ICD-10-CM | POA: Insufficient documentation

## 2024-05-17 DIAGNOSIS — E8881 Metabolic syndrome: Secondary | ICD-10-CM | POA: Insufficient documentation

## 2024-05-17 DIAGNOSIS — Z7984 Long term (current) use of oral hypoglycemic drugs: Secondary | ICD-10-CM | POA: Insufficient documentation

## 2024-05-17 DIAGNOSIS — I110 Hypertensive heart disease with heart failure: Secondary | ICD-10-CM | POA: Insufficient documentation

## 2024-05-17 DIAGNOSIS — I5032 Chronic diastolic (congestive) heart failure: Secondary | ICD-10-CM | POA: Insufficient documentation

## 2024-05-17 MED ORDER — LOSARTAN POTASSIUM 50 MG PO TABS: 50.0000 mg | ORAL_TABLET | Freq: Every day | ORAL | 3 refills | Status: DC

## 2024-05-17 MED ORDER — FUROSEMIDE 40 MG PO TABS
40.0000 mg | ORAL_TABLET | Freq: Every day | ORAL | Status: AC | PRN
Start: 2024-05-17 — End: ?

## 2024-05-17 NOTE — Telephone Encounter (Signed)
 Paper work faxed. Patient called and informed that forms have been sent

## 2024-05-17 NOTE — Progress Notes (Signed)
   ADVANCED HEART FAILURE NEW PATIENT CLINIC NOTE  Referring Physician: Scherrie Curt, MD  Primary Care: Eric Curt, MD Primary Cardiologist:  HPI: Eric Frederick is a 58 y.o. male with a PMH of diastolic heart failure, HTN, obesity, afib who presents for initial visit for further evaluation and treatment of heart failure/cardiomyopathy.      Admitted 4/25 with DOE, dry cough, wheezing and edema.  Workup revealed diastolic HF and pneumonia. D-dimer was elevated, CTA negative for PE.  IV Lasix  and antibiotics started.  Echo showed EF 50 to 55%,GIDD, normal RV.  While admitted he had a 24 beat run of asymptomatic VT, electrolytes repleted.  Diuresed~17 lbs. GDMT started.      SUBJECTIVE:  Patient overall reports that he is doing well since he was seen in the hospital. He feels that he has remained in NSR since his aborted cardioversion. He feels close to his baseline prior to admission. Continues to lose weight and has been breathing better. His BP is improved as well. Asking about going back to work, discussed this is reasonable with regular rest breaks.   PMH, current medications, allergies, social history, and family history reviewed in epic.  PHYSICAL EXAM: Vitals:   05/17/24 0952  BP: 124/70  Pulse: 72  SpO2: 95%   GENERAL: Well nourished and in no apparent distress at rest, obese PULM:  Normal work of breathing, clear to auscultation bilaterally. Respirations are unlabored.  CARDIAC:  JVP: flat         Normal rate with regular rhythm. No murmurs, rubs or gallops.  Trace edema. Warm and well perfused extremities. ABDOMEN: Soft, non-tender, non-distended. NEUROLOGIC: Patient is oriented x3 with no focal or lateralizing neurologic deficits.    DATA REVIEW  ECG: 05/17/24: NSR, sinus arrhythmia, normal QRS  ECHO: 04/04/24: LVEF 50-55%, no WMA, grade I DD, normal RV function and size  CATH: None    ASSESSMENT & PLAN:  HFpEF: Chronic, well compensated today and  improving on medical therapy.  Suspect due to combination of HTN, sleep apnea, and metabolic syndrome. - Change lasix  to prn - Increase losartan  to 50mg  daily, consider transition to entresto in future - Continue jardiance  10mg  daily, carvedilol  12.5mg  BID, spironolactone  25mg  daily for HFpEF - Improving and no advanced heart failure needs. Referral to general cardiology clinic  Atrial fibrillation: Currently in NSR. - Stressed the importance of his sleep study - Continue coreg  12.5mg  BID - Consider ablation in the future after weight loss and treating likely OSA - Continue apixaban  5mg  BID  Hypertension: Adjust BP meds as above. - Follow up with general cardiology  Obesity: Losing weight slowly. - Continue mounjaro  Follow up prn  Eric Lark, MD Advanced Heart Failure Mechanical Circulatory Support 05/20/24

## 2024-05-17 NOTE — Patient Instructions (Signed)
 CONGRATULATIONS YOU HAVE GRADUATED FROM THE HEART FAILURE CLINIC.  CHANGE Lasix  to as needed for weight gain of 3lb in 24 hours or 5 lb in a week.  INCREASE Losartan  to 50 mg daily.  You have been referred to General Cardiology. They will call you to arrange your appointment.  At the Advanced [facility] Failure Clinic, you and your health needs are our priority. As part of our continuing mission to provide you with exceptional heart care, we have created designated Provider Care Teams. These Care Teams include your primary Cardiologist (physician) and Advanced Practice Providers (APPs- Physician Assistants and Nurse Practitioners) who all work together to provide you with the care you need, when you need it.   You may see any of the following providers on your designated Care Team at your next follow up: Dr Jules Oar Dr Peder Bourdon Dr. Alwin Baars Dr. Arta Lark Amy Marijane Shoulders, NP Ruddy Corral, [place] [facility] [place], [place] Dennise Fitz, NP [place] Lee, NP Shawnee Dellen, NP Luster Salters, PharmD Bevely Brush, PharmD   Please be sure to bring in all your medications bottles to every appointment.    Thank you for choosing [facility]

## 2024-05-21 NOTE — Progress Notes (Unsigned)
     Jeidi Gilles T. Allsion Nogales, MD, CAQ Sports Medicine [facility] at [facility] [address] [place], [address]  Phone: [phone]  FAX: [phone]  JAYCION TREML - 58 y.o. male  MRN [id number]  Date of Birth: 03-28-1966  Date: 05/22/2024  PCP: Scherrie Curt, MD  Referral: Scherrie Curt, MD  No chief complaint on file.  Subjective:   Eric Frederick is a 58 y.o. very pleasant male patient with There is no height or weight on file to calculate BMI. who presents with the following:  Devell is a very nice patient who presents for follow-up diabetes, hypertension, hyperlipidemia.  He also has A-fib and is on Eliquis .  Diagnosis of heart failure.  Diabetes Mellitus: Tolerating Medications: yes  Jardiance  10 mg Metformin  1000 mg p.o. twice daily Mounjaro 10 mg pen  Compliance with diet: fair, There is no height or weight on file to calculate BMI. Exercise: minimal / intermittent Avg blood sugars at home: not checking Foot problems: none Hypoglycemia: none No nausea, vomitting, blurred vision, polyuria.  Lab Results  Component Value Date   HGBA1C 4.9 04/03/2024   HGBA1C 6.2 11/15/2023   HGBA1C 5.9 (A) 05/24/2023   Lab Results  Component Value Date   MICROALBUR 5.5 (H) 11/15/2023   LDLCALC 120 (H) 11/15/2023   CREATININE 0.73 04/12/2024    Wt Readings from Last 3 Encounters:  05/17/24 263 lb (119.3 kg)  04/12/24 259 lb 6.4 oz (117.7 kg)  04/06/24 256 lb 2.8 oz (116.2 kg)    HTN: Tolerating all medications without side effects Stable and at goal No CP, no sob. No HA.  BP Readings from Last 3 Encounters:  05/17/24 124/70  04/12/24 132/85  04/06/24 127/81    Basic Metabolic Panel:    Component Value Date/Time   NA 140 04/12/2024 1211   NA 137 10/29/2012 0146   K 4.1 04/12/2024 1211   K 3.4 (L) 10/29/2012 0146   CL 104 04/12/2024 1211   CL 102 10/29/2012 0146   CO2 24 04/12/2024 1211   CO2 26 10/29/2012 0146   BUN 17  04/12/2024 1211   BUN 13 10/29/2012 0146   CREATININE 0.73 04/12/2024 1211   CREATININE 0.91 10/29/2012 0146   GLUCOSE 125 (H) 04/12/2024 1211   GLUCOSE 203 (H) 10/29/2012 0146   CALCIUM  9.4 04/12/2024 1211   CALCIUM  7.9 (L) 10/29/2012 0146    Lipids: Doing well, stable. Tolerating meds fine with no SE. Panel reviewed with patient.  Lipids: Lab Results  Component Value Date   CHOL 180 11/15/2023   Lab Results  Component Value Date   HDL 50.40 11/15/2023   Lab Results  Component Value Date   LDLCALC 120 (H) 11/15/2023   Lab Results  Component Value Date   TRIG 47.0 11/15/2023   Lab Results  Component Value Date   CHOLHDL 4 11/15/2023    Lab Results  Component Value Date   ALT 39 04/02/2024   AST 24 04/02/2024   ALKPHOS 79 04/02/2024   BILITOT 1.0 04/02/2024     Review of Systems is noted in the HPI, as appropriate  Objective:   There were no vitals taken for this visit.  GEN: No acute distress; alert,appropriate. PULM: Breathing comfortably in no respiratory distress PSYCH: Normally interactive.   Laboratory and Imaging Data:  Assessment and Plan:   ***

## 2024-05-22 ENCOUNTER — Ambulatory Visit (INDEPENDENT_AMBULATORY_CARE_PROVIDER_SITE_OTHER): Payer: 59 | Admitting: Family Medicine

## 2024-05-22 ENCOUNTER — Encounter: Payer: Self-pay | Admitting: Family Medicine

## 2024-05-22 VITALS — BP 138/70 | HR 66 | Temp 97.8°F | Ht 64.25 in | Wt 262.0 lb

## 2024-05-22 DIAGNOSIS — E1159 Type 2 diabetes mellitus with other circulatory complications: Secondary | ICD-10-CM | POA: Diagnosis not present

## 2024-05-22 DIAGNOSIS — I10 Essential (primary) hypertension: Secondary | ICD-10-CM | POA: Diagnosis not present

## 2024-05-22 DIAGNOSIS — E7800 Pure hypercholesterolemia, unspecified: Secondary | ICD-10-CM | POA: Diagnosis not present

## 2024-05-22 DIAGNOSIS — Z7984 Long term (current) use of oral hypoglycemic drugs: Secondary | ICD-10-CM

## 2024-05-22 DIAGNOSIS — Z7985 Long-term (current) use of injectable non-insulin antidiabetic drugs: Secondary | ICD-10-CM

## 2024-05-22 DIAGNOSIS — Z23 Encounter for immunization: Secondary | ICD-10-CM

## 2024-05-22 DIAGNOSIS — I480 Paroxysmal atrial fibrillation: Secondary | ICD-10-CM | POA: Diagnosis not present

## 2024-05-22 DIAGNOSIS — N521 Erectile dysfunction due to diseases classified elsewhere: Secondary | ICD-10-CM

## 2024-05-22 MED ORDER — TIRZEPATIDE 15 MG/0.5ML ~~LOC~~ SOAJ: 15.0000 mg | SUBCUTANEOUS | 1 refills | Status: DC

## 2024-05-22 MED ORDER — METFORMIN HCL 500 MG PO TABS: 500.0000 mg | ORAL_TABLET | Freq: Two times a day (BID) | ORAL | 1 refills | Status: DC

## 2024-05-22 MED ORDER — FLUCONAZOLE 150 MG PO TABS: 300.0000 mg | ORAL_TABLET | ORAL | 0 refills | Status: DC

## 2024-05-22 MED ORDER — TIRZEPATIDE 12.5 MG/0.5ML ~~LOC~~ SOAJ: 12.5000 mg | SUBCUTANEOUS | 0 refills | Status: DC

## 2024-05-22 NOTE — Patient Instructions (Signed)
 Drop your Metformin  to 500 mg twice a day  Increase Mounjaro to 12.5 mg a week for 4 weeks, then  Increase to Mounjaro 15 mg a week

## 2024-06-06 ENCOUNTER — Other Ambulatory Visit: Payer: Self-pay | Admitting: Family Medicine

## 2024-06-06 MED ORDER — EMPAGLIFLOZIN 10 MG PO TABS: 10.0000 mg | ORAL_TABLET | Freq: Every day | ORAL | 1 refills | Status: AC

## 2024-06-06 MED ORDER — LOSARTAN POTASSIUM 50 MG PO TABS: 50.0000 mg | ORAL_TABLET | Freq: Every day | ORAL | 3 refills | Status: AC

## 2024-06-06 NOTE — Telephone Encounter (Signed)
 Copied from CRM [id number]. Topic: Clinical - Medication Refill >> Jun 06, 2024  3:23 PM [place] E wrote: Medication: losartan  (COZAAR ) 50 MG tablet empagliflozin  (JARDIANCE ) 10 MG TABS tablet  Has the patient contacted their pharmacy? No (Agent: If no, request that the patient contact the pharmacy for the refill. If patient does not wish to contact the pharmacy document the reason why and proceed with request.) (Agent: If yes, when and what did the pharmacy advise?)  This is the patient's preferred pharmacy:  [facility] [address] [place] - [address] [place] [address] Phone: [phone] Fax: [phone]   Is this the correct pharmacy for this prescription? Yes If no, delete pharmacy and type the correct one.   Has the prescription been filled recently? Yes  Is the patient out of the medication? Yes  Has the patient been seen for an appointment in the last year OR does the patient have an upcoming appointment? Yes  Can we respond through MyChart? Yes  Agent: Please be advised that Rx refills may take up to 3 business days. We ask that you follow-up with your pharmacy.

## 2024-06-07 NOTE — Telephone Encounter (Signed)
 This should be fine.  He has been on both of these for an extended period of time with normal electrolytes.  No concern and no reason to change medications.

## 2024-06-07 NOTE — Telephone Encounter (Signed)
 Received fax from [facility] stating Drug Interaction with Losartan  and Spironolactone .  In can increase potassium level.  Please advise.

## 2024-06-07 NOTE — Telephone Encounter (Signed)
 Left message on [facility] pharmacy voicemail with message below from Dr. Watt about drug interaction.

## 2024-07-12 ENCOUNTER — Telehealth (HOSPITAL_COMMUNITY): Payer: Self-pay

## 2024-07-12 NOTE — Telephone Encounter (Signed)
 Paper work faxed on 07/12/24 to the city of [place] at [phone]. My chart message sent to patient to inform him

## 2024-08-25 NOTE — Progress Notes (Signed)
 Taiquan Campanaro T. Rakeisha Nyce, MD, CAQ Sports Medicine [facility] at [facility] [address] [place], [address]  Phone: [phone]  FAX: [phone]  TU BAYLE - 58 y.o. male  MRN [id number]  Date of Birth: 09-Aug-1966  Date: 08/28/2024  PCP: Watt Mirza, MD  Referral: Watt Mirza, MD  Chief Complaint  Patient presents with   Diabetes   Subjective:   Eric Frederick is a 58 y.o. very pleasant male patient with Body mass index is 42.24 kg/m. who presents with the following:  Discussed the use of AI scribe software for clinical note transcription with the patient, who gave verbal consent to proceed.  He has really done a great job with his diabetes.  He has lost 75 pounds.  Diabetes Mellitus: Tolerating Medications: yes Compliance with diet: fair, Body mass index is 42.24 kg/m. Exercise: minimal / intermittent Avg blood sugars at home: not checking Foot problems: none Hypoglycemia: none No nausea, vomitting, blurred vision, polyuria.  Lab Results  Component Value Date   HGBA1C 5.1 08/28/2024   HGBA1C 4.9 04/03/2024   HGBA1C 6.2 11/15/2023   Lab Results  Component Value Date   MICROALBUR 3.1 (H) 07/28/2010   LDLCALC 120 (H) 11/15/2023   CREATININE 0.73 04/12/2024    Wt Readings from Last 3 Encounters:  08/28/24 248 lb (112.5 kg)  05/22/24 262 lb (118.8 kg)  05/17/24 263 lb (119.3 kg)    Follow-up morbid obesity History of Present Illness     Review of Systems is noted in the HPI, as appropriate  Objective:   BP 100/60   Pulse 88   Temp (!) 97.1 F (36.2 C) (Temporal)   Ht 5' 4.25 (1.632 m)   Wt 248 lb (112.5 kg)   SpO2 98%   BMI 42.24 kg/m   GEN: No acute distress; alert,appropriate. CV: RRR, no m/g/r  PULM: Breathing comfortably in no respiratory distress PSYCH: Normally interactive.  Scaling at elbows and hands Physical Exam   Laboratory and Imaging Data:  Assessment and Plan:     ICD-10-CM   1.  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 Severe obesity (BMI >= 40) (HCC)  E66.01     3. Need for influenza vaccination  Z23 Flu vaccine trivalent PF, 6mos and older(Flulaval,Afluria,Fluarix,Fluzone)    4. Chronic diastolic (congestive) heart failure (HCC)  I50.32     5. Need for COVID-19 vaccine  Z23 COVID-19 mRNA vaccine, Pfizer, (COMIRNATY) syringe    6. Eczema, unspecified type  L30.9      Diabetes, stable and doing well.  We can stop his metformin .  He is also on Jardiance , he does have grade 1 diastolic dysfunction we could consider stopping Jardiance  in the future.  Eczema, we will give him some betamethasone  and he can also use Jardiance  in between Betamethasone  boluses.  Medication Management during today's office visit: Meds ordered this encounter  Medications   COVID-19 mRNA vaccine, Pfizer, (COMIRNATY) syringe    Sig: Inject 0.3 mLs into the muscle once for 1 dose.    Dispense:  0.3 mL    Refill:  0   triamcinolone  cream (KENALOG ) 0.1 %    Sig: Apply 1 Application topically 2 (two) times daily.    Dispense:  454 g    Refill:  1   betamethasone  valerate ointment (VALISONE ) 0.1 %    Sig: Apply 1 Application  topically 2 (two) times daily.    Dispense:  45 g    Refill:  2   apixaban  (ELIQUIS ) 5 MG TABS tablet    Sig: Take 1 tablet (5 mg total) by mouth 2 (two) times daily.    Dispense:  180 tablet    Refill:  3   carvedilol  (COREG ) 12.5 MG tablet    Sig: Take 1 tablet (12.5 mg total) by mouth 2 (two) times daily with a meal.    Dispense:  180 tablet    Refill:  3   spironolactone  (ALDACTONE ) 25 MG tablet    Sig: Take 1 tablet (25 mg total) by mouth daily.    Dispense:  90 tablet    Refill:  3   Medications Discontinued During This Encounter  Medication Reason   tirzepatide  (MOUNJARO ) 12.5 MG/0.5ML Pen Completed Course   fluconazole  (DIFLUCAN ) 150 MG tablet     metFORMIN  (GLUCOPHAGE ) 500 MG tablet    apixaban  (ELIQUIS ) 5 MG TABS tablet Reorder   carvedilol  (COREG ) 12.5 MG tablet Reorder   spironolactone  (ALDACTONE ) 25 MG tablet Reorder    Orders placed today for conditions managed today: Orders Placed This Encounter  Procedures   Flu vaccine trivalent PF, 6mos and older(Flulaval,Afluria,Fluarix,Fluzone)   POCT glycosylated hemoglobin (Hb A1C)    Disposition: Return in about 6 months (around 02/25/2025) for Complete physical.  [employer] One speech-to-text software was used for transcription in this dictation.  Possible transcriptional errors can occur using Animal nutritionist.   Signed,  Jacques DASEN. Harshita Bernales, MD   Outpatient Encounter Medications as of 08/28/2024  Medication Sig   betamethasone  valerate ointment (VALISONE ) 0.1 % Apply 1 Application topically 2 (two) times daily.   CINNAMON PO Take 1 tablet by mouth daily.   COVID-19 mRNA vaccine, Pfizer, (COMIRNATY) syringe Inject 0.3 mLs into the muscle once for 1 dose.   empagliflozin  (JARDIANCE ) 10 MG TABS tablet Take 1 tablet (10 mg total) by mouth daily.   furosemide  (LASIX ) 40 MG tablet Take 1 tablet (40 mg total) by mouth daily as needed for edema or fluid. for weight gain of 3lb in 1 day or 5lb in 1 week   losartan  (COZAAR ) 50 MG tablet Take 1 tablet (50 mg total) by mouth daily.   Multiple Vitamins-Minerals (MULTIVITAMIN MEN 50+) TABS Take 1 tablet by mouth daily.   omeprazole  (PRILOSEC) 40 MG capsule Take 1 capsule (40 mg total) by mouth daily.   sildenafil  (VIAGRA ) 100 MG tablet Take 0.5-1 tablets (50-100 mg total) by mouth daily as needed for erectile dysfunction.   tirzepatide  (MOUNJARO ) 15 MG/0.5ML Pen Inject 15 mg into the skin once a week.   triamcinolone  cream (KENALOG ) 0.1 % Apply 1 Application topically 2 (two) times daily.   [DISCONTINUED] apixaban  (ELIQUIS ) 5 MG TABS tablet Take 1 tablet (5 mg total) by mouth 2 (two) times daily.   [DISCONTINUED] carvedilol  (COREG ) 12.5  MG tablet Take 1 tablet (12.5 mg total) by mouth 2 (two) times daily with a meal.   [DISCONTINUED] fluconazole  (DIFLUCAN ) 150 MG tablet Take 2 tablets (300 mg total) by mouth once a week.   [DISCONTINUED] metFORMIN  (GLUCOPHAGE ) 500 MG tablet Take 1 tablet (500 mg total) by mouth 2 (two) times daily with a meal.   [DISCONTINUED] spironolactone  (ALDACTONE ) 25 MG tablet Take 1 tablet (25 mg total) by mouth daily.   apixaban  (ELIQUIS ) 5 MG TABS tablet Take 1 tablet (5 mg total) by mouth 2 (two) times daily.   carvedilol  (COREG ) 12.5 MG tablet  Take 1 tablet (12.5 mg total) by mouth 2 (two) times daily with a meal.   spironolactone  (ALDACTONE ) 25 MG tablet Take 1 tablet (25 mg total) by mouth daily.   [DISCONTINUED] tirzepatide  (MOUNJARO ) 12.5 MG/0.5ML Pen Inject 12.5 mg into the skin once a week.   No facility-administered encounter medications on file as of 08/28/2024.

## 2024-08-28 ENCOUNTER — Other Ambulatory Visit: Payer: Self-pay | Admitting: Family Medicine

## 2024-08-28 ENCOUNTER — Encounter: Payer: Self-pay | Admitting: Family Medicine

## 2024-08-28 ENCOUNTER — Ambulatory Visit: Admitting: Family Medicine

## 2024-08-28 VITALS — BP 100/60 | HR 88 | Temp 97.1°F | Ht 64.25 in | Wt 248.0 lb

## 2024-08-28 DIAGNOSIS — Z23 Encounter for immunization: Secondary | ICD-10-CM | POA: Diagnosis not present

## 2024-08-28 DIAGNOSIS — Z6841 Body Mass Index (BMI) 40.0 and over, adult: Secondary | ICD-10-CM

## 2024-08-28 DIAGNOSIS — N521 Erectile dysfunction due to diseases classified elsewhere: Secondary | ICD-10-CM

## 2024-08-28 DIAGNOSIS — Z7984 Long term (current) use of oral hypoglycemic drugs: Secondary | ICD-10-CM | POA: Diagnosis not present

## 2024-08-28 DIAGNOSIS — E1159 Type 2 diabetes mellitus with other circulatory complications: Secondary | ICD-10-CM

## 2024-08-28 DIAGNOSIS — I5032 Chronic diastolic (congestive) heart failure: Secondary | ICD-10-CM

## 2024-08-28 DIAGNOSIS — L309 Dermatitis, unspecified: Secondary | ICD-10-CM

## 2024-08-28 LAB — POCT GLYCOSYLATED HEMOGLOBIN (HGB A1C): Hemoglobin A1C: 5.1 % (ref 4.0–5.6)

## 2024-08-28 MED ORDER — BETAMETHASONE VALERATE 0.1 % EX OINT: 1.0000 | TOPICAL_OINTMENT | Freq: Two times a day (BID) | CUTANEOUS | 2 refills | Status: DC

## 2024-08-28 MED ORDER — CARVEDILOL 12.5 MG PO TABS: 12.5000 mg | ORAL_TABLET | Freq: Two times a day (BID) | ORAL | 3 refills | Status: AC

## 2024-08-28 MED ORDER — APIXABAN 5 MG PO TABS: 5.0000 mg | ORAL_TABLET | Freq: Two times a day (BID) | ORAL | 3 refills | Status: AC

## 2024-08-28 MED ORDER — TRIAMCINOLONE ACETONIDE 0.1 % EX CREA: 1.0000 | TOPICAL_CREAM | Freq: Two times a day (BID) | CUTANEOUS | 1 refills | Status: DC

## 2024-08-28 MED ORDER — COVID-19 MRNA VAC-TRIS(PFIZER) 30 MCG/0.3ML IM SUSY: 0.3000 mL | PREFILLED_SYRINGE | Freq: Once | INTRAMUSCULAR | 0 refills | Status: AC

## 2024-08-28 MED ORDER — SPIRONOLACTONE 25 MG PO TABS: 25.0000 mg | ORAL_TABLET | Freq: Every day | ORAL | 3 refills | Status: AC

## 2024-08-28 NOTE — Patient Instructions (Signed)
 Stop Metformin

## 2024-08-28 NOTE — Telephone Encounter (Signed)
 Yes

## 2024-08-28 NOTE — Telephone Encounter (Signed)
 Betameth Val only comes as a cream in the 0.1%. Is it ok to switch?

## 2024-10-25 ENCOUNTER — Other Ambulatory Visit: Payer: Self-pay | Admitting: Family Medicine

## 2024-10-25 NOTE — Telephone Encounter (Signed)
 Last office visit 08/28/24 for DM.  Last refilled 08/28/24 for 454 g with 1 refill.  Next Appt: CPe 02/26/25

## 2024-11-02 ENCOUNTER — Other Ambulatory Visit: Payer: Self-pay | Admitting: Family Medicine

## 2024-11-15 ENCOUNTER — Other Ambulatory Visit: Payer: Self-pay | Admitting: Family Medicine

## 2024-11-23 ENCOUNTER — Other Ambulatory Visit: Payer: Self-pay | Admitting: Family Medicine

## 2024-11-24 ENCOUNTER — Other Ambulatory Visit: Payer: Self-pay | Admitting: Family Medicine

## 2024-11-25 ENCOUNTER — Other Ambulatory Visit: Payer: Self-pay | Admitting: Family Medicine

## 2024-11-25 NOTE — Telephone Encounter (Signed)
°  Pharmacy comment: per insurance, we need rx that indicated how many days 454g should last the patient.

## 2024-11-25 NOTE — Telephone Encounter (Signed)
 Last office visit 08/28/2024 for DM.  Last refilled 10/25/24 for 454 g with no refills.  Next Appt: CPE 02/26/2025.

## 2024-11-26 ENCOUNTER — Other Ambulatory Visit: Payer: Self-pay | Admitting: Family Medicine

## 2025-02-19 ENCOUNTER — Other Ambulatory Visit

## 2025-02-26 ENCOUNTER — Encounter: Admitting: Family Medicine
